# Patient Record
Sex: Female | Born: 1935 | Race: Black or African American | Hispanic: No | Marital: Married | Smoking: Never smoker
Health system: Southern US, Community
[De-identification: ages and names within clinical notes are randomized; demographics above are authoritative.]

## PROBLEM LIST (undated history)

## (undated) DIAGNOSIS — J449 Chronic obstructive pulmonary disease, unspecified: Secondary | ICD-10-CM

## (undated) DIAGNOSIS — I5020 Unspecified systolic (congestive) heart failure: Secondary | ICD-10-CM

## (undated) DIAGNOSIS — M109 Gout, unspecified: Secondary | ICD-10-CM

## (undated) DIAGNOSIS — E079 Disorder of thyroid, unspecified: Secondary | ICD-10-CM

## (undated) DIAGNOSIS — E119 Type 2 diabetes mellitus without complications: Secondary | ICD-10-CM

## (undated) DIAGNOSIS — I10 Essential (primary) hypertension: Secondary | ICD-10-CM

## (undated) DIAGNOSIS — I2699 Other pulmonary embolism without acute cor pulmonale: Secondary | ICD-10-CM

## (undated) DIAGNOSIS — N183 Chronic kidney disease, stage 3 unspecified: Secondary | ICD-10-CM

---

## 1998-12-11 ENCOUNTER — Other Ambulatory Visit: Admission: RE | Admit: 1998-12-11 | Discharge: 1998-12-11 | Payer: Self-pay

## 2000-11-16 ENCOUNTER — Encounter: Payer: Self-pay | Admitting: Emergency Medicine

## 2000-11-16 ENCOUNTER — Emergency Department (HOSPITAL_COMMUNITY): Admission: EM | Admit: 2000-11-16 | Discharge: 2000-11-16 | Payer: Self-pay | Admitting: *Deleted

## 2001-09-05 ENCOUNTER — Emergency Department (HOSPITAL_COMMUNITY): Admission: EM | Admit: 2001-09-05 | Discharge: 2001-09-05 | Payer: Self-pay | Admitting: Emergency Medicine

## 2002-12-25 ENCOUNTER — Emergency Department (HOSPITAL_COMMUNITY): Admission: EM | Admit: 2002-12-25 | Discharge: 2002-12-25 | Payer: Self-pay | Admitting: Emergency Medicine

## 2005-06-12 ENCOUNTER — Emergency Department (HOSPITAL_COMMUNITY): Admission: EM | Admit: 2005-06-12 | Discharge: 2005-06-12 | Payer: Self-pay | Admitting: Emergency Medicine

## 2007-11-21 ENCOUNTER — Emergency Department (HOSPITAL_COMMUNITY): Admission: EM | Admit: 2007-11-21 | Discharge: 2007-11-21 | Payer: Self-pay | Admitting: Emergency Medicine

## 2008-01-18 DIAGNOSIS — M51369 Other intervertebral disc degeneration, lumbar region without mention of lumbar back pain or lower extremity pain: Secondary | ICD-10-CM | POA: Insufficient documentation

## 2008-01-18 DIAGNOSIS — M5136 Other intervertebral disc degeneration, lumbar region: Secondary | ICD-10-CM | POA: Insufficient documentation

## 2008-01-18 DIAGNOSIS — M159 Polyosteoarthritis, unspecified: Secondary | ICD-10-CM | POA: Insufficient documentation

## 2008-02-02 DIAGNOSIS — L659 Nonscarring hair loss, unspecified: Secondary | ICD-10-CM | POA: Insufficient documentation

## 2008-03-22 DIAGNOSIS — D638 Anemia in other chronic diseases classified elsewhere: Secondary | ICD-10-CM | POA: Insufficient documentation

## 2008-04-07 DIAGNOSIS — G4733 Obstructive sleep apnea (adult) (pediatric): Secondary | ICD-10-CM | POA: Insufficient documentation

## 2008-05-30 DIAGNOSIS — M7580 Other shoulder lesions, unspecified shoulder: Secondary | ICD-10-CM

## 2008-05-30 DIAGNOSIS — M25819 Other specified joint disorders, unspecified shoulder: Secondary | ICD-10-CM | POA: Insufficient documentation

## 2008-06-16 DIAGNOSIS — E785 Hyperlipidemia, unspecified: Secondary | ICD-10-CM | POA: Insufficient documentation

## 2009-04-16 DIAGNOSIS — K5900 Constipation, unspecified: Secondary | ICD-10-CM | POA: Insufficient documentation

## 2009-05-02 DIAGNOSIS — D239 Other benign neoplasm of skin, unspecified: Secondary | ICD-10-CM | POA: Insufficient documentation

## 2009-05-02 DIAGNOSIS — E039 Hypothyroidism, unspecified: Secondary | ICD-10-CM | POA: Insufficient documentation

## 2009-06-17 ENCOUNTER — Emergency Department (HOSPITAL_COMMUNITY): Admission: EM | Admit: 2009-06-17 | Discharge: 2009-06-17 | Payer: Self-pay | Admitting: Family Medicine

## 2009-11-29 DIAGNOSIS — I10 Essential (primary) hypertension: Secondary | ICD-10-CM | POA: Insufficient documentation

## 2010-01-06 ENCOUNTER — Emergency Department (HOSPITAL_COMMUNITY): Admission: EM | Admit: 2010-01-06 | Discharge: 2010-01-06 | Payer: Self-pay | Admitting: Emergency Medicine

## 2010-01-06 ENCOUNTER — Ambulatory Visit: Payer: Self-pay | Admitting: Internal Medicine

## 2010-01-06 ENCOUNTER — Inpatient Hospital Stay (HOSPITAL_COMMUNITY): Admission: EM | Admit: 2010-01-06 | Discharge: 2010-01-10 | Payer: Self-pay | Admitting: Emergency Medicine

## 2010-11-12 DIAGNOSIS — E1122 Type 2 diabetes mellitus with diabetic chronic kidney disease: Secondary | ICD-10-CM | POA: Insufficient documentation

## 2010-11-12 DIAGNOSIS — N183 Chronic kidney disease, stage 3 unspecified: Secondary | ICD-10-CM | POA: Insufficient documentation

## 2011-02-23 LAB — DIFFERENTIAL
Basophils Absolute: 0 K/uL (ref 0.0–0.1)
Basophils Relative: 1 % (ref 0–1)
Eosinophils Absolute: 0.2 K/uL (ref 0.0–0.7)
Eosinophils Relative: 4 % (ref 0–5)
Lymphocytes Relative: 41 % (ref 12–46)
Lymphs Abs: 1.9 K/uL (ref 0.7–4.0)
Monocytes Absolute: 0.8 K/uL (ref 0.1–1.0)
Monocytes Relative: 18 % — ABNORMAL HIGH (ref 3–12)
Neutro Abs: 1.7 K/uL (ref 1.7–7.7)
Neutrophils Relative %: 36 % — ABNORMAL LOW (ref 43–77)

## 2011-02-23 LAB — POCT CARDIAC MARKERS
CKMB, poc: 3 ng/mL (ref 1.0–8.0)
Myoglobin, poc: 428 ng/mL (ref 12–200)
Troponin i, poc: <0.05 ng/mL (ref 0.00–0.09)

## 2011-02-23 LAB — CBC
HCT: 31.8 % — ABNORMAL LOW (ref 36.0–46.0)
HCT: 36.7 % (ref 36.0–46.0)
Hemoglobin: 10.3 g/dL — ABNORMAL LOW (ref 12.0–15.0)
Hemoglobin: 12.4 g/dL (ref 12.0–15.0)
MCHC: 32.3 g/dL (ref 30.0–36.0)
MCV: 90.3 fL (ref 78.0–100.0)
MCV: 91.3 fL (ref 78.0–100.0)
RBC: 3.48 MIL/uL — ABNORMAL LOW (ref 3.87–5.11)
RBC: 4.07 MIL/uL (ref 3.87–5.11)
WBC: 4.6 10*3/uL (ref 4.0–10.5)
WBC: 6 10*3/uL (ref 4.0–10.5)

## 2011-02-23 LAB — POCT I-STAT, CHEM 8
Chloride: 107 mEq/L (ref 96–112)
Creatinine, Ser: 2.4 mg/dL — ABNORMAL HIGH (ref 0.4–1.2)
Glucose, Bld: 108 mg/dL — ABNORMAL HIGH (ref 70–99)
HCT: 34 % — ABNORMAL LOW (ref 36.0–46.0)
Potassium: 4.8 mEq/L (ref 3.5–5.1)

## 2011-02-23 LAB — CARDIAC PANEL(CRET KIN+CKTOT+MB+TROPI)
CK, MB: 2.5 ng/mL (ref 0.3–4.0)
CK, MB: 3.1 ng/mL (ref 0.3–4.0)
Relative Index: 1.1 (ref 0.0–2.5)
Relative Index: 1.3 (ref 0.0–2.5)
Total CK: 223 U/L — ABNORMAL HIGH (ref 7–177)
Total CK: 245 U/L — ABNORMAL HIGH (ref 7–177)

## 2011-02-23 LAB — BASIC METABOLIC PANEL WITH GFR
BUN: 33 mg/dL — ABNORMAL HIGH (ref 6–23)
CO2: 27 meq/L (ref 19–32)
Calcium: 8.9 mg/dL (ref 8.4–10.5)
Chloride: 102 meq/L (ref 96–112)
Creatinine, Ser: 1.96 mg/dL — ABNORMAL HIGH (ref 0.4–1.2)
GFR calc non Af Amer: 25 mL/min — ABNORMAL LOW
Glucose, Bld: 86 mg/dL (ref 70–99)
Potassium: 4.7 meq/L (ref 3.5–5.1)
Sodium: 137 meq/L (ref 135–145)

## 2011-02-23 LAB — GLUCOSE, CAPILLARY
Glucose-Capillary: 101 mg/dL — ABNORMAL HIGH (ref 70–99)
Glucose-Capillary: 101 mg/dL — ABNORMAL HIGH (ref 70–99)
Glucose-Capillary: 103 mg/dL — ABNORMAL HIGH (ref 70–99)
Glucose-Capillary: 115 mg/dL — ABNORMAL HIGH (ref 70–99)

## 2011-02-23 LAB — CK TOTAL AND CKMB (NOT AT ARMC)
CK, MB: 4.7 ng/mL — ABNORMAL HIGH (ref 0.3–4.0)
Total CK: 307 U/L — ABNORMAL HIGH (ref 7–177)

## 2011-02-23 LAB — LIPID PANEL
Total CHOL/HDL Ratio: 1.9 RATIO
Triglycerides: 65 mg/dL (ref <150)
VLDL: 13 mg/dL (ref 0–40)

## 2011-02-23 LAB — PROTIME-INR
INR: 1.03 (ref 0.00–1.49)
Prothrombin Time: 13.4 s (ref 11.6–15.2)

## 2011-02-23 LAB — BRAIN NATRIURETIC PEPTIDE: Pro B Natriuretic peptide (BNP): <30 pg/mL (ref 0.0–100.0)

## 2011-02-23 LAB — APTT: aPTT: 23 s — ABNORMAL LOW (ref 24–37)

## 2011-02-23 LAB — TROPONIN I: Troponin I: 0.02 ng/mL (ref 0.00–0.06)

## 2011-02-23 LAB — T4, FREE: Free T4: 1.28 ng/dL (ref 0.80–1.80)

## 2011-02-26 LAB — DIFFERENTIAL
Basophils Absolute: 0 10*3/uL (ref 0.0–0.1)
Eosinophils Absolute: 0.2 10*3/uL (ref 0.0–0.7)
Eosinophils Relative: 4 % (ref 0–5)
Lymphocytes Relative: 54 % — ABNORMAL HIGH (ref 12–46)
Lymphs Abs: 2.8 10*3/uL (ref 0.7–4.0)
Neutrophils Relative %: 22 % — ABNORMAL LOW (ref 43–77)

## 2011-02-26 LAB — BASIC METABOLIC PANEL
BUN: 26 mg/dL — ABNORMAL HIGH (ref 6–23)
BUN: 28 mg/dL — ABNORMAL HIGH (ref 6–23)
Calcium: 9.3 mg/dL (ref 8.4–10.5)
Chloride: 101 mEq/L (ref 96–112)
Creatinine, Ser: 1.28 mg/dL — ABNORMAL HIGH (ref 0.4–1.2)
Creatinine, Ser: 1.4 mg/dL — ABNORMAL HIGH (ref 0.4–1.2)
GFR calc Af Amer: 49 mL/min — ABNORMAL LOW (ref >60)
GFR calc non Af Amer: 36 mL/min — ABNORMAL LOW (ref >60)
Glucose, Bld: 106 mg/dL — ABNORMAL HIGH (ref 70–99)

## 2011-02-26 LAB — CBC
MCHC: 32.6 g/dL (ref 30.0–36.0)
MCV: 91.8 fL (ref 78.0–100.0)
Platelets: 169 10*3/uL (ref 150–400)
Platelets: 185 10*3/uL (ref 150–400)
RDW: 14.8 % (ref 11.5–15.5)
WBC: 5 10*3/uL (ref 4.0–10.5)
WBC: 5.1 10*3/uL (ref 4.0–10.5)

## 2011-02-26 LAB — GLUCOSE, CAPILLARY
Glucose-Capillary: 100 mg/dL — ABNORMAL HIGH (ref 70–99)
Glucose-Capillary: 107 mg/dL — ABNORMAL HIGH (ref 70–99)
Glucose-Capillary: 107 mg/dL — ABNORMAL HIGH (ref 70–99)
Glucose-Capillary: 118 mg/dL — ABNORMAL HIGH (ref 70–99)
Glucose-Capillary: 161 mg/dL — ABNORMAL HIGH (ref 70–99)
Glucose-Capillary: 162 mg/dL — ABNORMAL HIGH (ref 70–99)

## 2011-02-26 LAB — PHOSPHORUS: Phosphorus: 3.7 mg/dL (ref 2.3–4.6)

## 2011-09-15 LAB — I-STAT 8, (EC8 V) (CONVERTED LAB)
Acid-Base Excess: 4 — ABNORMAL HIGH
BUN: 22
Chloride: 102
HCT: 39
Operator id: 235561
Potassium: 4.4

## 2011-09-15 LAB — POCT I-STAT CREATININE: Creatinine, Ser: 1.1

## 2012-07-19 DIAGNOSIS — M545 Low back pain, unspecified: Secondary | ICD-10-CM | POA: Insufficient documentation

## 2012-09-08 DIAGNOSIS — M48062 Spinal stenosis, lumbar region with neurogenic claudication: Secondary | ICD-10-CM | POA: Insufficient documentation

## 2012-11-10 DIAGNOSIS — H401131 Primary open-angle glaucoma, bilateral, mild stage: Secondary | ICD-10-CM | POA: Insufficient documentation

## 2012-11-10 DIAGNOSIS — E119 Type 2 diabetes mellitus without complications: Secondary | ICD-10-CM | POA: Insufficient documentation

## 2012-12-14 DIAGNOSIS — G629 Polyneuropathy, unspecified: Secondary | ICD-10-CM | POA: Insufficient documentation

## 2013-04-27 DIAGNOSIS — Z961 Presence of intraocular lens: Secondary | ICD-10-CM | POA: Insufficient documentation

## 2013-07-20 ENCOUNTER — Encounter (HOSPITAL_COMMUNITY): Payer: Self-pay | Admitting: Emergency Medicine

## 2013-07-20 ENCOUNTER — Emergency Department (HOSPITAL_COMMUNITY)
Admission: EM | Admit: 2013-07-20 | Discharge: 2013-07-20 | Disposition: A | Discharge status: Hospice | Payer: Medicare Other | Source: Home / Self Care | Attending: Emergency Medicine | Admitting: Emergency Medicine

## 2013-07-20 ENCOUNTER — Emergency Department (HOSPITAL_COMMUNITY)
Admission: EM | Admit: 2013-07-20 | Discharge: 2013-07-21 | Disposition: A | Discharge status: Home / Self Care | Payer: Medicare Other | Attending: Emergency Medicine | Admitting: Emergency Medicine

## 2013-07-20 ENCOUNTER — Encounter (HOSPITAL_COMMUNITY): Payer: Self-pay | Admitting: Adult Health

## 2013-07-20 DIAGNOSIS — I10 Essential (primary) hypertension: Secondary | ICD-10-CM | POA: Insufficient documentation

## 2013-07-20 DIAGNOSIS — K5900 Constipation, unspecified: Secondary | ICD-10-CM | POA: Insufficient documentation

## 2013-07-20 DIAGNOSIS — Z79899 Other long term (current) drug therapy: Secondary | ICD-10-CM | POA: Insufficient documentation

## 2013-07-20 DIAGNOSIS — R109 Unspecified abdominal pain: Secondary | ICD-10-CM

## 2013-07-20 DIAGNOSIS — R110 Nausea: Secondary | ICD-10-CM | POA: Insufficient documentation

## 2013-07-20 DIAGNOSIS — IMO0001 Reserved for inherently not codable concepts without codable children: Secondary | ICD-10-CM

## 2013-07-20 DIAGNOSIS — E119 Type 2 diabetes mellitus without complications: Secondary | ICD-10-CM | POA: Insufficient documentation

## 2013-07-20 DIAGNOSIS — R1013 Epigastric pain: Secondary | ICD-10-CM | POA: Insufficient documentation

## 2013-07-20 DIAGNOSIS — R142 Eructation: Secondary | ICD-10-CM | POA: Insufficient documentation

## 2013-07-20 DIAGNOSIS — Z7982 Long term (current) use of aspirin: Secondary | ICD-10-CM | POA: Insufficient documentation

## 2013-07-20 DIAGNOSIS — R141 Gas pain: Secondary | ICD-10-CM | POA: Insufficient documentation

## 2013-07-20 DIAGNOSIS — E079 Disorder of thyroid, unspecified: Secondary | ICD-10-CM | POA: Insufficient documentation

## 2013-07-20 HISTORY — DX: Disorder of thyroid, unspecified: E07.9

## 2013-07-20 HISTORY — DX: Type 2 diabetes mellitus without complications: E11.9

## 2013-07-20 HISTORY — DX: Essential (primary) hypertension: I10

## 2013-07-20 LAB — COMPREHENSIVE METABOLIC PANEL
ALT: 13 U/L (ref 0–35)
Alkaline Phosphatase: 29 U/L — ABNORMAL LOW (ref 39–117)
CO2: 29 mEq/L (ref 19–32)
Calcium: 9.3 mg/dL (ref 8.4–10.5)
Chloride: 100 mEq/L (ref 96–112)
GFR calc Af Amer: 30 mL/min — ABNORMAL LOW (ref >90)
GFR calc non Af Amer: 26 mL/min — ABNORMAL LOW (ref >90)
Glucose, Bld: 72 mg/dL (ref 70–99)
Sodium: 140 mEq/L (ref 135–145)
Total Bilirubin: 0.2 mg/dL — ABNORMAL LOW (ref 0.3–1.2)

## 2013-07-20 LAB — CBC WITH DIFFERENTIAL/PLATELET
Basophils Absolute: 0 10*3/uL (ref 0.0–0.1)
Basophils Relative: 0 % (ref 0–1)
Eosinophils Absolute: 0.2 10*3/uL (ref 0.0–0.7)
MCH: 29.9 pg (ref 26.0–34.0)
MCHC: 32.9 g/dL (ref 30.0–36.0)
Neutro Abs: 1.8 10*3/uL (ref 1.7–7.7)
Neutrophils Relative %: 35 % — ABNORMAL LOW (ref 43–77)
Platelets: 168 10*3/uL (ref 150–400)
RDW: 14 % (ref 11.5–15.5)

## 2013-07-20 LAB — POCT I-STAT TROPONIN I

## 2013-07-20 NOTE — ED Notes (Signed)
NURSE FIRST ROUNDS : NURSE EXPLAINED DELAY , WAIT TIME AND PROCESS , ALERT / RESPIRATIONS UNLABORED , DENIES PAIN AT THIS TIME .

## 2013-07-20 NOTE — ED Provider Notes (Addendum)
CSN: [id number]     Arrival date & time 07/20/13  41 History     First MD Initiated Contact with Patient 07/20/13 1928     Chief Complaint  Patient presents with  . Back Pain  . Abdominal Pain  . Flank Pain   (Consider location/radiation/quality/duration/timing/severity/associated sxs/prior Treatment) HPI Comments: Patient presents urgent care this evening brought in by her daughter and she's been having left-sided abdominal pain that radiates to her flank and her back since Monday. She has been taking some medicines for gases she's been having some stomach cramping and pains. During the week she also experienced some intermittent chest pains that has since resolved. But the pain on her stomach in left side of her abdomen have not improve. She denies any urinary symptoms such as increased, frequency burning or pressure. Denies any vomiting or diarrhea as  Patient is a 79 y.o. female presenting with back pain, abdominal pain, and flank pain. The history is provided by the patient.  Back Pain Location:  Lumbar spine Quality:  Aching Radiates to:  Does not radiate Pain severity:  Moderate Pain is:  Worse during the day Onset quality:  Sudden Timing:  Constant Progression:  Worsening Context: not emotional stress and not jumping from heights   Relieved by:  Nothing Ineffective treatments:  None tried Associated symptoms: abdominal pain   Associated symptoms: no bowel incontinence, no chest pain, no dysuria, no fever, no headaches, no numbness, no paresthesias, no pelvic pain, no perianal numbness, no weakness and no weight loss   Abdominal Pain Associated symptoms include abdominal pain. Pertinent negatives include no chest pain and no headaches.  Flank Pain Associated symptoms include abdominal pain. Pertinent negatives include no chest pain and no headaches.    History reviewed. No pertinent past medical history. No past surgical history on file. No family history on  file. History  Substance Use Topics  . Smoking status: Not on file  . Smokeless tobacco: Not on file  . Alcohol Use: Not on file   OB History   Grav Para Term Preterm Abortions TAB SAB Ect Mult Living                 Review of Systems  Constitutional: Negative for fever, chills, weight loss, activity change and appetite change.  Cardiovascular: Negative for chest pain.  Gastrointestinal: Positive for abdominal pain. Negative for nausea, vomiting, abdominal distention, rectal pain and bowel incontinence.  Genitourinary: Positive for flank pain. Negative for dysuria, frequency and pelvic pain.  Musculoskeletal: Positive for back pain.  Neurological: Negative for weakness, numbness, headaches and paresthesias.    Allergies  Codeine  Home Medications   Current Outpatient Rx  Name  Route  Sig  Dispense  Refill  . acetaminophen (TYLENOL) 325 MG tablet   Oral   Take 650 mg by mouth every 6 (six) hours as needed for pain.         Marland Kitchen albuterol (PROVENTIL HFA;VENTOLIN HFA) 108 (90 BASE) MCG/ACT inhaler   Inhalation   Inhale 2 puffs into the lungs every 6 (six) hours as needed for wheezing.         Marland Kitchen aspirin 81 MG tablet   Oral   Take 81 mg by mouth daily.         Marland Kitchen Brinzolamide-Brimonidine [facility] OP)   Ophthalmic   Apply to eye.         . Cholecalciferol (VITAMIN D-3) 5000 UNITS TABS   Oral   Take by mouth.         Marland Kitchen  clonazePAM (KLONOPIN) 0.5 MG tablet   Oral   Take 0.5 mg by mouth 2 (two) times daily as needed for anxiety.         Marland Kitchen doxazosin (CARDURA) 8 MG tablet   Oral   Take 8 mg by mouth at bedtime.         . DULoxetine (CYMBALTA) 30 MG capsule   Oral   Take 30 mg by mouth daily.         . furosemide (LASIX) 20 MG tablet   Oral   Take 20 mg by mouth.         . gabapentin (NEURONTIN) 100 MG capsule   Oral   Take 100 mg by mouth 3 (three) times daily.         Marland Kitchen glipiZIDE (GLUCOTROL XL) 10 MG 24 hr tablet   Oral   Take 10 mg by  mouth daily.         Marland Kitchen ketorolac (ACULAR) 0.5 % ophthalmic solution      1 drop 4 (four) times daily.         Marland Kitchen levothyroxine (SYNTHROID, LEVOTHROID) 112 MCG tablet   Oral   Take 112 mcg by mouth daily before breakfast.         . multivitamin-iron-minerals-folic acid (CENTRUM) chewable tablet   Oral   Chew 1 tablet by mouth daily.         Marland Kitchen omeprazole (PRILOSEC) 20 MG capsule   Oral   Take 20 mg by mouth daily.         . prednisoLONE acetate (PRED FORTE) 1 % ophthalmic suspension      1 drop 4 (four) times daily.         . simvastatin (ZOCOR) 20 MG tablet   Oral   Take 20 mg by mouth every evening.         . traMADol (ULTRAM) 50 MG tablet   Oral   Take 50 mg by mouth every 6 (six) hours as needed for pain.         . verapamil (VERELAN PM) 360 MG 24 hr capsule   Oral   Take 360 mg by mouth at bedtime.          There were no vitals taken for this visit. Physical Exam  Vitals reviewed. Constitutional: She appears well-nourished.  Cardiovascular: Exam reveals no gallop and no friction rub.   No murmur heard. Pulmonary/Chest: Effort normal.  Abdominal: Bowel sounds are normal. She exhibits distension. She exhibits no mass. There is tenderness. There is no rigidity, no rebound, no guarding, no CVA tenderness and negative Murphy's sign.    Skin: No rash noted. No erythema.    ED Course   Procedures (including critical care time)  Labs Reviewed - No data to display No results found. No diagnosis found.  MDM  Patient presents urgent care complaining of left-sided abdominal pain. Afebrile with a concerning exam was sent to the emergency department for further evaluation with the suspicion of complicated diverticular disease. Do not expect some amount of discomfort on deep palpation from constipation.  Rosana Hoes, MD 07/20/13 1948  Rosana Hoes, MD 07/20/13 2059

## 2013-07-20 NOTE — ED Notes (Signed)
Chart review.

## 2013-07-20 NOTE — ED Notes (Addendum)
Presents with epigastric pain that began Monday radiates in lower abdomen and around left side into left flank and upper back associated with dizziness. Pain is described as sharp and bloated Denies nausea, denies SOB. Reports constipation, last BM today.  Belching and passing gas makes pain better, nothing makes pain worse.

## 2013-07-20 NOTE — ED Notes (Signed)
C/o back pain, abd pain, and flank pain ever since Monday.  For abd pain patient took Nash and gas-x.  Patient states she thinks that she has gas that is related to back, abd, and flank pain.

## 2013-07-21 ENCOUNTER — Emergency Department (HOSPITAL_COMMUNITY): Payer: Medicare Other

## 2013-07-21 ENCOUNTER — Encounter (HOSPITAL_COMMUNITY): Payer: Self-pay | Admitting: Radiology

## 2013-07-21 MED ORDER — MILK AND MOLASSES ENEMA
Freq: Once | RECTAL | Status: AC
  Administered 2013-07-21: 04:00:00 via RECTAL
  Filled 2013-07-21: qty 250

## 2013-07-21 MED ORDER — POLYETHYLENE GLYCOL 3350 17 GM/SCOOP PO POWD: 17.0000 g | Freq: Two times a day (BID) | ORAL | Status: DC

## 2013-07-21 MED ORDER — GI COCKTAIL ~~LOC~~
30.0000 mL | Freq: Once | ORAL | Status: AC
  Administered 2013-07-21: 30 mL via ORAL
  Filled 2013-07-21: qty 30

## 2013-07-21 NOTE — ED Provider Notes (Signed)
CSN: [id number]     Arrival date & time 07/20/13  2003 History     First MD Initiated Contact with Patient 07/20/13 2349     Chief Complaint  Patient presents with  . Abdominal Pain    HPI presents with epigastric pain described as bloating, indigestion, she says she is "full of gas" she finds it difficult to belch but feels better with belching or passing gas. She tried TUMS and Maalox without help. Pain at this time is moderate. She reports constipation she had a small bowel movement today, she's recently been trying prune juice to pass her bowels. Pain is worse in the left lower abdomen radiates to the left flank.     Past Medical History  Diagnosis Date  . Diabetes mellitus without complication   . Hypertension   . Thyroid disease    History reviewed. No pertinent past surgical history. History reviewed. No pertinent family history. History  Substance Use Topics  . Smoking status: Never Smoker   . Smokeless tobacco: Not on file  . Alcohol Use: No   OB History   Grav Para Term Preterm Abortions TAB SAB Ect Mult Living                 Review of Systems At least 10pt or greater review of systems completed and are negative except where specified in the HPI.  Allergies  Codeine  Home Medications   Current Outpatient Rx  Name  Route  Sig  Dispense  Refill  . acetaminophen (TYLENOL) 325 MG tablet   Oral   Take 650 mg by mouth every 6 (six) hours as needed for pain.         Marland Kitchen aspirin 81 MG tablet   Oral   Take 81 mg by mouth daily.         . Brinzolamide-Brimonidine (SIMBRINZA OP)   Both Eyes   Place 1 drop into both eyes 3 (three) times daily.          . Calcium & Magnesium Carbonates (MYLANTA PO)   Oral   Take 30 mL by mouth daily as needed (constipation).         . Cholecalciferol (VITAMIN D-3) 5000 UNITS TABS   Oral   Take 5,000 Units by mouth daily.          . clonazePAM (KLONOPIN) 0.5 MG tablet   Oral   Take 0.5 mg by mouth 2 (two) times  daily as needed for anxiety.         Marland Kitchen doxazosin (CARDURA) 8 MG tablet   Oral   Take 8 mg by mouth daily.          . DULoxetine (CYMBALTA) 30 MG capsule   Oral   Take 30 mg by mouth daily.         . furosemide (LASIX) 20 MG tablet   Oral   Take 20 mg by mouth daily.          Marland Kitchen gabapentin (NEURONTIN) 100 MG capsule   Oral   Take 100 mg by mouth 3 (three) times daily.         Marland Kitchen glipiZIDE (GLUCOTROL XL) 10 MG 24 hr tablet   Oral   Take 10 mg by mouth daily.         Marland Kitchen ketorolac (ACULAR) 0.5 % ophthalmic solution      1 drop 4 (four) times daily.         Marland Kitchen levothyroxine (SYNTHROID, LEVOTHROID) 112 MCG tablet  Oral   Take 112 mcg by mouth daily before breakfast.         . multivitamin-iron-minerals-folic acid (CENTRUM) chewable tablet   Oral   Chew 1 tablet by mouth daily.         Marland Kitchen omeprazole (PRILOSEC) 20 MG capsule   Oral   Take 20 mg by mouth 2 (two) times daily.          . Simethicone (GAS-X PO)   Oral   Take 2 tablets by mouth 2 (two) times daily as needed (gas).         . simvastatin (ZOCOR) 20 MG tablet   Oral   Take 20 mg by mouth every evening.         . traMADol (ULTRAM) 50 MG tablet   Oral   Take 50 mg by mouth every 6 (six) hours as needed for pain.         . verapamil (VERELAN PM) 360 MG 24 hr capsule   Oral   Take 360 mg by mouth at bedtime.         Marland Kitchen albuterol (PROVENTIL HFA;VENTOLIN HFA) 108 (90 BASE) MCG/ACT inhaler   Inhalation   Inhale 2 puffs into the lungs every 6 (six) hours as needed for wheezing.          BP 138/54  Pulse 69  Temp(Src) 97.6 F (36.4 C) (Oral)  Resp 14  Ht 5\' 5"  (1.651 m)  Wt 260 lb (117.935 kg)  BMI 43.27 kg/m2  SpO2 97% Physical Exam  Nursing notes reviewed.  Electronic medical record reviewed. VITAL SIGNS:   Filed Vitals:   07/20/13 2214 07/21/13 0007 07/21/13 0129 07/21/13 0502  BP: 153/57 138/54 166/63 160/98  Pulse: 79 69 71 70  Temp: 97.9 F (36.6 C) 97.6 F (36.4 C)     TempSrc: Oral Oral    Resp: 14  20 16   Height:      Weight:      SpO2: 98% 97% 98% 99%   CONSTITUTIONAL: Awake, oriented, appears non-toxic, hard of hearing HENT: Atraumatic, normocephalic, oral mucosa pink and moist, airway patent. Nares patent without drainage. External ears normal. EYES: Conjunctiva clear, EOMI, PERRLA, left eye exotropia NECK: Trachea midline, non-tender, supple CARDIOVASCULAR: Normal heart rate, Normal rhythm, No murmurs, rubs, gallops PULMONARY/CHEST: Clear to auscultation, no rhonchi, wheezes, or rales. Symmetrical breath sounds. Non-tender. ABDOMINAL: Non-distended, morbidly obese, soft, hypertympanic, non-tender - no rebound or guarding.  BS normal. NEUROLOGIC: Non-focal, moving all four extremities, no gross sensory or motor deficits. EXTREMITIES: No clubbing, cyanosis. 2+ B/L lower extremity edema SKIN: Warm, Dry, No erythema, No rash ED Course   Procedures (including critical care time)  Date: 07/21/2013  Rate: 78  Rhythm: normal sinus rhythm  QRS Axis: Left axis deviation  Intervals: normal  ST/T Wave abnormalities: normal  Conduction Disutrbances: Right bundle branch block  Narrative Interpretation: Right bundle branch block, left axis deviation, otherwise normal sinus rhythm, no evidence of acute or infarction, no EKGs for prior comparison Labs Reviewed  COMPREHENSIVE METABOLIC PANEL - Abnormal; Notable for the following:    BUN 32 (*)    Creatinine, Ser 1.73 (*)    Albumin 3.4 (*)    Alkaline Phosphatase 29 (*)    Total Bilirubin 0.2 (*)    GFR calc non Af Amer 26 (*)    GFR calc Af Amer 30 (*)    All other components within normal limits  CBC WITH DIFFERENTIAL - Abnormal; Notable for the following:  RBC 3.24 (*)    Hemoglobin 9.7 (*)    HCT 29.5 (*)    Neutrophils Relative % 35 (*)    Lymphocytes Relative 53 (*)    All other components within normal limits  LIPASE, BLOOD  POCT I-STAT TROPONIN I   Ct Abdomen Pelvis Wo  Contrast  07/21/2013   *RADIOLOGY REPORT*  Clinical Data: Epigastric pain.  Worsening with dizziness.  CT ABDOMEN AND PELVIS WITHOUT CONTRAST  Technique:  Multidetector CT imaging of the abdomen and pelvis was performed following the standard protocol without intravenous contrast.  Comparison: None.  Findings:  BODY WALL: Unremarkable.  LOWER CHEST:  Mediastinum: Unremarkable.  Lungs/pleura: No consolidation.  ABDOMEN/PELVIS:  Liver: No focal abnormality.  Biliary: No evidence of biliary obstruction or stone.  Pancreas: Unremarkable.  Spleen: Unremarkable.  Adrenals: Unremarkable.  Kidneys and ureters: No hydronephrosis or stone. 1 cm water density lesion in the interpolar left kidney is statistically likely to represent a cyst.  Bladder: Unremarkable.  Bowel: No obstruction. Moderate stool burden.  Normal appendix.  Retroperitoneum: No mass or adenopathy.  Peritoneum: No free fluid or gas.  Reproductive: Unremarkable.  Vascular: Aortic branch vessel atherosclerosis.  OSSEOUS: Osteitis pubis and osteitis ilii condensans. Levoscoliosis with advanced degenerative disc disease in the mid lumbar spine.  IMPRESSION: 1.  No acute intra-abdominal findings. 2.  Moderate stool burden.   Original Report Authenticated By: Jorje Guild   Dg Chest 2 View  07/21/2013   *RADIOLOGY REPORT*  Clinical Data: Epigastric pain.  CHEST - 2 VIEW  Comparison: 01/06/2010.  Findings: Borderline cardiomegaly.  Tortuous and atherosclerotic thoracic aorta. Prominent right suprahilar density, likely normal vascular shadow, especially when compared with prior and contemporaneous CT scanogram.  Interstitial markings are prominent at the bases, but there is no overt edema.  No pleural effusion.  No pneumothorax or focal infiltrate.  Right shoulder arthroplasty.  No acute osseous findings.  No pneumoperitoneum.  IMPRESSION: Mild, likely chronic interstitial changes at the bases.  Negative for pneumonia or overt pulmonary edema.   Original  Report Authenticated By: Jorje Guild   1. Epigastric pain   2. Gas   3. Constipation   4. Nausea     MDM  Debbie Arnold is a 79 y.o. female with extensive medical history presenting with epigastric bloating that she says feels like gas. Obtain EKG and troponin as this patient is an elderly female troponin is negative, EKG is nonischemic, she's been having these symptoms since Tuesday so do not think this is an acute coronary syndrome. She's also not short of breath, I do not suspect a pulmonary embolism. She's not tachycardic, no history of venous thrombolic disease, no recent hemoptysis, immobilization, trauma surgery or cancer treatment.  Patient is having epigastric pain, she does have a history of constipation has a bowel movement in a few days, she's been taking prune juice without relief.    Labs reveal elevated creatinine at 1.73, looking back at the patient's medical history, she does have a history of chronic kidney disease.  No evidence for widened mediastinum, or acute pulmonary disease and chest x-ray. CT of the abdomen and pelvis shows no acute intra-abdominal findings a moderate stool burden.  The patient is likely constipated, gave patient a note in molasses enema with excellent results, patient's pain was resolved she felt much better. No evidence for acute abdominal process. Patient discharged in good condition, she was sent home with MiraLax.  The patient appears reasonably screened and/or stabilized for discharge and I  doubt any other medical condition or other [facility] requiring further screening, evaluation, or treatment in the ED exists or is present at this time prior to discharge.     Rhunette Croft, MD 07/21/13 [phone]

## 2013-07-21 NOTE — ED Notes (Signed)
Pt using restroom. Pt has to have another bowel movement.

## 2013-07-21 NOTE — ED Notes (Signed)
Pt getting dressed with assistance.

## 2013-07-21 NOTE — ED Notes (Signed)
Pt to ED for evaluation of epigastric pain that started on Monday- pt states she feels like she is "full of gas".  Has tried prune juice, Mylanta, and Gas-X at home without relief.  Abdominal tenderness noted upon palpation to left lower quadrant.  States her bowels have been slower to pass, denies any changes in urination.

## 2014-01-04 DIAGNOSIS — F411 Generalized anxiety disorder: Secondary | ICD-10-CM | POA: Insufficient documentation

## 2014-03-14 DIAGNOSIS — H1013 Acute atopic conjunctivitis, bilateral: Secondary | ICD-10-CM | POA: Insufficient documentation

## 2015-05-21 DIAGNOSIS — H5711 Ocular pain, right eye: Secondary | ICD-10-CM | POA: Insufficient documentation

## 2015-06-22 DIAGNOSIS — H209 Unspecified iridocyclitis: Secondary | ICD-10-CM | POA: Insufficient documentation

## 2015-07-03 DIAGNOSIS — H5010 Unspecified exotropia: Secondary | ICD-10-CM | POA: Insufficient documentation

## 2015-07-03 DIAGNOSIS — H1820 Unspecified corneal edema: Secondary | ICD-10-CM | POA: Insufficient documentation

## 2015-08-02 DIAGNOSIS — K219 Gastro-esophageal reflux disease without esophagitis: Secondary | ICD-10-CM | POA: Insufficient documentation

## 2016-08-05 DIAGNOSIS — J449 Chronic obstructive pulmonary disease, unspecified: Secondary | ICD-10-CM | POA: Insufficient documentation

## 2017-01-12 DIAGNOSIS — H40043 Steroid responder, bilateral: Secondary | ICD-10-CM | POA: Insufficient documentation

## 2017-05-28 DIAGNOSIS — M47816 Spondylosis without myelopathy or radiculopathy, lumbar region: Secondary | ICD-10-CM | POA: Insufficient documentation

## 2017-09-20 ENCOUNTER — Emergency Department (HOSPITAL_COMMUNITY): Payer: Medicare Other

## 2017-09-20 ENCOUNTER — Emergency Department (HOSPITAL_COMMUNITY)
Admission: EM | Admit: 2017-09-20 | Discharge: 2017-09-21 | Disposition: A | Discharge status: Home / Self Care | Payer: Medicare Other | Attending: Emergency Medicine | Admitting: Emergency Medicine

## 2017-09-20 ENCOUNTER — Encounter (HOSPITAL_COMMUNITY): Payer: Self-pay | Admitting: Emergency Medicine

## 2017-09-20 DIAGNOSIS — Z79899 Other long term (current) drug therapy: Secondary | ICD-10-CM | POA: Diagnosis not present

## 2017-09-20 DIAGNOSIS — Z7982 Long term (current) use of aspirin: Secondary | ICD-10-CM | POA: Insufficient documentation

## 2017-09-20 DIAGNOSIS — I10 Essential (primary) hypertension: Secondary | ICD-10-CM | POA: Diagnosis not present

## 2017-09-20 DIAGNOSIS — E119 Type 2 diabetes mellitus without complications: Secondary | ICD-10-CM | POA: Insufficient documentation

## 2017-09-20 DIAGNOSIS — R1013 Epigastric pain: Secondary | ICD-10-CM

## 2017-09-20 DIAGNOSIS — E785 Hyperlipidemia, unspecified: Secondary | ICD-10-CM | POA: Insufficient documentation

## 2017-09-20 DIAGNOSIS — K5903 Drug induced constipation: Secondary | ICD-10-CM | POA: Diagnosis not present

## 2017-09-20 DIAGNOSIS — R1030 Lower abdominal pain, unspecified: Secondary | ICD-10-CM | POA: Insufficient documentation

## 2017-09-20 DIAGNOSIS — R198 Other specified symptoms and signs involving the digestive system and abdomen: Secondary | ICD-10-CM | POA: Diagnosis not present

## 2017-09-20 DIAGNOSIS — R1031 Right lower quadrant pain: Secondary | ICD-10-CM

## 2017-09-20 LAB — COMPREHENSIVE METABOLIC PANEL
ALT: 21 U/L (ref 14–54)
ANION GAP: 7 (ref 5–15)
AST: 28 U/L (ref 15–41)
Albumin: 3.3 g/dL — ABNORMAL LOW (ref 3.5–5.0)
Alkaline Phosphatase: 24 U/L — ABNORMAL LOW (ref 38–126)
BUN: 26 mg/dL — ABNORMAL HIGH (ref 6–20)
CO2: 28 mmol/L (ref 22–32)
CREATININE: 1.54 mg/dL — AB (ref 0.44–1.00)
Calcium: 8.9 mg/dL (ref 8.9–10.3)
Chloride: 106 mmol/L (ref 101–111)
GFR, EST AFRICAN AMERICAN: 34 mL/min — AB (ref >60)
GFR, EST NON AFRICAN AMERICAN: 29 mL/min — AB (ref >60)
Glucose, Bld: 187 mg/dL — ABNORMAL HIGH (ref 65–99)
POTASSIUM: 4.7 mmol/L (ref 3.5–5.1)
SODIUM: 141 mmol/L (ref 135–145)
Total Bilirubin: 0.6 mg/dL (ref 0.3–1.2)
Total Protein: 6.2 g/dL — ABNORMAL LOW (ref 6.5–8.1)

## 2017-09-20 LAB — I-STAT TROPONIN, ED: Troponin i, poc: 0.01 ng/mL (ref 0.00–0.08)

## 2017-09-20 LAB — URINALYSIS, ROUTINE W REFLEX MICROSCOPIC
Bilirubin Urine: NEGATIVE
GLUCOSE, UA: NEGATIVE mg/dL
Hgb urine dipstick: NEGATIVE
Ketones, ur: NEGATIVE mg/dL
LEUKOCYTES UA: NEGATIVE
Nitrite: NEGATIVE
PROTEIN: NEGATIVE mg/dL
SPECIFIC GRAVITY, URINE: 1.011 (ref 1.005–1.030)
pH: 5 (ref 5.0–8.0)

## 2017-09-20 LAB — CBC
HCT: 29.4 % — ABNORMAL LOW (ref 36.0–46.0)
HEMOGLOBIN: 9.4 g/dL — AB (ref 12.0–15.0)
MCH: 30 pg (ref 26.0–34.0)
MCHC: 32 g/dL (ref 30.0–36.0)
MCV: 93.9 fL (ref 78.0–100.0)
PLATELETS: 200 10*3/uL (ref 150–400)
RBC: 3.13 MIL/uL — AB (ref 3.87–5.11)
RDW: 16.1 % — ABNORMAL HIGH (ref 11.5–15.5)
WBC: 4.9 10*3/uL (ref 4.0–10.5)

## 2017-09-20 LAB — LIPASE, BLOOD: LIPASE: 25 U/L (ref 11–51)

## 2017-09-20 MED ORDER — IOPAMIDOL (ISOVUE-300) INJECTION 61%
30.0000 mL | Freq: Once | INTRAVENOUS | Status: AC | PRN
  Administered 2017-09-20: 30 mL via ORAL

## 2017-09-20 MED ORDER — MORPHINE SULFATE (PF) 4 MG/ML IV SOLN: 4.0000 mg | Freq: Once | INTRAVENOUS | Status: DC

## 2017-09-20 MED ORDER — IOPAMIDOL (ISOVUE-300) INJECTION 61%
INTRAVENOUS | Status: AC
  Administered 2017-09-20: 30 mL via ORAL
  Filled 2017-09-20: qty 30

## 2017-09-20 MED ORDER — MORPHINE SULFATE (PF) 2 MG/ML IV SOLN
2.0000 mg | Freq: Once | INTRAVENOUS | Status: AC
  Administered 2017-09-20: 2 mg via INTRAVENOUS
  Filled 2017-09-20: qty 1

## 2017-09-20 MED ORDER — SODIUM CHLORIDE 0.9 % IV BOLUS (SEPSIS)
1000.0000 mL | Freq: Once | INTRAVENOUS | Status: AC
  Administered 2017-09-20: 1000 mL via INTRAVENOUS

## 2017-09-20 NOTE — ED Provider Notes (Signed)
[facility] DEPT Provider Note   CSN: [id number] Arrival date & time: 09/20/17  4235     History   Chief Complaint Chief Complaint  Patient presents with  . Abdominal Pain    HPI Debbie Arnold is a 83 y.o. female with h/o obesity, HTN, HLD, DM, CKD, chronic low back pain, constipation presents to ED for evaluation of epigastric "cutting" pain and diffuse lower abdominal pain (R>L). Pain is intermittent, worse with palpation, Ongoing for 1-2 weeks, acutely worsening over the last few days. Also reports changes to BM described as fewer, small, pudding like BMs for the last several weeks. Daughter states pt has had issues with constipation for months, has tried lactulose and magnesium without significant relief.  Last BM today, small, soft, no melena or hematochezia. She is on gabapentin, morphine IR, celexa. Last colonoscopy within 5 years, normal. No previous abdominal surgeries.   No chest pain, SOB, cough, orthopnea, dysuria, hematuria, vaginal bleeding, changes in appetite or PO intake. Chronic LE leg swelling, unchanged today. Chronic LBP, unchanged. Reports falls many months ago. Recent steroid injections not helpful. No recent falls, saddle anesthesia, b/b incontinence or retention, numbness or weakness to extremities.   HPI  Past Medical History:  Diagnosis Date  . Diabetes mellitus without complication ([place])   . Hypertension   . Thyroid disease     There are no active problems to display for this patient.   History reviewed. No pertinent surgical history.  OB History    No data available       Home Medications    Prior to Admission medications   Medication Sig Start Date End Date Taking? Authorizing Provider  acetaminophen (TYLENOL) 325 MG tablet Take 650 mg by mouth every 6 (six) hours as needed for pain.    [provider]  albuterol (PROVENTIL HFA;VENTOLIN HFA) 108 (90 BASE) MCG/ACT inhaler Inhale 2 puffs into the lungs every 6 (six) hours as needed  for wheezing.    [provider]  aspirin 81 MG tablet Take 81 mg by mouth daily.    [provider]  Brinzolamide-Brimonidine [facility] OP) Place 1 drop into both eyes 3 (three) times daily.     [provider]  Calcium & Magnesium Carbonates (MYLANTA PO) Take 30 mL by mouth daily as needed (constipation).    [provider]  Cholecalciferol (VITAMIN D-3) 5000 UNITS TABS Take 5,000 Units by mouth daily.     [provider]  clonazePAM (KLONOPIN) 0.5 MG tablet Take 0.5 mg by mouth 2 (two) times daily as needed for anxiety.    [provider]  doxazosin (CARDURA) 8 MG tablet Take 8 mg by mouth daily.     [provider]  DULoxetine (CYMBALTA) 30 MG capsule Take 30 mg by mouth daily.    [provider]  furosemide (LASIX) 20 MG tablet Take 20 mg by mouth daily.     [provider]  gabapentin (NEURONTIN) 100 MG capsule Take 100 mg by mouth 3 (three) times daily.    [provider]  glipiZIDE (GLUCOTROL XL) 10 MG 24 hr tablet Take 10 mg by mouth daily.    [provider]  ketorolac (ACULAR) 0.5 % ophthalmic solution 1 drop 4 (four) times daily.    [provider]  levothyroxine (SYNTHROID, LEVOTHROID) 112 MCG tablet Take 112 mcg by mouth daily before breakfast.    [provider]  multivitamin-iron-minerals-folic acid (CENTRUM) chewable tablet Chew 1 tablet by mouth daily.  [provider]  omeprazole (PRILOSEC) 20 MG capsule Take 20 mg by mouth 2 (two) times daily.     [provider]  polyethylene glycol powder (GLYCOLAX) powder Take 17 g by mouth 2 (two) times daily. 07/21/13   Arnold, John-Adam, MD  Simethicone (GAS-X PO) Take 2 tablets by mouth 2 (two) times daily as needed (gas).    [provider]  simvastatin (ZOCOR) 20 MG tablet Take 20 mg by mouth every evening.    [provider]  traMADol (ULTRAM) 50 MG tablet Take 50 mg by mouth every 6  (six) hours as needed for pain.    [provider]  verapamil (VERELAN PM) 360 MG 24 hr capsule Take 360 mg by mouth at bedtime.    [provider]    Family History No family history on file.  Social History Social History  Substance Use Topics  . Smoking status: Never Smoker  . Smokeless tobacco: Not on file  . Alcohol use No     Allergies   Codeine   Review of Systems Review of Systems  Constitutional: Negative for appetite change, chills and fever.  Respiratory: Negative for cough and shortness of breath.   Cardiovascular: Positive for leg swelling (chronic, unchanged). Negative for chest pain.  Gastrointestinal: Positive for abdominal pain and constipation. Negative for anal bleeding, blood in stool, diarrhea, nausea, rectal pain and vomiting.  Endocrine: Negative for polydipsia and polyuria.  Genitourinary: Negative for decreased urine volume, difficulty urinating, dysuria, hematuria, vaginal bleeding, vaginal discharge and vaginal pain.  Musculoskeletal: Positive for back pain (low back, chronic) and myalgias (low back).  Skin: Negative for rash.  Neurological: Negative for dizziness and headaches.     Physical Exam Updated Vital Signs BP 100/67 (BP Location: Left Arm)   Pulse (!) 101   Temp 98.4 F (36.9 C) (Oral)   Resp 11   Ht 5\' 5"  (1.651 m)   Wt 122 kg (269 lb)   SpO2 97%   BMI 44.76 kg/m   Physical Exam  Constitutional: She is oriented to person, place, and time. She appears well-developed and well-nourished. No distress.  Obese female. NAD.  HENT:  Head: Normocephalic and atraumatic.  Nose: Nose normal.  Mouth/Throat: No oropharyngeal exudate.  Moist mucous membranes  Eyes: Pupils are equal, round, and reactive to light. Conjunctivae and EOM are normal.  Neck: Normal range of motion. Neck supple.  Cardiovascular: Normal rate, regular rhythm, normal heart sounds and intact distal pulses.   No murmur heard. Pulses:      Radial  pulses are 2+ on the right side, and 2+ on the left side.       Dorsalis pedis pulses are 2+ on the right side, and 2+ on the left side.  No LE edema or calf tenderness  Pulmonary/Chest: Effort normal. No respiratory distress. She has decreased breath sounds in the right lower field and the left lower field. She has no wheezes. She has no rhonchi. She has no rales. She exhibits no tenderness.  Difficult exam given body habitus, decreased lung sounds in lower lobes bilaterally Normal RR and SpO2  Abdominal: Soft. Bowel sounds are normal. She exhibits no distension and no mass. There is tenderness in the right lower quadrant, suprapubic area and left lower quadrant. There is tenderness at McBurney's point.  +RLQ tenderness, +McBurney's No epigastric tenderness currently No G/R/R Decreased bowel sounds  Musculoskeletal: Normal range of motion. She exhibits no deformity.  Pt moving all four extremities without pain  Lymphadenopathy:    She has no cervical adenopathy.  Neurological: She is alert and oriented to person, place, and time. No sensory deficit.  Sensation to light touch intact in hands and feet Strength 5/5 with hand grip and ankle f/e Moving four extremities in coordinated and symmetrical fashion  Skin: Skin is warm and dry. Capillary refill takes less than 2 seconds.  Psychiatric: She has a normal mood and affect. Her behavior is normal. Judgment and thought content normal.  Nursing note and vitals reviewed.    ED Treatments / Results  Labs (all labs ordered are listed, but only abnormal results are displayed) Labs Reviewed  COMPREHENSIVE METABOLIC PANEL - Abnormal; Notable for the following:       Result Value   Glucose, Bld 187 (*)    BUN 26 (*)    Creatinine, Ser 1.54 (*)    Total Protein 6.2 (*)    Albumin 3.3 (*)    Alkaline Phosphatase 24 (*)    GFR calc non Af Amer 29 (*)    GFR calc Af Amer 34 (*)    All other components within normal limits  CBC - Abnormal;  Notable for the following:    RBC 3.13 (*)    Hemoglobin 9.4 (*)    HCT 29.4 (*)    RDW 16.1 (*)    All other components within normal limits  URINALYSIS, ROUTINE W REFLEX MICROSCOPIC - Abnormal; Notable for the following:    Color, Urine STRAW (*)    All other components within normal limits  URINE CULTURE  LIPASE, BLOOD  I-STAT TROPONIN, ED  I-STAT TROPONIN, ED    EKG  EKG Interpretation  Date/Time:  Sunday September 20 2017 18:58:06 EDT Ventricular Rate:  85 PR Interval:    QRS Duration: 170 QT Interval:  445 QTC Calculation: 501 R Axis:   -92 Text Interpretation:  Sinus rhythm Multiple ventricular premature complexes RBBB and LAFB Baseline wander Artifact When compared with ECG of 07/20/2013 Premature ventricular complexes are now Present Confirmed by Francine Graven [phone]) on 09/20/2017 7:17:46 PM       Radiology Dg Chest 2 View  Result Date: 09/20/2017 CLINICAL DATA:  Severe abdominal pain on the right just below the right diaphragm. Nausea and vomiting. Shortness of breath. Hypertension. Previous smoker. EXAM: CHEST  2 VIEW COMPARISON:  07/21/2013 FINDINGS: Cardiac enlargement without vascular congestion. No edema or consolidation. No blunting of costophrenic angles. No pneumothorax. Mediastinal contours appear intact. Calcification of the aorta. Postoperative change in the right shoulder. IMPRESSION: Cardiac enlargement.  No evidence of active pulmonary disease. Electronically Signed   By: Lucienne Capers M.D.   On: 09/20/2017 21:43    Procedures Procedures (including critical care time)  Medications Ordered in ED Medications  sodium chloride 0.9 % bolus 1,000 mL (1,000 mLs Intravenous New Bag/Given 09/20/17 2008)  morphine 2 MG/ML injection 2 mg (2 mg Intravenous Given 09/20/17 2008)  iopamidol (ISOVUE-300) 61 % injection 30 mL (30 mLs Oral Contrast Given 09/20/17 2221)     Initial Impression / Assessment and Plan / ED Course  I have reviewed the triage  vital signs and the nursing notes.  Pertinent labs & imaging results that were available during my care of the patient were reviewed by me and considered in my medical decision making (see chart for details).  Clinical Course as of Sep 20 2300  Nancy Fetter Sep 20, 2017  2111 GFR, Est African American: (!) 34 [CG]  2111 Creatinine: (!) 1.54 [CG]  2111  BUN: (!) 26 [CG]  2111 Hemoglobin: (!) 9.4 [CG]  2111 WBC: 4.9 [CG]    Clinical Course User Index [CG] Kinnie Feil, PA-C   83 year old female with history of HTN, HLD, DM, obesity, CKD, chronic back pain on opioids presents to ED with epigastric and lower abdominal pain (R>L). Recent changes to BMs over the last few weeks, smaller, looser, less frequent BMs. She does have chronic constipation from gabapentin, celexa and opioids that has not improved with recent lactulose and magnesium.   On exam, VS WNL. She has diffuse lower abdominal tenderness mostly at RLQ. No G/R/R. No epigastric tenderness currently. Given epigastric discomfort and multiple risk factors for ACS, will get belly and CP work up.   Final Clinical Impressions(s) / ED Diagnoses   Lab work remarkable for chronic anemia. Elevated creatinine, unchanged from previous. GFR 34. No leukocytosis. Lipase, LFTs, tropx1, u/a unremarkable. Given age, changes in BMs, RLQ pain will get CT scan w/ oral contrast only given GFR. IVF, analgesics started.   Pt will be handed off to oncoming EDP [place] who will f/u on CT A/P and reassess abdomen.  Patient, ED treatment and discharge plan was discussed with supervising physician who is agreeable with plan.   Final diagnoses:  Right lower quadrant abdominal pain  Epigastric abdominal pain  Change in bowel movement    New Prescriptions New Prescriptions   No medications on file     Arlean Hopping 09/20/17 [place], [place], DO 09/20/17 2313

## 2017-09-20 NOTE — ED Triage Notes (Addendum)
Patient BIB son and daughter, c/o upper abdominal pain and constipation x2 days. States last BM two days ago. Patient also c/o right side pain. Reports she got a cortisone shot and the pain has worsened since that time. Family also reports patient had a fall, causing lower back to hurt as well.

## 2017-09-21 LAB — I-STAT TROPONIN, ED: Troponin i, poc: 0.01 ng/mL (ref 0.00–0.08)

## 2017-09-21 NOTE — ED Provider Notes (Signed)
Care assumed from previous provider PA Gibbons. Please see their note for further details to include full history and physical.   Debbie Arnold is a 83 y.o. female with h/o obesity, HTN, HLD, DM, CKD, chronic low back pain, constipation presents to ED for evaluation of epigastric "cutting" pain and diffuse lower abdominal pain (R>L). Pain is intermittent, worse with palpation, Ongoing for 1-2 weeks, acutely worsening over the last few days. Also reports changes to BM described as fewer, small, pudding like BMs for the last several weeks. Daughter states pt has had issues with constipation for months, has tried lactulose and magnesium without significant relief.  Last BM today, small, soft, no melena or hematochezia. She is on gabapentin, morphine IR, celexa. Last colonoscopy within 5 years, normal. No previous abdominal surgeries.   No chest pain, SOB, cough, orthopnea, dysuria, hematuria, vaginal bleeding, changes in appetite or PO intake. Chronic LE leg swelling, unchanged today. Chronic LBP, unchanged. Reports falls many months ago. Recent steroid injections not helpful. No recent falls, saddle anesthesia, b/b incontinence or retention, numbness or weakness to extremities.     Case discussed, plan agreed upon. At time of care handoff awaiting ct scan and trop and then d/c.  IMPRESSION:  1. No CT evidence for acute intra-abdominal or pelvic process.  2. Moderate retained stool within the ascending colon, which may  reflect constipation.  3. Cardiomegaly with trace layering left pleural effusion.  4. Scoliosis with advanced degenerative spondylolysis at T12-L1  through L3-4.       Repeat troponin was negative. Low suspicion for ACS. Labs appear to be at baseline. UA shows no signs of infection.  Abdominal exam reveals no acute findings. Does show moderate retained stool recollecting constipation. Patient is on chronic opioids which is likely causing her symptoms. Patient is requesting  something to help have a bowel movement in the ED. Get did give soapsuds enema and patient had sig bowel movement. She feels much relieved. Repeat abdominal exam is benign. Encouraged continued stool softener an dMiraLAX. Will need close follow-up with PCP or GI doctor.  Pt is hemodynamically stable, in NAD, & able to ambulate in the ED. Evaluation does not show pathology that would require ongoing emergent intervention or inpatient treatment. I explained the diagnosis to the patient. Pain has been managed & has no complaints prior to dc. Pt is comfortable with above plan and is stable for discharge at this time. All questions were answered prior to disposition. Strict return precautions for f/u to the ED were discussed. Encouraged follow up with PCP.          Doristine Devoid, PA-C 09/21/17 [place], [place], DO 09/23/17 1527

## 2017-09-21 NOTE — Discharge Instructions (Signed)
ER workup and imaging has been reassuring. Your symptoms are likely caused by constipation. Her heart shows no signs of heart attack.    One cap of Miralax mixed in a glass of water 1-2 times daily until bowel movements are regular. An over the counter stool softener such as Colace or Senokat S can also be used.  Follow up with your primary physician in regard's to today's visit. Return to ER for vomiting, new or worsening symptoms, any additional concerns.   GETTING TO GOOD BOWEL HEALTH.     The goal: ONE SOFT BOWEL MOVEMENT A DAY!  To have soft, regular bowel movements:  Drink at least 8 tall glasses of water a day.   Take plenty of fiber.  Fiber is the undigested part of plant food that passes into the colon, acting s ?natures broom? to encourage bowel motility and movement.  Fiber can absorb and hold large amounts of water. This results in a larger, bulkier stool, which is soft and easier to pass. Work gradually over several weeks up to 6 servings a day of fiber (25g a day even more if needed) in the form of: Vegetables -- Root (potatoes, carrots, turnips), leafy green (lettuce, salad greens, celery, spinach), or cooked high residue (cabbage, broccoli, etc) Fruit -- Fresh (unpeeled skin & pulp), Dried (prunes, apricots, cherries, etc ),  or stewed ( applesauce)  Whole grain breads, pasta, etc (whole wheat)  Bran cereals  No reading or other relaxing activity while on the toilet. If bowel movements take longer than 5 minutes, you are too constipated

## 2017-09-22 LAB — URINE CULTURE

## 2017-11-23 DIAGNOSIS — E1169 Type 2 diabetes mellitus with other specified complication: Secondary | ICD-10-CM | POA: Insufficient documentation

## 2017-11-23 DIAGNOSIS — E785 Hyperlipidemia, unspecified: Secondary | ICD-10-CM | POA: Insufficient documentation

## 2017-11-23 DIAGNOSIS — I129 Hypertensive chronic kidney disease with stage 1 through stage 4 chronic kidney disease, or unspecified chronic kidney disease: Secondary | ICD-10-CM | POA: Insufficient documentation

## 2017-11-23 DIAGNOSIS — N184 Chronic kidney disease, stage 4 (severe): Secondary | ICD-10-CM | POA: Insufficient documentation

## 2017-11-23 DIAGNOSIS — E669 Obesity, unspecified: Secondary | ICD-10-CM | POA: Insufficient documentation

## 2017-11-24 DIAGNOSIS — E871 Hypo-osmolality and hyponatremia: Secondary | ICD-10-CM | POA: Insufficient documentation

## 2017-11-24 DIAGNOSIS — N179 Acute kidney failure, unspecified: Secondary | ICD-10-CM | POA: Insufficient documentation

## 2018-03-01 ENCOUNTER — Ambulatory Visit: Payer: Medicare Other | Admitting: Podiatry

## 2018-03-01 DIAGNOSIS — M79676 Pain in unspecified toe(s): Secondary | ICD-10-CM | POA: Diagnosis not present

## 2018-03-01 DIAGNOSIS — L84 Corns and callosities: Secondary | ICD-10-CM

## 2018-03-01 DIAGNOSIS — E0843 Diabetes mellitus due to underlying condition with diabetic autonomic (poly)neuropathy: Secondary | ICD-10-CM | POA: Diagnosis not present

## 2018-03-01 DIAGNOSIS — B351 Tinea unguium: Secondary | ICD-10-CM | POA: Diagnosis not present

## 2018-03-03 NOTE — Progress Notes (Signed)
    Subjective: Patient is a 84 y.o. female with PMHx of T2DM presenting to the office today as a new patient with a chief complaint of a painful callus lesion to the left fourth toe that has been present for several months. Applying pressure and walking increase the pain. She has not done anything to treat the symptoms.   Patient also complains of elongated, thickened nails that cause pain while ambulating in shoes. She is unable to trim her own nails. Patient presents today for further treatment and evaluation.  Past Medical History:  Diagnosis Date  . Diabetes mellitus without complication ([place])   . Hypertension   . Thyroid disease     Objective:  Physical Exam General: Alert and oriented x3 in no acute distress  Dermatology: Hyperkeratotic lesion present on the left fourth toe. Pain on palpation with a central nucleated core noted. Skin is warm, dry and supple bilateral lower extremities. Negative for open lesions or macerations. Nails are tender, long, thickened and dystrophic with subungual debris, consistent with onychomycosis, 1-5 bilateral. No signs of infection noted.  Vascular: Palpable pedal pulses bilaterally. No edema or erythema noted. Capillary refill within normal limits.  Neurological: Epicritic and protective threshold grossly intact bilaterally.   Musculoskeletal Exam: Pain on palpation at the keratotic lesion noted. Range of motion within normal limits bilateral. Muscle strength 5/5 in all groups bilateral.  Assessment: 1. Onychodystrophic nails 1-5 bilateral with hyperkeratosis of nails.  2. Onychomycosis of nail due to dermatophyte bilateral 3. Porokeratosis to the left fourth toe   Plan of Care:  #1 Patient evaluated. #2 Excisional debridement of keratoic lesion using a chisel blade was performed without incident.  #3 Dressed with light dressing. #4 Mechanical debridement of nails 1-5 bilaterally performed using a nail nipper. Filed with dremel without  incident.  #5 Patient is to return to the clinic in 3 months.   Edrick Kins, DPM [facility]  Dr. Edrick Kins, [address]                                        [place], [place] [address]                Office [phone]  Fax [phone]

## 2018-05-31 ENCOUNTER — Ambulatory Visit: Payer: Medicare Other | Admitting: Podiatry

## 2018-06-09 ENCOUNTER — Ambulatory Visit: Payer: Medicare Other | Admitting: Podiatry

## 2018-06-09 ENCOUNTER — Encounter: Payer: Self-pay | Admitting: Podiatry

## 2018-06-09 DIAGNOSIS — M79674 Pain in right toe(s): Secondary | ICD-10-CM

## 2018-06-09 DIAGNOSIS — B351 Tinea unguium: Secondary | ICD-10-CM

## 2018-06-09 DIAGNOSIS — L84 Corns and callosities: Secondary | ICD-10-CM | POA: Diagnosis not present

## 2018-06-09 DIAGNOSIS — M79675 Pain in left toe(s): Secondary | ICD-10-CM

## 2018-06-09 DIAGNOSIS — E1142 Type 2 diabetes mellitus with diabetic polyneuropathy: Secondary | ICD-10-CM

## 2018-06-12 NOTE — Progress Notes (Signed)
Subjective: Debbie Arnold is an 84 y.o. y.o. female who presents today for diabetic foot care with  cc of painful, discolored, thick toenails and painful callus/corn which interfere with activities of daily living. Pain is aggravated when wearing enclosed shoe gear. Pain is relieved with periodic professional debridement.  Objective: There were no vitals filed for this visit. Vascular Examination: Capillary refill time <3 seconds x 10 digits Dorsalis pedis and Posterior tibial pulses present b/l No digital hair x 10 digits Skin temperature gradient WNL b/l  Dermatological Examination: Skin with normal turgor, texture and tone b/l Toenails 1-5 b/l discolored, thick, dystrophic with subungual debris and pain with palpation to nailbeds due to thickness of nails. Hyperkeratotic lesion lateral 5th PIPJ right foot, dorsal DIPJ left 4th toe with tenderness to palpation. No edema, no erythema, no flocculence, no drainage nor underlying wound.  Musculoskeletal: Muscle strength 5/5 to all LE muscle groups Adductovarus deformity b/l 5th digit  Neurological: Sensation intact with 10 gram monofilament b/l Vibratory sensation intact b/l  Assessment: 1. Painful onychomycosis toenails 1-5 b/l 2.  Painful preulcerative corn dorsal 4th digit left foot, lateral 5th PIPJ right foot 3. Hammertoe b/l 5th digit 3.  NIDDM with neuropathy  Plan: 1. Continue diabetic foot care principles. Discussed diabetic shoe gear. Pt qualifies due to NIDDM with neuropathy and digital deformities with preucerative corns. Paperwork completed today. 2. Toenails 1-5 b/l were debrided in length and girth without iatrogenic bleeding. 3. Preulcerative lesion pared with sterile chisel blade left 4th toe, right 5th toe 4. Patient to continue soft, supportive shoe gear 5. Patient to report any pedal injuries to medical professional  6. Follow up 3 months.  7. Patient/POA to call should there be a concern in the interim.

## 2018-07-06 ENCOUNTER — Telehealth: Payer: Self-pay | Admitting: Podiatry

## 2018-07-06 NOTE — Telephone Encounter (Signed)
Pt scheduled to come in on 8.5.19 @ 1115

## 2018-07-06 NOTE — Telephone Encounter (Signed)
Returned call from message left by Matthias Hughs and left message for her to call me back to schedule an appt.

## 2018-07-12 ENCOUNTER — Ambulatory Visit: Payer: Medicare Other | Admitting: Orthotics

## 2018-07-12 DIAGNOSIS — B351 Tinea unguium: Secondary | ICD-10-CM

## 2018-07-12 DIAGNOSIS — M79675 Pain in left toe(s): Secondary | ICD-10-CM

## 2018-07-12 DIAGNOSIS — M79674 Pain in right toe(s): Principal | ICD-10-CM

## 2018-07-12 DIAGNOSIS — E1142 Type 2 diabetes mellitus with diabetic polyneuropathy: Secondary | ICD-10-CM

## 2018-07-12 DIAGNOSIS — L84 Corns and callosities: Secondary | ICD-10-CM

## 2018-07-12 NOTE — Progress Notes (Signed)
Patient came in today for fitting and eval for diabetic shoes: Patient' doctor here is [place] y  Patient presents with PN, HT, DM2  Patient was measured with brannock 10W device and cast in foam for custom inserts.  Patient chose [id number]

## 2018-08-03 ENCOUNTER — Ambulatory Visit: Payer: Medicare Other | Admitting: Orthotics

## 2018-08-03 DIAGNOSIS — E1142 Type 2 diabetes mellitus with diabetic polyneuropathy: Secondary | ICD-10-CM

## 2018-08-03 DIAGNOSIS — L84 Corns and callosities: Secondary | ICD-10-CM

## 2018-08-03 NOTE — Progress Notes (Signed)

## 2018-08-16 ENCOUNTER — Ambulatory Visit (INDEPENDENT_AMBULATORY_CARE_PROVIDER_SITE_OTHER): Payer: Medicare Other | Admitting: Orthotics

## 2018-08-16 DIAGNOSIS — L84 Corns and callosities: Secondary | ICD-10-CM

## 2018-08-16 DIAGNOSIS — E0843 Diabetes mellitus due to underlying condition with diabetic autonomic (poly)neuropathy: Secondary | ICD-10-CM

## 2018-08-16 DIAGNOSIS — E1142 Type 2 diabetes mellitus with diabetic polyneuropathy: Secondary | ICD-10-CM | POA: Diagnosis not present

## 2018-08-16 NOTE — Progress Notes (Signed)

## 2018-09-10 ENCOUNTER — Encounter: Payer: Self-pay | Admitting: Podiatry

## 2018-09-10 ENCOUNTER — Ambulatory Visit: Payer: Medicare Other | Admitting: Podiatry

## 2018-09-10 DIAGNOSIS — M79674 Pain in right toe(s): Secondary | ICD-10-CM | POA: Diagnosis not present

## 2018-09-10 DIAGNOSIS — L84 Corns and callosities: Secondary | ICD-10-CM

## 2018-09-10 DIAGNOSIS — B351 Tinea unguium: Secondary | ICD-10-CM

## 2018-09-10 DIAGNOSIS — L89621 Pressure ulcer of left heel, stage 1: Secondary | ICD-10-CM

## 2018-09-10 DIAGNOSIS — E1142 Type 2 diabetes mellitus with diabetic polyneuropathy: Secondary | ICD-10-CM

## 2018-09-10 DIAGNOSIS — M79675 Pain in left toe(s): Secondary | ICD-10-CM | POA: Diagnosis not present

## 2018-09-10 NOTE — Patient Instructions (Signed)
Compression Stockings: ? ?[employer] ?[address] ?[place], [place] [address] ? ?[phone]  ?

## 2018-09-12 NOTE — Progress Notes (Signed)
Subjective: Debbie Arnold is accompanied by her daughter on today.  She presents to clinic for routine foot care follow up. She has diabetic neuropathy, painful mycotic toenails and painful corn left 4th toe. Pain is aggravated when wearing enclosed shoe gear and is relieved with periodic professional debridement.  Today [name] relates painful left heel for the past few weeks. She states she sleeps on her back mostly.   She also says she picked up her diabetic shoes but she feels that they do not fit properly. She has not been wearing them.  She is also requesting a vendor where she can purchase new compression knee highs.  Objective: Vascular Examination: Capillary refill time <3 seconds x 10 digits Dorsalis pedis and Posterior tibial pulses present b/l No digital hair x 10 digits Skin temperature gradient WNL b/l  Dermatological Examination: Skin with normal turgor, texture and tone b/l Toenails 1-5 b/l discolored, thick, dystrophic with subungual debris and pain with palpation to nailbeds due to thickness of nails. Hyperkeratotic lesion dorsal 5th PIPJ right foot, dorsal  DIPJ left 4th digit with tenderness to palpation. No edema, no erythema, no flocculence, no drainage nor underlying wound.  Posterior left heel with hyperkeratosis and hyperpigmentation. There is tenderness to palpation. No flocculence, no erythema, no blistering, no edema.  Musculoskeletal: Muscle strength 5/5 to all LE muscle groups Adductovarus 5th digit b/l  Neurological: Sensation intact with 10 gram monofilament. Vibratory sensation intact  Assessment: 1. Painful onychomycosis toenails 1-5 b/l 2. Corn dorsal 5th PIPJ right foot, dorsal  DIPJ left 4th digit 3. Stage 1 Ulcer posterior left heel 4. NIDDM with Diabetic neuropathy  Plan: 1. Continue diabetic foot care principles.  2. Discussed pressure ulcer and prevention 3. Toenails 1-5 b/l were debrided in length and girth without iatrogenic  bleeding. 4. Hyperkeratotic lesion pared with sterile chisel blade x 2 5. Gently filed posterior right heel with burr. Cleaned with alcohol and applied triple antibiotic ointment. Recommended heel protectors to be worn when in bed. Daughter given information where to purchase them online. She was instructed they would have to be removed for trips to the bathroom during the night. 6. Patient advised to bring diabetic shoes into the office for evaluation. She to continue soft, supportive shoe gear such as Skechers with stretchable uppers in the meantime. 7. Patient given information where to purchase Compression Stockings: Compression Stockings: [employer] [address] [place], [place] [address] [phone] 8. Patient to report any pedal injuries to medical professional  9. Follow up 3 months. Patient/POA to call should there be a concern in the interim.

## 2019-01-13 ENCOUNTER — Ambulatory Visit: Payer: Medicare Other | Admitting: Physical Therapy

## 2019-01-18 ENCOUNTER — Ambulatory Visit: Payer: Medicare Other | Admitting: Physical Therapy

## 2019-01-20 ENCOUNTER — Encounter: Payer: Self-pay | Admitting: Physical Therapy

## 2019-01-20 ENCOUNTER — Other Ambulatory Visit: Payer: Self-pay

## 2019-01-20 ENCOUNTER — Ambulatory Visit: Payer: Medicare Other | Attending: Internal Medicine | Admitting: Physical Therapy

## 2019-01-20 DIAGNOSIS — M545 Low back pain, unspecified: Secondary | ICD-10-CM

## 2019-01-20 DIAGNOSIS — G8929 Other chronic pain: Secondary | ICD-10-CM

## 2019-01-20 DIAGNOSIS — M6281 Muscle weakness (generalized): Secondary | ICD-10-CM | POA: Diagnosis present

## 2019-01-20 DIAGNOSIS — R262 Difficulty in walking, not elsewhere classified: Secondary | ICD-10-CM

## 2019-01-20 NOTE — Therapy (Signed)
[facility] [place], [place], [address] Phone: [phone]   Fax:  [phone]  Physical Therapy Evaluation  Patient Details  Name: Debbie Arnold MRN: [id number] Date of Birth: 28-Oct-1927 Referring Provider (PT): Tempie Hoist, MD   Encounter Date: 01/20/2019  PT End of Session - 01/20/19 2012    Visit Number  1    Number of Visits  12    Date for PT Re-Evaluation  04/20/19    PT Start Time  1440    PT Stop Time  1520    PT Time Calculation (min)  40 min    Activity Tolerance  Patient limited by pain    Behavior During Therapy  [name] for tasks assessed/performed       Past Medical History:  Diagnosis Date  . Diabetes mellitus without complication ([place])   . Hypertension   . Thyroid disease     History reviewed. No pertinent surgical history.  There were no vitals filed for this visit.   Subjective Assessment - 01/20/19 2009    Subjective  Patient arrives to physical therapy with her son with reports of low back pain that began after a fall in November 2019. Patient's son reported she was leaning over a countertop praying when she fell asleep and fell flat on her back. Patient reports there was no fracture to her spine but x-ray showed arthritis. Patient reports she takes 4 tylenols per day to help with the pain and states exercises helps. She states she is independent with ADLs but reports pain with activities. Patient reports pain at worst is 8/10 and pain at best is a 5/10. Patient's goals are to decrease pain, improve strength, improve balance, and improve movement.    Patient is accompained by:  Family member   Son, [name]   Pertinent History  HTN, DM, COPD, chronic kidney disease, bilateral knee replacements, R shoulder replacement    Limitations  Sitting;House hold activities;Walking;Standing    Diagnostic tests  x-ray: arthritis per son report    Patient Stated Goals  improve movement, improve strength, better  balance    Currently in Pain?  Yes    Pain Score  6     Pain Location  Back    Pain Orientation  Left;Lower    Pain Descriptors / Indicators  Sore;Tender    Pain Type  Chronic pain    Pain Onset  More than a month ago    Pain Frequency  Constant    Aggravating Factors   "pushing on the back"    Pain Relieving Factors  "medicine, exercise"         [facility] PT Assessment - 01/20/19 0001      Assessment   Medical Diagnosis  Chronic bilateral low back pain without sciatica    Referring Provider (PT)  Tempie Hoist, MD    Onset Date/Surgical Date  --   November 2019   Prior Therapy  yes      Precautions   Precautions  Fall      Restrictions   Weight Bearing Restrictions  No      Balance Screen   Has the patient fallen in the past 6 months  Yes    How many times?  1    Has the patient had a decrease in activity level because of a fear of falling?   No    Is the patient reluctant to leave their home because of a fear of falling?   No  Home Environment   Living Environment  Private residence    Living Arrangements  Children      Prior Function   Level of Independence  Independent with basic ADLs      ROM / Strength   AROM / PROM / Strength  Strength      Strength   Strength Assessment Site  Hip;Knee    Right/Left Hip  Right;Left    Right Hip Flexion  3/5    Left Hip Flexion  3-/5    Right/Left Knee  Right;Left    Right Knee Flexion  4-/5    Right Knee Extension  4/5    Left Knee Flexion  4-/5    Left Knee Extension  4/5      Palpation   Palpation comment  Tenderness to palpation to lower lumbar spinous processes, left SIJ and upper gluteals and QL      Ambulation/Gait   Assistive device  Straight cane    Gait Pattern  Step-through pattern;Decreased step length - right;Decreased step length - left;Decreased stride length;Antalgic;Wide base of support                Objective measurements completed on examination: See above findings.               PT Education - 01/20/19 2011    Education Details  draw ins, marching, lower trunk rotations    Person(s) Educated  Patient;Child(ren)    Methods  Explanation;Demonstration;Handout;Verbal cues    Comprehension  Verbalized understanding;Returned demonstration          PT Long Term Goals - 01/20/19 2016      PT LONG TERM GOAL #1   Title  Patient will be independent with HEP    Time  6    Period  Weeks    Status  New      PT LONG TERM GOAL #2   Title  Patient will report ability to perform ADLs with low back pain less than 3/10    Time  6    Period  Weeks    Status  New      PT LONG TERM GOAL #3   Title  Patient will demonstrate 4/5 LE strength to improve stability during functional tasks.    Time  6    Period  Weeks    Status  New             Plan - 01/20/19 2014    Clinical Impression Statement  Patient is an 85 year old female who presents to physical therapy with low back pain, decreased bilateral LE strength and decreased balance. Patient's modified 5x sit to stand time of 28 seconds categorizes her as a fall risk with decreased functional LE strength. Patient ambulates with decreased gait speed, wide base of support, and decreased stride length with a straight cane. Patient noted with tenderness to palpation to lower lumbar spinous processes, as well as in left SIJ and upper gluteals. Patient demonstrates a long sit to supine transition and requires min A to transition from supine to sit. Patient would benefit from skilled physical therapy address deficits and address patient's goals.     History and Personal Factors relevant to plan of care:  HTN, DM, COPD    Clinical Presentation  Evolving    Clinical Presentation due to:  not improving    Clinical Decision Making  Moderate    Rehab Potential  Good    PT Frequency  2x / week  PT Duration  4 weeks    PT Treatment/Interventions  ADLs/Self Care Home Management;Electrical  Stimulation;Ultrasound;Cryotherapy;Moist Heat;Iontophoresis 4mg /ml Dexamethasone;Gait training;Stair training;Functional mobility training;Neuromuscular re-education;Manual techniques;Passive range of motion;Balance training;Therapeutic exercise;Therapeutic activities;Patient/family education    PT Next Visit Plan  Assess transitioning in/out of car; Nustep, LE strengthening, core exercises, Modalities PRN for pain relief    PT Home Exercise Plan  see patient education section    Consulted and Agree with Plan of Care  Patient;Family member/caregiver    Family Member Consulted  Son       Patient will benefit from skilled therapeutic intervention in order to improve the following deficits and impairments:  Abnormal gait, Pain, Decreased balance, Difficulty walking, Decreased strength, Decreased range of motion, Decreased endurance, Decreased activity tolerance, Postural dysfunction  Visit Diagnosis: Chronic low back pain, unspecified back pain laterality, unspecified whether sciatica present - Plan: PT plan of care cert/re-cert  Muscle weakness (generalized) - Plan: PT plan of care cert/re-cert  Difficulty in walking, not elsewhere classified - Plan: PT plan of care cert/re-cert     Problem List Patient Active Problem List   Diagnosis Date Noted  . Acute kidney injury ([place]) 11/24/2017  . Hyponatremia 11/24/2017  . Benign hypertension with CKD (chronic kidney disease) stage IV ([place]) 11/23/2017  . CKD (chronic kidney disease) stage 4, GFR 15-29 ml/min (HCC) 11/23/2017  . Diabetes mellitus type 2 in obese ([place]) 11/23/2017  . Hyperlipidemia with target LDL less than 100 11/23/2017  . Lumbar facet arthropathy 05/28/2017  . Steroid responder, bilateral 01/12/2017  . COPD (chronic obstructive pulmonary disease) ([place]) 08/05/2016  . Gastroesophageal reflux disease without esophagitis 08/02/2015  . Laryngopharyngeal reflux 08/02/2015  . Corneal edema 07/03/2015  . XT (exotropia) 07/03/2015  .  Anterior uveitis 06/22/2015  . Pain of right eye 05/21/2015  . Allergic conjunctivitis of both eyes 03/14/2014  . Anxiety, generalized 01/04/2014  . Pseudophakia of both eyes 04/27/2013  . Obesity, Class III, BMI 40-49.9 (morbid obesity) ([place]) 04/13/2013  . Peripheral neuropathy 12/14/2012  . Diabetes ([place]) 11/10/2012  . Primary open angle glaucoma of both eyes, mild stage 11/10/2012  . Spinal stenosis of lumbar region with neurogenic claudication 09/08/2012  . Low back pain 07/19/2012  . Diabetes mellitus with stage 3 chronic kidney disease ([place]) 11/12/2010  . Essential hypertension 11/29/2009  . Benign neoplasm of skin 05/02/2009  . Hypothyroidism 05/02/2009  . Constipation 04/16/2009  . Dyslipidemia 06/16/2008  . Other affections of shoulder region, not elsewhere classified 05/30/2008  . Obstructive sleep apnea syndrome 04/07/2008  . Anemia, chronic disease 03/22/2008  . Alopecia 02/02/2008  . DDD (degenerative disc disease), lumbar 01/18/2008  . Osteoarthritis of multiple joints 01/18/2008    Gabriela Eves, PT, DPT 01/20/2019, 8:35 PM  [facility] [place], [place], [address] Phone: [phone]   Fax:  [phone]  Name: Debbie Arnold MRN: [id number] Date of Birth: 1927-06-02

## 2019-01-27 ENCOUNTER — Encounter: Payer: Self-pay | Admitting: Physical Therapy

## 2019-01-27 ENCOUNTER — Ambulatory Visit: Payer: Medicare Other | Admitting: Physical Therapy

## 2019-01-27 DIAGNOSIS — G8929 Other chronic pain: Secondary | ICD-10-CM

## 2019-01-27 DIAGNOSIS — M6281 Muscle weakness (generalized): Secondary | ICD-10-CM

## 2019-01-27 DIAGNOSIS — M545 Low back pain, unspecified: Secondary | ICD-10-CM

## 2019-01-27 DIAGNOSIS — R262 Difficulty in walking, not elsewhere classified: Secondary | ICD-10-CM

## 2019-01-27 NOTE — Therapy (Signed)
[facility] [place], [place], [address] Phone: [phone]   Fax:  [phone]  Physical Therapy Treatment  Patient Details  Name: Debbie Arnold MRN: [id number] Date of Birth: January 24, 1927 Referring Provider (PT): Tempie Hoist, MD   Encounter Date: 01/27/2019  PT End of Session - 01/27/19 1608    Visit Number  2    Number of Visits  12    Date for PT Re-Evaluation  04/20/19    PT Start Time  1600    PT Stop Time  1659    PT Time Calculation (min)  59 min    Activity Tolerance  Patient tolerated treatment well    Behavior During Therapy  [facility] for tasks assessed/performed       Past Medical History:  Diagnosis Date  . Diabetes mellitus without complication ([place])   . Hypertension   . Thyroid disease     History reviewed. No pertinent surgical history.  There were no vitals filed for this visit.  Subjective Assessment - 01/27/19 1607    Subjective  Patien reports feeling alright today.     Patient is accompained by:  Family member    Pertinent History  HTN, DM, COPD, chronic kidney disease, bilateral knee replacements, R shoulder replacement    Limitations  Sitting;House hold activities;Walking;Standing    Diagnostic tests  x-ray: arthritis per son report    Patient Stated Goals  improve movement, improve strength, better balance    Currently in Pain?  Yes   "not much"   Pain Location  Back    Pain Orientation  Left;Lower    Pain Descriptors / Indicators  Sore;Tender    Pain Type  Chronic pain    Pain Onset  More than a month ago    Pain Frequency  Constant         [facility] PT Assessment - 01/27/19 0001      Assessment   Medical Diagnosis  Chronic bilateral low back pain without sciatica    Referring Provider (PT)  Tempie Hoist, MD    Prior Therapy  yes      Precautions   Precautions  Fall      Restrictions   Weight Bearing Restrictions  No                   [facility] Adult PT  Treatment/Exercise - 01/27/19 0001      Exercises   Exercises  Lumbar      Lumbar Exercises: Aerobic   Nustep  level 3 x10 mins       Lumbar Exercises: Seated   Other Seated Lumbar Exercises  draw in x10 3" hold    Other Seated Lumbar Exercises  marching with draw in x10      Modalities   Modalities  Electrical Stimulation;Moist Heat;Ultrasound      Moist Heat Therapy   Number Minutes Moist Heat  15 Minutes    Moist Heat Location  Lumbar Spine      Electrical Stimulation   Electrical Stimulation Location  bilateral low back    Electrical Stimulation Action  IFC    Electrical Stimulation Parameters  80-150 hz x15 mins    Electrical Stimulation Goals  Pain      Ultrasound   Ultrasound Location  bilateral lumbar paraspinals    Ultrasound Parameters  1.5 w/cm2, 100%  108mhz x12 mins    Ultrasound Goals  Pain  PT Long Term Goals - 01/20/19 2016      PT LONG TERM GOAL #1   Title  Patient will be independent with HEP    Time  6    Period  Weeks    Status  New      PT LONG TERM GOAL #2   Title  Patient will report ability to perform ADLs with low back pain less than 3/10    Time  6    Period  Weeks    Status  New      PT LONG TERM GOAL #3   Title  Patient will demonstrate 4/5 LE strength to improve stability during functional tasks.    Time  6    Period  Weeks    Status  New            Plan - 01/27/19 1649    Clinical Impression Statement  Patient was able to tolerate treatment well. Patient required min assist at left LE to raise leg on/off nustep. Patient denied pain with ultrasound. Patient educated on ultrasound as well as e-stim to decrease pain. Patient reported understanding. Patient and PT reviewed HEP and required verbal and tactile cue proper draw in technique. Normal response to modalities upon removal.     Clinical Presentation  Evolving    Clinical Decision Making  Moderate    Rehab Potential  Good    PT Frequency  2x /  week    PT Duration  4 weeks    PT Treatment/Interventions  ADLs/Self Care Home Management;Electrical Stimulation;Ultrasound;Cryotherapy;Moist Heat;Iontophoresis 4mg /ml Dexamethasone;Gait training;Stair training;Functional mobility training;Neuromuscular re-education;Manual techniques;Passive range of motion;Balance training;Therapeutic exercise;Therapeutic activities;Patient/family education    PT Next Visit Plan  Assess transitioning in/out of car; Nustep, LE strengthening, core exercises, Modalities PRN for pain relief    PT Home Exercise Plan  see patient education section    Consulted and Agree with Plan of Care  Patient;Family member/caregiver    Family Member Consulted  Son       Patient will benefit from skilled therapeutic intervention in order to improve the following deficits and impairments:  Abnormal gait, Pain, Decreased balance, Difficulty walking, Decreased strength, Decreased range of motion, Decreased endurance, Decreased activity tolerance, Postural dysfunction  Visit Diagnosis: Chronic low back pain, unspecified back pain laterality, unspecified whether sciatica present  Muscle weakness (generalized)  Difficulty in walking, not elsewhere classified     Problem List Patient Active Problem List   Diagnosis Date Noted  . Acute kidney injury ([place]) 11/24/2017  . Hyponatremia 11/24/2017  . Benign hypertension with CKD (chronic kidney disease) stage IV ([place]) 11/23/2017  . CKD (chronic kidney disease) stage 4, GFR 15-29 ml/min (HCC) 11/23/2017  . Diabetes mellitus type 2 in obese ([place]) 11/23/2017  . Hyperlipidemia with target LDL less than 100 11/23/2017  . Lumbar facet arthropathy 05/28/2017  . Steroid responder, bilateral 01/12/2017  . COPD (chronic obstructive pulmonary disease) ([place]) 08/05/2016  . Gastroesophageal reflux disease without esophagitis 08/02/2015  . Laryngopharyngeal reflux 08/02/2015  . Corneal edema 07/03/2015  . XT (exotropia) 07/03/2015  .  Anterior uveitis 06/22/2015  . Pain of right eye 05/21/2015  . Allergic conjunctivitis of both eyes 03/14/2014  . Anxiety, generalized 01/04/2014  . Pseudophakia of both eyes 04/27/2013  . Obesity, Class III, BMI 40-49.9 (morbid obesity) ([place]) 04/13/2013  . Peripheral neuropathy 12/14/2012  . Diabetes ([place]) 11/10/2012  . Primary open angle glaucoma of both eyes, mild stage 11/10/2012  . Spinal stenosis of lumbar region with neurogenic  claudication 09/08/2012  . Low back pain 07/19/2012  . Diabetes mellitus with stage 3 chronic kidney disease ([place]) 11/12/2010  . Essential hypertension 11/29/2009  . Benign neoplasm of skin 05/02/2009  . Hypothyroidism 05/02/2009  . Constipation 04/16/2009  . Dyslipidemia 06/16/2008  . Other affections of shoulder region, not elsewhere classified 05/30/2008  . Obstructive sleep apnea syndrome 04/07/2008  . Anemia, chronic disease 03/22/2008  . Alopecia 02/02/2008  . DDD (degenerative disc disease), lumbar 01/18/2008  . Osteoarthritis of multiple joints 01/18/2008   Gabriela Eves, PT, DPT 01/27/2019, 5:18 PM  [facility] [address] [place], [place], [address] Phone: [phone]   Fax:  [phone]  Name: Debbie Arnold MRN: [id number] Date of Birth: 27-Feb-1927

## 2019-02-01 ENCOUNTER — Ambulatory Visit: Payer: Medicare Other | Admitting: Physical Therapy

## 2019-02-03 ENCOUNTER — Encounter: Payer: Self-pay | Admitting: Physical Therapy

## 2019-02-03 ENCOUNTER — Ambulatory Visit: Payer: Medicare Other | Admitting: Physical Therapy

## 2019-02-03 DIAGNOSIS — M545 Low back pain, unspecified: Secondary | ICD-10-CM

## 2019-02-03 DIAGNOSIS — G8929 Other chronic pain: Secondary | ICD-10-CM

## 2019-02-03 DIAGNOSIS — M6281 Muscle weakness (generalized): Secondary | ICD-10-CM

## 2019-02-03 DIAGNOSIS — R262 Difficulty in walking, not elsewhere classified: Secondary | ICD-10-CM

## 2019-02-03 NOTE — Therapy (Signed)
[facility] [place], [place], [address] Phone: [phone]   Fax:  [phone]  Physical Therapy Treatment  Patient Details  Name: Debbie Arnold MRN: [id number] Date of Birth: [birth year] Referring Provider (PT): Tempie Hoist, MD   Encounter Date: 02/03/2019  PT End of Session - 02/03/19 1733    Visit Number  3    Number of Visits  12    Date for PT Re-Evaluation  04/20/19    PT Start Time  9242   late arrival   PT Stop Time  1748    PT Time Calculation (min)  50 min    Activity Tolerance  Patient tolerated treatment well    Behavior During Therapy  [facility] for tasks assessed/performed       Past Medical History:  Diagnosis Date  . Diabetes mellitus without complication ([place])   . Hypertension   . Thyroid disease     History reviewed. No pertinent surgical history.  There were no vitals filed for this visit.  Subjective Assessment - 02/03/19 1732    Subjective  Patient reports ongoing pain but reported having some problems with her bowels that she believes is causing more back pain    Patient is accompained by:  Family member    Pertinent History  HTN, DM, COPD, chronic kidney disease, bilateral knee replacements, R shoulder replacement    Limitations  Sitting;House hold activities;Walking;Standing    Diagnostic tests  x-ray: arthritis per son report    Patient Stated Goals  improve movement, improve strength, better balance    Currently in Pain?  Yes    Pain Score  6     Pain Location  Back    Pain Orientation  Left;Lower    Pain Descriptors / Indicators  Sore;Tender    Pain Type  Chronic pain    Pain Onset  More than a month ago    Pain Frequency  Constant         [facility] PT Assessment - 02/03/19 0001      Assessment   Medical Diagnosis  Chronic bilateral low back pain without sciatica    Referring Provider (PT)  Tempie Hoist, MD    Prior Therapy  yes      Precautions   Precautions  Fall       Restrictions   Weight Bearing Restrictions  No                   [facility] Adult PT Treatment/Exercise - 02/03/19 0001      Transfers   Transfer Cueing  car transfers to safely enter/exit; verbal and tactile cues to sit first then rotate legs in and out. seat reclined to compensate for limited left knee flexion      Exercises   Exercises  Lumbar      Modalities   Modalities  Electrical Stimulation;Moist Heat;Ultrasound      Moist Heat Therapy   Number Minutes Moist Heat  51 Minutes    Moist Heat Location  Lumbar Spine      Electrical Stimulation   Electrical Stimulation Location  bilateral low back    Electrical Stimulation Action  IFC    Electrical Stimulation Parameters  80-150 hz x15 min    Electrical Stimulation Goals  Pain      Ultrasound   Ultrasound Location  bilateral lumbar paraspinals    Ultrasound Parameters  1.5w/cm2, 100%, 58mhz x10 mins    Ultrasound Goals  Pain  Manual Therapy   Manual Therapy  Soft tissue mobilization    Soft tissue mobilization  STW/M to lumbar paraspinals with noted with increased tone in left lumbar paraspinal and QL to decrease pain and tone                  PT Long Term Goals - 01/20/19 2016      PT LONG TERM GOAL #1   Title  Patient will be independent with HEP    Time  6    Period  Weeks    Status  New      PT LONG TERM GOAL #2   Title  Patient will report ability to perform ADLs with low back pain less than 3/10    Time  6    Period  Weeks    Status  New      PT LONG TERM GOAL #3   Title  Patient will demonstrate 4/5 LE strength to improve stability during functional tasks.    Time  6    Period  Weeks    Status  New            Plan - 02/03/19 1733    Clinical Impression Statement  Due to late arrival, shortened treatment was provided. Patient was able to tolerate treatment well. Patient denied pain with [place]. Patient noted with increased muscle tone in L lumbar paraspinals and left QL and only  was able to tolerate light STW/M. Patient instructed to follow up with primary physician regarding bowel problems. Patient reported understanding. Patient assessed with car transitions per son request and patient noted with limited left knee flexion AROM to bend the knee to get in. Patient also instructed sit back into the seat rather than step into the car then sit for safety. Patient's seat was also reclined to allow patient to sit farther back to compensate for limited left knee flexion. Patient and patient's son reported understanding.     Clinical Presentation  Evolving    Clinical Decision Making  Moderate    Rehab Potential  Good    PT Frequency  2x / week    PT Duration  4 weeks    PT Treatment/Interventions  ADLs/Self Care Home Management;Electrical Stimulation;Ultrasound;Cryotherapy;Moist Heat;Iontophoresis 4mg /ml Dexamethasone;Gait training;Stair training;Functional mobility training;Neuromuscular re-education;Manual techniques;Passive range of motion;Balance training;Therapeutic exercise;Therapeutic activities;Patient/family education    PT Next Visit Plan  Nustep, LE strengthening, core exercises, Modalities PRN for pain relief    Consulted and Agree with Plan of Care  Patient    Family Member Consulted  Son       Patient will benefit from skilled therapeutic intervention in order to improve the following deficits and impairments:  Abnormal gait, Pain, Decreased balance, Difficulty walking, Decreased strength, Decreased range of motion, Decreased endurance, Decreased activity tolerance, Postural dysfunction  Visit Diagnosis: Chronic low back pain, unspecified back pain laterality, unspecified whether sciatica present  Muscle weakness (generalized)  Difficulty in walking, not elsewhere classified     Problem List Patient Active Problem List   Diagnosis Date Noted  . Acute kidney injury ([place]) 11/24/2017  . Hyponatremia 11/24/2017  . Benign hypertension with CKD (chronic  kidney disease) stage IV ([place]) 11/23/2017  . CKD (chronic kidney disease) stage 4, GFR 15-29 ml/min (HCC) 11/23/2017  . Diabetes mellitus type 2 in obese ([place]) 11/23/2017  . Hyperlipidemia with target LDL less than 100 11/23/2017  . Lumbar facet arthropathy 05/28/2017  . Steroid responder, bilateral 01/12/2017  . COPD (chronic obstructive pulmonary disease) (  [place]) 08/05/2016  . Gastroesophageal reflux disease without esophagitis 08/02/2015  . Laryngopharyngeal reflux 08/02/2015  . Corneal edema 07/03/2015  . XT (exotropia) 07/03/2015  . Anterior uveitis 06/22/2015  . Pain of right eye 05/21/2015  . Allergic conjunctivitis of both eyes 03/14/2014  . Anxiety, generalized 01/04/2014  . Pseudophakia of both eyes 04/27/2013  . Obesity, Class III, BMI 40-49.9 (morbid obesity) ([place]) 04/13/2013  . Peripheral neuropathy 12/14/2012  . Diabetes ([place]) 11/10/2012  . Primary open angle glaucoma of both eyes, mild stage 11/10/2012  . Spinal stenosis of lumbar region with neurogenic claudication 09/08/2012  . Low back pain 07/19/2012  . Diabetes mellitus with stage 3 chronic kidney disease ([place]) 11/12/2010  . Essential hypertension 11/29/2009  . Benign neoplasm of skin 05/02/2009  . Hypothyroidism 05/02/2009  . Constipation 04/16/2009  . Dyslipidemia 06/16/2008  . Other affections of shoulder region, not elsewhere classified 05/30/2008  . Obstructive sleep apnea syndrome 04/07/2008  . Anemia, chronic disease 03/22/2008  . Alopecia 02/02/2008  . DDD (degenerative disc disease), lumbar 01/18/2008  . Osteoarthritis of multiple joints 01/18/2008    Gabriela Eves, PT, DPT 02/03/2019, 6:06 PM  [facility] [address] [place], [place], [address] Phone: [phone]   Fax:  [phone]  Name: Debbie Arnold MRN: [id number] Date of Birth: 1927/04/02

## 2019-02-10 ENCOUNTER — Encounter: Payer: Self-pay | Admitting: Physical Therapy

## 2019-02-10 ENCOUNTER — Ambulatory Visit: Payer: Medicare Other | Attending: Internal Medicine | Admitting: Physical Therapy

## 2019-02-10 DIAGNOSIS — M6281 Muscle weakness (generalized): Secondary | ICD-10-CM

## 2019-02-10 DIAGNOSIS — G8929 Other chronic pain: Secondary | ICD-10-CM

## 2019-02-10 DIAGNOSIS — M545 Low back pain, unspecified: Secondary | ICD-10-CM

## 2019-02-10 DIAGNOSIS — R262 Difficulty in walking, not elsewhere classified: Secondary | ICD-10-CM | POA: Diagnosis present

## 2019-02-10 NOTE — Therapy (Addendum)
[facility] [place], [place], [address] Phone: [phone]   Fax:  [phone]  Physical Therapy Treatment PHYSICAL THERAPY DISCHARGE SUMMARY  Visits from Start of Care: 4  Current functional level related to goals / functional outcomes: See below   Remaining deficits: See goals   Education / Equipment: HEP Plan: Patient agrees to discharge.  Patient goals were not met. Patient is being discharged due to not returning since the last visit.  ?????  Debbie Arnold, PT, DPT    Patient Details  Name: Debbie Arnold MRN: [id number] Date of Birth: Dec 17, 1926 Referring Provider (PT): Tempie Hoist, MD   Encounter Date: 02/10/2019  PT End of Session - 02/10/19 1652    Visit Number  4    Number of Visits  12    Date for PT Re-Evaluation  04/20/19    PT Start Time  2992    PT Stop Time  1738    PT Time Calculation (min)  48 min    Activity Tolerance  Patient tolerated treatment well    Behavior During Therapy  [facility] for tasks assessed/performed       Past Medical History:  Diagnosis Date  . Diabetes mellitus without complication ([place])   . Hypertension   . Thyroid disease     History reviewed. No pertinent surgical history.  There were no vitals filed for this visit.  Subjective Assessment - 02/10/19 1648    Subjective  Reports pain more with standing and walking.    Patient is accompained by:  Family member   Son   Pertinent History  HTN, DM, COPD, chronic kidney disease, bilateral knee replacements, R shoulder replacement    Limitations  Sitting;House hold activities;Walking;Standing    Diagnostic tests  x-ray: arthritis per son report    Patient Stated Goals  improve movement, improve strength, better balance    Currently in Pain?  Yes    Pain Score  7     Pain Location  Back    Pain Orientation  Lower    Pain Descriptors / Indicators  Discomfort    Pain Type  Chronic pain    Pain Onset  More than a  month ago         [facility] PT Assessment - 02/10/19 0001      Assessment   Medical Diagnosis  Chronic bilateral low back pain without sciatica    Referring Provider (PT)  Tempie Hoist, MD    Prior Therapy  yes      Precautions   Precautions  Fall      Restrictions   Weight Bearing Restrictions  No                   [facility] Adult PT Treatment/Exercise - 02/10/19 0001      Lumbar Exercises: Stretches   Lower Trunk Rotation  5 reps;10 seconds      Lumbar Exercises: Aerobic   Nustep  L3 x15 min      Lumbar Exercises: Supine   Ab Set  10 reps;5 seconds    Bent Knee Raise  10 reps;2 seconds      Modalities   Modalities  Electrical Stimulation;Moist Heat      Moist Heat Therapy   Number Minutes Moist Heat  15 Minutes    Moist Heat Location  Lumbar Spine      Electrical Stimulation   Electrical Stimulation Location  B low back    Electrical Stimulation Action  Pre-Mod  Electrical Stimulation Parameters  80-150 hz x15 min    Electrical Stimulation Goals  Pain                  PT Long Term Goals - 01/20/19 2016      PT LONG TERM GOAL #1   Title  Patient will be independent with HEP    Time  6    Period  Weeks    Status  New      PT LONG TERM GOAL #2   Title  Patient will report ability to perform ADLs with low back pain less than 3/10    Time  6    Period  Weeks    Status  New      PT LONG TERM GOAL #3   Title  Patient will demonstrate 4/5 LE strength to improve stability during functional tasks.    Time  6    Period  Weeks    Status  New            Plan - 02/10/19 1753    Clinical Impression Statement  Patient presented in clinic reporting overall that her back felt better but still rated as 7/10. Patient introduced to more core strengthening exercises without complaint of any increased LBP. Patient and son educated regarding proper technique for LTR as patient's son reported that they were unsure of the technique. Min tactile  cues provided but max VCs provided to ensure proper technique for patient and son. Max VCs also provided for supine core strengthening for technique education as well as to avoid valsalva maneuver. Normal modalities response noted following removal of the modalities.    Rehab Potential  Good    PT Frequency  2x / week    PT Duration  4 weeks    PT Treatment/Interventions  ADLs/Self Care Home Management;Electrical Stimulation;Ultrasound;Cryotherapy;Moist Heat;Iontophoresis '4mg'$ /ml Dexamethasone;Gait training;Stair training;Functional mobility training;Neuromuscular re-education;Manual techniques;Passive range of motion;Balance training;Therapeutic exercise;Therapeutic activities;Patient/family education    PT Next Visit Plan  Nustep, LE strengthening, core exercises, Modalities PRN for pain relief    PT Home Exercise Plan  see patient education section    Consulted and Agree with Plan of Care  Patient    Family Member Consulted  Son       Patient will benefit from skilled therapeutic intervention in order to improve the following deficits and impairments:  Abnormal gait, Pain, Decreased balance, Difficulty walking, Decreased strength, Decreased range of motion, Decreased endurance, Decreased activity tolerance, Postural dysfunction  Visit Diagnosis: Chronic low back pain, unspecified back pain laterality, unspecified whether sciatica present  Muscle weakness (generalized)  Difficulty in walking, not elsewhere classified     Problem List Patient Active Problem List   Diagnosis Date Noted  . Acute kidney injury ([place]) 11/24/2017  . Hyponatremia 11/24/2017  . Benign hypertension with CKD (chronic kidney disease) stage IV ([place]) 11/23/2017  . CKD (chronic kidney disease) stage 4, GFR 15-29 ml/min ([facility]) 11/23/2017  . Diabetes mellitus type 2 in obese ([place]) 11/23/2017  . Hyperlipidemia with target LDL less than 100 11/23/2017  . Lumbar facet arthropathy 05/28/2017  . Steroid responder,  bilateral 01/12/2017  . COPD (chronic obstructive pulmonary disease) ([place]) 08/05/2016  . Gastroesophageal reflux disease without esophagitis 08/02/2015  . Laryngopharyngeal reflux 08/02/2015  . Corneal edema 07/03/2015  . XT (exotropia) 07/03/2015  . Anterior uveitis 06/22/2015  . Pain of right eye 05/21/2015  . Allergic conjunctivitis of both eyes 03/14/2014  . Anxiety, generalized 01/04/2014  . Pseudophakia of both  eyes 04/27/2013  . Obesity, Class III, BMI 40-49.9 (morbid obesity) ([place]) 04/13/2013  . Peripheral neuropathy 12/14/2012  . Diabetes ([place]) 11/10/2012  . Primary open angle glaucoma of both eyes, mild stage 11/10/2012  . Spinal stenosis of lumbar region with neurogenic claudication 09/08/2012  . Low back pain 07/19/2012  . Diabetes mellitus with stage 3 chronic kidney disease ([place]) 11/12/2010  . Essential hypertension 11/29/2009  . Benign neoplasm of skin 05/02/2009  . Hypothyroidism 05/02/2009  . Constipation 04/16/2009  . Dyslipidemia 06/16/2008  . Other affections of shoulder region, not elsewhere classified 05/30/2008  . Obstructive sleep apnea syndrome 04/07/2008  . Anemia, chronic disease 03/22/2008  . Alopecia 02/02/2008  . DDD (degenerative disc disease), lumbar 01/18/2008  . Osteoarthritis of multiple joints 01/18/2008    Standley Brooking 02/10/2019, 6:00 PM  [facility] [place], [place], [address] Phone: [phone]   Fax:  [phone]  Name: Debbie Arnold MRN: [id number] Date of Birth: 1926/12/13

## 2019-02-17 ENCOUNTER — Ambulatory Visit: Payer: Medicare Other | Admitting: Physical Therapy

## 2019-02-24 ENCOUNTER — Ambulatory Visit: Payer: Medicare Other | Admitting: Physical Therapy

## 2020-01-30 ENCOUNTER — Ambulatory Visit: Payer: Medicare Other | Admitting: Podiatry

## 2021-08-01 ENCOUNTER — Emergency Department (HOSPITAL_COMMUNITY): Payer: Medicare Other

## 2021-08-01 ENCOUNTER — Encounter (HOSPITAL_COMMUNITY): Payer: Self-pay

## 2021-08-01 ENCOUNTER — Emergency Department (HOSPITAL_COMMUNITY)
Admission: EM | Admit: 2021-08-01 | Discharge: 2021-08-01 | Disposition: A | Discharge status: Home / Self Care | Payer: Medicare Other | Attending: Emergency Medicine | Admitting: Emergency Medicine

## 2021-08-01 DIAGNOSIS — Z7984 Long term (current) use of oral hypoglycemic drugs: Secondary | ICD-10-CM | POA: Insufficient documentation

## 2021-08-01 DIAGNOSIS — N184 Chronic kidney disease, stage 4 (severe): Secondary | ICD-10-CM | POA: Diagnosis not present

## 2021-08-01 DIAGNOSIS — Z7982 Long term (current) use of aspirin: Secondary | ICD-10-CM | POA: Insufficient documentation

## 2021-08-01 DIAGNOSIS — E1122 Type 2 diabetes mellitus with diabetic chronic kidney disease: Secondary | ICD-10-CM | POA: Diagnosis not present

## 2021-08-01 DIAGNOSIS — I129 Hypertensive chronic kidney disease with stage 1 through stage 4 chronic kidney disease, or unspecified chronic kidney disease: Secondary | ICD-10-CM | POA: Diagnosis not present

## 2021-08-01 DIAGNOSIS — R319 Hematuria, unspecified: Secondary | ICD-10-CM

## 2021-08-01 DIAGNOSIS — E039 Hypothyroidism, unspecified: Secondary | ICD-10-CM | POA: Insufficient documentation

## 2021-08-01 LAB — CBC WITH DIFFERENTIAL/PLATELET
Abs Immature Granulocytes: 0.02 10*3/uL (ref 0.00–0.07)
Basophils Absolute: 0 10*3/uL (ref 0.0–0.1)
Basophils Relative: 0 %
Eosinophils Absolute: 0.1 10*3/uL (ref 0.0–0.5)
Eosinophils Relative: 3 %
HCT: 37 % (ref 36.0–46.0)
Hemoglobin: 11.6 g/dL — ABNORMAL LOW (ref 12.0–15.0)
Immature Granulocytes: 0 %
Lymphocytes Relative: 20 %
Lymphs Abs: 0.9 10*3/uL (ref 0.7–4.0)
MCH: 31.1 pg (ref 26.0–34.0)
MCHC: 31.4 g/dL (ref 30.0–36.0)
MCV: 99.2 fL (ref 80.0–100.0)
Monocytes Absolute: 0.8 10*3/uL (ref 0.1–1.0)
Monocytes Relative: 18 %
Neutro Abs: 2.6 10*3/uL (ref 1.7–7.7)
Neutrophils Relative %: 59 %
Platelets: 199 10*3/uL (ref 150–400)
RBC: 3.73 MIL/uL — ABNORMAL LOW (ref 3.87–5.11)
RDW: 13.7 % (ref 11.5–15.5)
WBC: 4.5 10*3/uL (ref 4.0–10.5)
nRBC: 0 % (ref 0.0–0.2)

## 2021-08-01 LAB — URINALYSIS, ROUTINE W REFLEX MICROSCOPIC
Bilirubin Urine: NEGATIVE
Glucose, UA: 50 mg/dL — AB
Ketones, ur: 5 mg/dL — AB
Nitrite: NEGATIVE
Protein, ur: 100 mg/dL — AB
RBC / HPF: >50 RBC/hpf — ABNORMAL HIGH (ref 0–5)
Specific Gravity, Urine: 1.012 (ref 1.005–1.030)
WBC, UA: >50 WBC/hpf — ABNORMAL HIGH (ref 0–5)
pH: 6 (ref 5.0–8.0)

## 2021-08-01 LAB — BASIC METABOLIC PANEL
Anion gap: 10 (ref 5–15)
BUN: 23 mg/dL (ref 8–23)
CO2: 27 mmol/L (ref 22–32)
Calcium: 9.1 mg/dL (ref 8.9–10.3)
Chloride: 102 mmol/L (ref 98–111)
Creatinine, Ser: 0.83 mg/dL (ref 0.44–1.00)
GFR, Estimated: >60 mL/min (ref >60)
Glucose, Bld: 89 mg/dL (ref 70–99)
Potassium: 4.1 mmol/L (ref 3.5–5.1)
Sodium: 139 mmol/L (ref 135–145)

## 2021-08-01 LAB — CBG MONITORING, ED: Glucose-Capillary: 139 mg/dL — ABNORMAL HIGH (ref 70–99)

## 2021-08-01 LAB — PROTIME-INR
INR: 1 (ref 0.8–1.2)
Prothrombin Time: 12.8 seconds (ref 11.4–15.2)

## 2021-08-01 MED ORDER — AMLODIPINE BESYLATE 5 MG PO TABS
5.0000 mg | ORAL_TABLET | Freq: Once | ORAL | Status: AC
  Administered 2021-08-01: 5 mg via ORAL
  Filled 2021-08-01: qty 1

## 2021-08-01 MED ORDER — ACETAMINOPHEN 325 MG PO TABS
650.0000 mg | ORAL_TABLET | Freq: Once | ORAL | Status: AC
  Administered 2021-08-01: 650 mg via ORAL
  Filled 2021-08-01: qty 2

## 2021-08-01 NOTE — ED Notes (Signed)
Attempted X2 to reach [employer] to give report on pt. Left voicemail with nursing staff with call back number.

## 2021-08-01 NOTE — ED Notes (Signed)
Provider at the bedside.  

## 2021-08-01 NOTE — ED Provider Notes (Signed)
[facility] DEPT Provider Note   CSN: [id number] Arrival date & time: 08/01/21  0800     History Chief Complaint  Patient presents with   Hematuria    Debbie Arnold is a 87 y.o. female.  HPI  87 year old female past medical history of DM, HTN, CKD, osteoarthritis with degenerative disc disease and spinal canal stenosis in the lumbar area presents to the emergency department with concern for hematuria.  Patient is from living facility.  They noticed that her diaper had bloodstained urine in it this morning.  Patient denies any previous history of hematuria or kidney stones. Denies current dysuria or difficulty urinating. Family member at bedside states that she has had UTIs in the past given that she wears a diaper.  Patient endorses lower back and sometimes flank discomfort.  No reported fever.  She denies any nausea/vomiting/diarrhea.  She is not noted to be on any anticoagulation.  Past Medical History:  Diagnosis Date   Diabetes mellitus without complication ([place])    Hypertension    Thyroid disease     Patient Active Problem List   Diagnosis Date Noted   Acute kidney injury ([place]) 11/24/2017   Hyponatremia 11/24/2017   Benign hypertension with CKD (chronic kidney disease) stage IV ([place]) 11/23/2017   CKD (chronic kidney disease) stage 4, GFR 15-29 ml/min ([place]) 11/23/2017   Diabetes mellitus type 2 in obese ([place]) 11/23/2017   Hyperlipidemia with target LDL less than 100 11/23/2017   Lumbar facet arthropathy 05/28/2017   Steroid responder, bilateral 01/12/2017   COPD (chronic obstructive pulmonary disease) ([place]) 08/05/2016   Gastroesophageal reflux disease without esophagitis 08/02/2015   Laryngopharyngeal reflux 08/02/2015   Corneal edema 07/03/2015   XT (exotropia) 07/03/2015   Anterior uveitis 06/22/2015   Pain of right eye 05/21/2015   Allergic conjunctivitis of both eyes 03/14/2014   Anxiety, generalized 01/04/2014   Pseudophakia of both  eyes 04/27/2013   Obesity, Class III, BMI 40-49.9 (morbid obesity) ([place]) 04/13/2013   Peripheral neuropathy 12/14/2012   Diabetes ([place]) 11/10/2012   Primary open angle glaucoma of both eyes, mild stage 11/10/2012   Spinal stenosis of lumbar region with neurogenic claudication 09/08/2012   Low back pain 07/19/2012   Diabetes mellitus with stage 3 chronic kidney disease ([place]) 11/12/2010   Essential hypertension 11/29/2009   Benign neoplasm of skin 05/02/2009   Hypothyroidism 05/02/2009   Constipation 04/16/2009   Dyslipidemia 06/16/2008   Other affections of shoulder region, not elsewhere classified 05/30/2008   Obstructive sleep apnea syndrome 04/07/2008   Anemia, chronic disease 03/22/2008   Alopecia 02/02/2008   DDD (degenerative disc disease), lumbar 01/18/2008   Osteoarthritis of multiple joints 01/18/2008    History reviewed. No pertinent surgical history.   OB History   No obstetric history on file.     History reviewed. No pertinent family history.  Social History   Tobacco Use   Smoking status: Never   Smokeless tobacco: Never  Substance Use Topics   Alcohol use: No   Drug use: No    Home Medications Prior to Admission medications   Medication Sig Start Date End Date Taking? Authorizing Provider  acetaminophen (TYLENOL) 325 MG tablet Take 650 mg by mouth every 6 (six) hours as needed for pain.    [provider]  albuterol (PROVENTIL HFA;VENTOLIN HFA) 108 (90 BASE) MCG/ACT inhaler Inhale 2 puffs into the lungs every 6 (six) hours as needed for wheezing.    [provider]  amoxicillin (AMOXIL) 500 MG  capsule TAKE ONE CAPSULE (500 MG DOSE) BY MOUTH 3 (THREE) TIMES A DAY. 03/24/18   [provider]  aspirin 81 MG tablet Take 81 mg by mouth daily.    [provider]  azelastine (OPTIVAR) 0.05 % ophthalmic solution Apply to eye. 03/16/18   [provider]  Brinzolamide-Brimonidine [facility] OP) Place 1 drop into both eyes 3  (three) times daily.     [provider]  Calcium & Magnesium Carbonates (MYLANTA PO) Take 30 mL by mouth daily as needed (constipation).    [provider]  Cholecalciferol (VITAMIN D-3) 5000 UNITS TABS Take 5,000 Units by mouth daily.     [provider]  clonazePAM (KLONOPIN) 0.5 MG tablet Take 0.5 mg by mouth 2 (two) times daily as needed for anxiety.    [provider]  cyclopentolate (CYCLODRYL,CYCLOGYL) 1 % ophthalmic solution  04/07/17   [provider]  dorzolamide (TRUSOPT) 2 % ophthalmic solution Place 1 drop into both eyes 2 (two) times daily. 11/05/17   [provider]  doxazosin (CARDURA) 8 MG tablet Take 8 mg by mouth daily.     [provider]  doxepin (SINEQUAN) 10 MG capsule Take 20 mg by mouth at bedtime. 05/14/18   [provider]  DULoxetine (CYMBALTA) 30 MG capsule Take 30 mg by mouth daily.    [provider]  ferrous sulfate 325 (65 FE) MG tablet Take 325 mg by mouth daily with breakfast.    [provider]  fexofenadine (ALLEGRA) 60 MG tablet Take by mouth. 03/16/18   [provider]  furosemide (LASIX) 20 MG tablet Take 20 mg by mouth daily.     [provider]  gabapentin (NEURONTIN) 100 MG capsule Take 300 mg by mouth 3 (three) times daily.     [provider]  glipiZIDE (GLUCOTROL XL) 10 MG 24 hr tablet Take 5 mg by mouth daily.     [provider]  ketorolac (ACULAR) 0.5 % ophthalmic solution 1 drop 4 (four) times daily.    [provider]  levothyroxine (SYNTHROID, LEVOTHROID) 112 MCG tablet Take 112 mcg by mouth daily before breakfast.    [provider]  multivitamin-iron-minerals-folic acid (CENTRUM) chewable tablet Chew 1 tablet by mouth daily.    [provider]  omeprazole (PRILOSEC) 20 MG capsule Take 20 mg by mouth 2 (two) times daily.     [provider]  polyethylene glycol powder (GLYCOLAX) powder Take  17 g by mouth 2 (two) times daily. 07/21/13   Bonk, John-Adam, MD  Simethicone (GAS-X PO) Take 2 tablets by mouth 2 (two) times daily as needed (gas).    [provider]  simvastatin (ZOCOR) 20 MG tablet Take 20 mg by mouth every evening.    [provider]  traMADol (ULTRAM) 50 MG tablet Take 50 mg by mouth every 6 (six) hours as needed for pain.    [provider]  verapamil (VERELAN PM) 360 MG 24 hr capsule Take 360 mg by mouth at bedtime.    [provider]    Allergies    Brimonidine, Celecoxib, Red dye, and Codeine  Review of Systems   Review of Systems  Constitutional:  Positive for fatigue. Negative for chills and fever.  HENT:  Negative for congestion.   Eyes:  Negative for visual disturbance.  Respiratory:  Negative for shortness of breath.   Cardiovascular:  Negative for chest pain.  Gastrointestinal:  Negative for abdominal pain, diarrhea, nausea and vomiting.  Genitourinary:  Positive for flank pain, hematuria and vaginal pain. Negative for difficulty urinating, dysuria and pelvic pain.  Musculoskeletal:  Positive for back pain.  Skin:  Negative for rash.  Neurological:  Negative for headaches.   Physical Exam Updated Vital Signs BP (!) 195/73   Pulse 69   Temp 98.5 F (36.9 C) (Oral)   Resp (!) 21   SpO2 97%   Physical Exam Vitals and nursing note reviewed.  Constitutional:      General: She is not in acute distress.    Appearance: Normal appearance.  HENT:     Head: Normocephalic.     Mouth/Throat:     Mouth: Mucous membranes are moist.  Eyes:     Comments: Disconjugate gaze which is baseline  Cardiovascular:     Rate and Rhythm: Normal rate.  Pulmonary:     Effort: Pulmonary effort is normal. No respiratory distress.  Abdominal:     General: There is no distension.     Palpations: Abdomen is soft.     Tenderness: There is no abdominal tenderness.  Genitourinary:    Comments: Bloody urine on straight cath, no noted  bloody stool Skin:    General: Skin is warm.  Neurological:     Mental Status: She is alert and oriented to person, place, and time. Mental status is at baseline.  Psychiatric:        Mood and Affect: Mood normal.    ED Results / Procedures / Treatments   Labs (all labs ordered are listed, but only abnormal results are displayed) Labs Reviewed  CBC WITH DIFFERENTIAL/PLATELET - Abnormal; Notable for the following components:      Result Value   RBC 3.73 (*)    Hemoglobin 11.6 (*)    All other components within normal limits  PROTIME-INR  BASIC METABOLIC PANEL  URINALYSIS, ROUTINE W REFLEX MICROSCOPIC    EKG None  Radiology No results found.  Procedures Procedures   Medications Ordered in ED Medications - No data to display  ED Course  I have reviewed the triage vital signs and the nursing notes.  Pertinent labs & imaging results that were available during my care of the patient were reviewed by me and considered in my medical decision making (see chart for details).    MDM Rules/Calculators/A&P                           87 year old female presents emergency department concern for hematuria.  She is hypertensive on arrival but otherwise stable vitals.  Not complaining of any discomfort.  Patient does wear diaper secondary to mobility issues.  Straight cath shows bloody urine, does not appear to be coming from the rectum.  Blood work shows stable hemoglobin, normal kidney function.  She is not anticoagulated.  CAT scan identifies hyperdensity in the bladder consistent with bleeding versus suspicious for mass.  Discussed with the patient and family at bedside the importance of urology follow-up for cystoscopy.  At this time no signs of active hemorrhage or unstable state warrant emergent urology consult/admission/cystoscopy at this time.  Blood pressure has improved.  Ambulatory urology consult has been placed.  Patient at this time appears safe and stable for discharge and  will be treated as an outpatient.  Discharge plan and strict return to ED precautions discussed, patient verbalizes understanding and agreement.  Final Clinical Impression(s) / ED Diagnoses Final diagnoses:  None    Rx / DC Orders ED Discharge  Orders     None        Lorelle Gibbs, DO 08/01/21 1322

## 2021-08-01 NOTE — ED Notes (Signed)
Pt to imaging at this time.

## 2021-08-01 NOTE — ED Notes (Signed)
Urine sample obtained via straight cath. Dark red blood with clots present. Provider notified and aware. Urine sample sent to lab at this time.

## 2021-08-01 NOTE — ED Triage Notes (Signed)
Pt presents to the ED or hematuria from her NSF at [facility]. Per EMS, staff member was changing the pt's brief and noticed blood "on her diaper." Per EMS, pt has no past hx of hematuria or UTI. Pt has a known health hx of COPD, DM, and HTN. Per EMS, pt has no accompanying sx including abd pain, blood in stool, N/V, fever or chills.

## 2021-08-01 NOTE — ED Notes (Signed)
Pt. Soiled brief with urine.pt. cleaned up with peri-cleanse on washcloth and dried. Pt. New brief applied.

## 2021-08-01 NOTE — Discharge Instructions (Addendum)
You have been seen and discharged from the emergency department.  Your work-up today shows no bladder infection, obstruction, kidney dysfunction or low hemoglobin levels.  CAT scan identifies bleeding within the bladder.  You need to have follow-up with urology for further evaluation, cystoscopy to evaluate the inside of the bladder for bleeding source/abnormalities. This may stop on its own. Stay well hydrated, drink lots of water.  An ambulatory referral to urology has been placed.  I have also attached information for [facility] if needed.  Follow-up with your primary provider for reevaluation and further care. Take home medications as prescribed. If you have any worsening symptoms, difficulty urinating, chest pain/shortness of breath/lightheadedness, painful urination, fevers or further concerns for your health please return to an emergency department for further evaluation.

## 2021-08-01 NOTE — ED Notes (Signed)
PTAR to back to [employer]

## 2021-08-08 ENCOUNTER — Other Ambulatory Visit (HOSPITAL_COMMUNITY): Payer: Self-pay

## 2021-08-08 MED ORDER — NITROFURANTOIN MONOHYD MACRO 100 MG PO CAPS
100.0000 mg | ORAL_CAPSULE | Freq: Two times a day (BID) | ORAL | 0 refills | Status: DC
  Filled 2021-08-08: qty 14, 7d supply, fill #0

## 2021-12-29 ENCOUNTER — Inpatient Hospital Stay (HOSPITAL_COMMUNITY)
Admission: EM | Admit: 2021-12-29 | Discharge: 2022-01-08 | DRG: 291 | Disposition: A | Discharge status: Skilled Nursing Facility | Payer: Medicare Other | Attending: Internal Medicine | Admitting: Internal Medicine

## 2021-12-29 ENCOUNTER — Encounter (HOSPITAL_COMMUNITY): Payer: Self-pay | Admitting: Emergency Medicine

## 2021-12-29 ENCOUNTER — Emergency Department (HOSPITAL_COMMUNITY): Payer: Medicare Other

## 2021-12-29 DIAGNOSIS — I5023 Acute on chronic systolic (congestive) heart failure: Secondary | ICD-10-CM | POA: Diagnosis present

## 2021-12-29 DIAGNOSIS — Z79890 Hormone replacement therapy (postmenopausal): Secondary | ICD-10-CM

## 2021-12-29 DIAGNOSIS — F32A Depression, unspecified: Secondary | ICD-10-CM

## 2021-12-29 DIAGNOSIS — Z888 Allergy status to other drugs, medicaments and biological substances status: Secondary | ICD-10-CM

## 2021-12-29 DIAGNOSIS — G4733 Obstructive sleep apnea (adult) (pediatric): Secondary | ICD-10-CM | POA: Diagnosis present

## 2021-12-29 DIAGNOSIS — E669 Obesity, unspecified: Secondary | ICD-10-CM | POA: Diagnosis present

## 2021-12-29 DIAGNOSIS — Z885 Allergy status to narcotic agent status: Secondary | ICD-10-CM

## 2021-12-29 DIAGNOSIS — N179 Acute kidney failure, unspecified: Secondary | ICD-10-CM | POA: Diagnosis present

## 2021-12-29 DIAGNOSIS — Z7982 Long term (current) use of aspirin: Secondary | ICD-10-CM

## 2021-12-29 DIAGNOSIS — E039 Hypothyroidism, unspecified: Secondary | ICD-10-CM | POA: Diagnosis present

## 2021-12-29 DIAGNOSIS — N390 Urinary tract infection, site not specified: Secondary | ICD-10-CM

## 2021-12-29 DIAGNOSIS — I10 Essential (primary) hypertension: Secondary | ICD-10-CM | POA: Diagnosis present

## 2021-12-29 DIAGNOSIS — E1169 Type 2 diabetes mellitus with other specified complication: Secondary | ICD-10-CM | POA: Diagnosis present

## 2021-12-29 DIAGNOSIS — N183 Chronic kidney disease, stage 3 unspecified: Secondary | ICD-10-CM | POA: Diagnosis present

## 2021-12-29 DIAGNOSIS — K219 Gastro-esophageal reflux disease without esophagitis: Secondary | ICD-10-CM | POA: Diagnosis present

## 2021-12-29 DIAGNOSIS — Z881 Allergy status to other antibiotic agents status: Secondary | ICD-10-CM

## 2021-12-29 DIAGNOSIS — I509 Heart failure, unspecified: Secondary | ICD-10-CM | POA: Diagnosis not present

## 2021-12-29 DIAGNOSIS — I5033 Acute on chronic diastolic (congestive) heart failure: Secondary | ICD-10-CM | POA: Diagnosis present

## 2021-12-29 DIAGNOSIS — E66813 Obesity, class 3: Secondary | ICD-10-CM

## 2021-12-29 DIAGNOSIS — M25512 Pain in left shoulder: Secondary | ICD-10-CM | POA: Diagnosis present

## 2021-12-29 DIAGNOSIS — N189 Chronic kidney disease, unspecified: Secondary | ICD-10-CM | POA: Diagnosis present

## 2021-12-29 DIAGNOSIS — Z79891 Long term (current) use of opiate analgesic: Secondary | ICD-10-CM

## 2021-12-29 DIAGNOSIS — N1831 Chronic kidney disease, stage 3a: Secondary | ICD-10-CM | POA: Diagnosis present

## 2021-12-29 DIAGNOSIS — J449 Chronic obstructive pulmonary disease, unspecified: Secondary | ICD-10-CM | POA: Diagnosis present

## 2021-12-29 DIAGNOSIS — Z20822 Contact with and (suspected) exposure to covid-19: Secondary | ICD-10-CM | POA: Diagnosis present

## 2021-12-29 DIAGNOSIS — I130 Hypertensive heart and chronic kidney disease with heart failure and stage 1 through stage 4 chronic kidney disease, or unspecified chronic kidney disease: Secondary | ICD-10-CM | POA: Diagnosis not present

## 2021-12-29 DIAGNOSIS — J810 Acute pulmonary edema: Secondary | ICD-10-CM

## 2021-12-29 DIAGNOSIS — M159 Polyosteoarthritis, unspecified: Secondary | ICD-10-CM | POA: Diagnosis present

## 2021-12-29 DIAGNOSIS — Z9102 Food additives allergy status: Secondary | ICD-10-CM

## 2021-12-29 DIAGNOSIS — E1122 Type 2 diabetes mellitus with diabetic chronic kidney disease: Secondary | ICD-10-CM | POA: Diagnosis present

## 2021-12-29 DIAGNOSIS — Z79899 Other long term (current) drug therapy: Secondary | ICD-10-CM

## 2021-12-29 DIAGNOSIS — Z6841 Body Mass Index (BMI) 40.0 and over, adult: Secondary | ICD-10-CM

## 2021-12-29 DIAGNOSIS — D631 Anemia in chronic kidney disease: Secondary | ICD-10-CM | POA: Diagnosis present

## 2021-12-29 DIAGNOSIS — E785 Hyperlipidemia, unspecified: Secondary | ICD-10-CM | POA: Diagnosis present

## 2021-12-29 DIAGNOSIS — Z7984 Long term (current) use of oral hypoglycemic drugs: Secondary | ICD-10-CM

## 2021-12-29 HISTORY — DX: Unspecified systolic (congestive) heart failure: I50.20

## 2021-12-29 LAB — CBC WITH DIFFERENTIAL/PLATELET
Abs Immature Granulocytes: 0.01 10*3/uL (ref 0.00–0.07)
Basophils Absolute: 0 10*3/uL (ref 0.0–0.1)
Basophils Relative: 0 %
Eosinophils Absolute: 0.3 10*3/uL (ref 0.0–0.5)
Eosinophils Relative: 5 %
HCT: 36.2 % (ref 36.0–46.0)
Hemoglobin: 11.8 g/dL — ABNORMAL LOW (ref 12.0–15.0)
Immature Granulocytes: 0 %
Lymphocytes Relative: 35 %
Lymphs Abs: 1.8 10*3/uL (ref 0.7–4.0)
MCH: 32.4 pg (ref 26.0–34.0)
MCHC: 32.6 g/dL (ref 30.0–36.0)
MCV: 99.5 fL (ref 80.0–100.0)
Monocytes Absolute: 0.5 10*3/uL (ref 0.1–1.0)
Monocytes Relative: 10 %
Neutro Abs: 2.5 10*3/uL (ref 1.7–7.7)
Neutrophils Relative %: 50 %
Platelets: 201 10*3/uL (ref 150–400)
RBC: 3.64 MIL/uL — ABNORMAL LOW (ref 3.87–5.11)
RDW: 13.2 % (ref 11.5–15.5)
WBC: 5.1 10*3/uL (ref 4.0–10.5)
nRBC: 0 % (ref 0.0–0.2)

## 2021-12-29 LAB — COMPREHENSIVE METABOLIC PANEL
ALT: 12 U/L (ref 0–44)
AST: 13 U/L — ABNORMAL LOW (ref 15–41)
Albumin: 3.7 g/dL (ref 3.5–5.0)
Alkaline Phosphatase: 43 U/L (ref 38–126)
Anion gap: 10 (ref 5–15)
BUN: 34 mg/dL — ABNORMAL HIGH (ref 8–23)
CO2: 27 mmol/L (ref 22–32)
Calcium: 9.3 mg/dL (ref 8.9–10.3)
Chloride: 101 mmol/L (ref 98–111)
Creatinine, Ser: 1.09 mg/dL — ABNORMAL HIGH (ref 0.44–1.00)
GFR, Estimated: 48 mL/min — ABNORMAL LOW (ref >60)
Glucose, Bld: 235 mg/dL — ABNORMAL HIGH (ref 70–99)
Potassium: 4.7 mmol/L (ref 3.5–5.1)
Sodium: 138 mmol/L (ref 135–145)
Total Bilirubin: 0.3 mg/dL (ref 0.3–1.2)
Total Protein: 7.1 g/dL (ref 6.5–8.1)

## 2021-12-29 LAB — TROPONIN I (HIGH SENSITIVITY)
Troponin I (High Sensitivity): 8 ng/L (ref <18)
Troponin I (High Sensitivity): 7 ng/L (ref <18)

## 2021-12-29 LAB — BRAIN NATRIURETIC PEPTIDE: B Natriuretic Peptide: 50.5 pg/mL (ref 0.0–100.0)

## 2021-12-29 MED ORDER — FUROSEMIDE 10 MG/ML IJ SOLN
40.0000 mg | Freq: Once | INTRAMUSCULAR | Status: AC
  Administered 2021-12-29: 40 mg via INTRAVENOUS
  Filled 2021-12-29: qty 4

## 2021-12-29 NOTE — ED Provider Notes (Signed)
[facility] EMERGENCY DEPARTMENT Provider Note  CSN: [id number] Arrival date & time: 12/29/21 2029  Chief Complaint(s) No chief complaint on file.  HPI Debbie Arnold is a 89 y.o. female with PMH T2DM, HTN, CHF, COPD who presents the emergency department for evaluation of swelling.  Patient arrives from a facility who apparently does not take daily weights on the patient they are unsure of patient's overall weight gain.  Patient is endorsing worsening lower extremity and upper extremity pain due to worsening swelling.  Patient endorsing mild intermittent shortness of breath and cough.  Denies chest pain, abdominal pain, nausea, vomiting or other systemic symptoms.  EMS concern for UTI due to foul smell upon picking the patient up from her facility.  HPI  Past Medical History Past Medical History:  Diagnosis Date   Diabetes mellitus without complication ([place])    Hypertension    Thyroid disease    Patient Active Problem List   Diagnosis Date Noted   Acute kidney injury ([place]) 11/24/2017   Hyponatremia 11/24/2017   Benign hypertension with CKD (chronic kidney disease) stage IV ([place]) 11/23/2017   CKD (chronic kidney disease) stage 4, GFR 15-29 ml/min ([place]) 11/23/2017   Diabetes mellitus type 2 in obese ([place]) 11/23/2017   Hyperlipidemia with target LDL less than 100 11/23/2017   Lumbar facet arthropathy 05/28/2017   Steroid responder, bilateral 01/12/2017   COPD (chronic obstructive pulmonary disease) ([place]) 08/05/2016   Gastroesophageal reflux disease without esophagitis 08/02/2015   Laryngopharyngeal reflux 08/02/2015   Corneal edema 07/03/2015   XT (exotropia) 07/03/2015   Anterior uveitis 06/22/2015   Pain of right eye 05/21/2015   Allergic conjunctivitis of both eyes 03/14/2014   Anxiety, generalized 01/04/2014   Pseudophakia of both eyes 04/27/2013   Obesity, Class III, BMI 40-49.9 (morbid obesity) ([place]) 04/13/2013   Peripheral neuropathy 12/14/2012    Diabetes ([place]) 11/10/2012   Primary open angle glaucoma of both eyes, mild stage 11/10/2012   Spinal stenosis of lumbar region with neurogenic claudication 09/08/2012   Low back pain 07/19/2012   Diabetes mellitus with stage 3 chronic kidney disease ([place]) 11/12/2010   Essential hypertension 11/29/2009   Benign neoplasm of skin 05/02/2009   Hypothyroidism 05/02/2009   Constipation 04/16/2009   Dyslipidemia 06/16/2008   Other affections of shoulder region, not elsewhere classified 05/30/2008   Obstructive sleep apnea syndrome 04/07/2008   Anemia, chronic disease 03/22/2008   Alopecia 02/02/2008   DDD (degenerative disc disease), lumbar 01/18/2008   Osteoarthritis of multiple joints 01/18/2008   Home Medication(s) Prior to Admission medications   Medication Sig Start Date End Date Taking? Authorizing Provider  acetaminophen (TYLENOL) 325 MG tablet Take 650 mg by mouth every 6 (six) hours as needed for pain.    [provider]  albuterol (PROVENTIL HFA;VENTOLIN HFA) 108 (90 BASE) MCG/ACT inhaler Inhale 2 puffs into the lungs every 6 (six) hours as needed for wheezing.    [provider]  amoxicillin (AMOXIL) 500 MG capsule TAKE ONE CAPSULE (500 MG DOSE) BY MOUTH 3 (THREE) TIMES A DAY. 03/24/18   [provider]  aspirin 81 MG tablet Take 81 mg by mouth daily.    [provider]  azelastine (OPTIVAR) 0.05 % ophthalmic solution Apply to eye. 03/16/18   [provider]  Brinzolamide-Brimonidine [facility] OP) Place 1 drop into both eyes 3 (three) times daily.     [provider]  Calcium & Magnesium Carbonates (MYLANTA PO) Take 30 mL by mouth daily as needed (constipation).  [provider]  Cholecalciferol (VITAMIN D-3) 5000 UNITS TABS Take 5,000 Units by mouth daily.     [provider]  clonazePAM (KLONOPIN) 0.5 MG tablet Take 0.5 mg by mouth 2 (two) times daily as needed for anxiety.    [provider]   cyclopentolate (CYCLODRYL,CYCLOGYL) 1 % ophthalmic solution  04/07/17   [provider]  dorzolamide (TRUSOPT) 2 % ophthalmic solution Place 1 drop into both eyes 2 (two) times daily. 11/05/17   [provider]  doxazosin (CARDURA) 8 MG tablet Take 8 mg by mouth daily.     [provider]  doxepin (SINEQUAN) 10 MG capsule Take 20 mg by mouth at bedtime. 05/14/18   [provider]  DULoxetine (CYMBALTA) 30 MG capsule Take 30 mg by mouth daily.    [provider]  ferrous sulfate 325 (65 FE) MG tablet Take 325 mg by mouth daily with breakfast.    [provider]  fexofenadine (ALLEGRA) 60 MG tablet Take by mouth. 03/16/18   [provider]  furosemide (LASIX) 20 MG tablet Take 20 mg by mouth daily.     [provider]  gabapentin (NEURONTIN) 100 MG capsule Take 300 mg by mouth 3 (three) times daily.     [provider]  glipiZIDE (GLUCOTROL XL) 10 MG 24 hr tablet Take 5 mg by mouth daily.     [provider]  ketorolac (ACULAR) 0.5 % ophthalmic solution 1 drop 4 (four) times daily.    [provider]  levothyroxine (SYNTHROID, LEVOTHROID) 112 MCG tablet Take 112 mcg by mouth daily before breakfast.    [provider]  multivitamin-iron-minerals-folic acid (CENTRUM) chewable tablet Chew 1 tablet by mouth daily.    [provider]  nitrofurantoin, macrocrystal-monohydrate, (MACROBID) 100 MG capsule Take 1 capsule (100 mg total) by mouth 2 (two) times daily. 08/08/21     omeprazole (PRILOSEC) 20 MG capsule Take 20 mg by mouth 2 (two) times daily.     [provider]  polyethylene glycol powder (GLYCOLAX) powder Take 17 g by mouth 2 (two) times daily. 07/21/13   Bonk, John-Adam, MD  Simethicone (GAS-X PO) Take 2 tablets by mouth 2 (two) times daily as needed (gas).    [provider]  simvastatin (ZOCOR) 20 MG tablet Take 20 mg by mouth every evening.    [provider]   traMADol (ULTRAM) 50 MG tablet Take 50 mg by mouth every 6 (six) hours as needed for pain.    [provider]  verapamil (VERELAN PM) 360 MG 24 hr capsule Take 360 mg by mouth at bedtime.    [provider]                                                                                                                                    Past Surgical History History reviewed. No pertinent surgical history. Family History No family history  on file.  Social History Social History   Tobacco Use   Smoking status: Never   Smokeless tobacco: Never  Substance Use Topics   Alcohol use: No   Drug use: No   Allergies Brimonidine, Celecoxib, Red dye, and Codeine  Review of Systems Review of Systems  Respiratory:  Positive for cough and shortness of breath.   Cardiovascular:  Positive for leg swelling.   Physical Exam Vital Signs  I have reviewed the triage vital signs BP (!) 164/69    Pulse 74    Temp 98 F (36.7 C) (Oral)    Resp 17    SpO2 97%   Physical Exam Vitals and nursing note reviewed.  Constitutional:      General: She is not in acute distress.    Appearance: She is well-developed.  HENT:     Head: Normocephalic and atraumatic.  Eyes:     Conjunctiva/sclera: Conjunctivae normal.  Cardiovascular:     Rate and Rhythm: Normal rate and regular rhythm.     Heart sounds: No murmur heard. Pulmonary:     Effort: Pulmonary effort is normal. No respiratory distress.     Breath sounds: Normal breath sounds.  Abdominal:     Palpations: Abdomen is soft.     Tenderness: There is no abdominal tenderness.  Musculoskeletal:        General: No swelling.     Cervical back: Neck supple.     Right lower leg: Edema present.     Left lower leg: Edema present.  Skin:    General: Skin is warm and dry.     Capillary Refill: Capillary refill takes less than 2 seconds.  Neurological:     Mental Status: She is alert.  Psychiatric:        Mood and Affect: Mood normal.     ED Results and Treatments Labs (all labs ordered are listed, but only abnormal results are displayed) Labs Reviewed  COMPREHENSIVE METABOLIC PANEL - Abnormal; Notable for the following components:      Result Value   Glucose, Bld 235 (*)    BUN 34 (*)    Creatinine, Ser 1.09 (*)    AST 13 (*)    GFR, Estimated 48 (*)    All other components within normal limits  CBC WITH DIFFERENTIAL/PLATELET - Abnormal; Notable for the following components:   RBC 3.64 (*)    Hemoglobin 11.8 (*)    All other components within normal limits  BRAIN NATRIURETIC PEPTIDE  URINALYSIS, ROUTINE W REFLEX MICROSCOPIC  TROPONIN I (HIGH SENSITIVITY)  TROPONIN I (HIGH SENSITIVITY)                                                                                                                          Radiology DG Chest Portable 1 View  Result Date: 12/29/2021 CLINICAL DATA:  Dyspnea EXAM: PORTABLE CHEST 1 VIEW COMPARISON:  09/20/2017 FINDINGS: Cardiomegaly with central congestion. Linear scarring or atelectasis in the right mid lung.  Aortic atherosclerosis. No pleural effusion or pneumothorax IMPRESSION: Cardiomegaly with mild central congestion Electronically Signed   By: Donavan Foil M.D.   On: 12/29/2021 21:13    Pertinent labs & imaging results that were available during my care of the patient were reviewed by me and considered in my medical decision making (see MDM for details).  Medications Ordered in ED Medications - No data to display                                                                                                                                   Procedures Procedures  (including critical care time)  Medical Decision Making / ED Course   This patient presents to the ED for concern of fluid retention, this involves an extensive number of treatment options, and is a complaint that carries with it a high risk of complications and morbidity.  The differential diagnosis includes  CHF exacerbation, liver failure, renal failure  MDM: Patient seen emergency department for evaluation of fluid overload.  Physical exam reveals bilateral lower extremity pitting edema but is otherwise unremarkable.  Laboratory evaluation unremarkable including a normal BNP but this can be falsely decreased in the setting of obesity which this patient suffers from.  High-sensitivity troponin unremarkable.  Chest x-ray with pulmonary edema.  Lasix given.  Patient will require admission for diuresis.  Patient then admitted.   Additional history obtained: -Additional history obtained from son -External records from outside source obtained and reviewed including: Chart review including previous notes, labs, imaging, consultation notes   Lab Tests: -I ordered, reviewed, and interpreted labs.   The pertinent results include:   Labs Reviewed  COMPREHENSIVE METABOLIC PANEL - Abnormal; Notable for the following components:      Result Value   Glucose, Bld 235 (*)    BUN 34 (*)    Creatinine, Ser 1.09 (*)    AST 13 (*)    GFR, Estimated 48 (*)    All other components within normal limits  CBC WITH DIFFERENTIAL/PLATELET - Abnormal; Notable for the following components:   RBC 3.64 (*)    Hemoglobin 11.8 (*)    All other components within normal limits  BRAIN NATRIURETIC PEPTIDE  URINALYSIS, ROUTINE W REFLEX MICROSCOPIC  TROPONIN I (HIGH SENSITIVITY)  TROPONIN I (HIGH SENSITIVITY)      EKG   EKG Interpretation  Date/Time:  Sunday December 29 2021 20:43:44 EST Ventricular Rate:  82 PR Interval:  172 QRS Duration: 159 QT Interval:  419 QTC Calculation: 490 R Axis:   -79 Text Interpretation: Sinus rhythm RBBB and LAFB ST elevation in aVR seen in previous ECGs Confirmed by Vernon ([id number]) on 12/30/2021 12:29:14 AM         Imaging Studies ordered: I ordered imaging studies including CXR I independently visualized and interpreted imaging. I agree with the radiologist  interpretation   Medicines ordered and prescription drug management: No orders of the  defined types were placed in this encounter.   -I have reviewed the patients home medicines and have made adjustments as needed  Critical interventions none     Cardiac Monitoring: The patient was maintained on a cardiac monitor.  I personally viewed and interpreted the cardiac monitored which showed an underlying rhythm of: NSR  Social Determinants of Health:  Factors impacting patients care include: none   Reevaluation: After the interventions noted above, I reevaluated the patient and found that they have :improved  Co morbidities that complicate the patient evaluation  Past Medical History:  Diagnosis Date   Diabetes mellitus without complication ([place])    Hypertension    Thyroid disease       Dispostion: I considered admission for this patient, and the patient will be admitted for diuresis     Final Clinical Impression(s) / ED Diagnoses Final diagnoses:  None     @PCDICTATION @    Demica Zook, Debe Coder, MD 12/30/21 [id number]

## 2021-12-29 NOTE — ED Triage Notes (Signed)
Pt bib [facility] from [facility] for R arm pain and swelling. No dx of CHF on facility paperwork at bedside. Hx HTN, takes lasix for this. Possible UTI.   EMS vitals: 148/80 HR 88 98% room air RR 18 CBG 279

## 2021-12-30 ENCOUNTER — Inpatient Hospital Stay (HOSPITAL_COMMUNITY): Payer: Medicare Other

## 2021-12-30 ENCOUNTER — Other Ambulatory Visit: Payer: Self-pay

## 2021-12-30 ENCOUNTER — Encounter (HOSPITAL_COMMUNITY): Payer: Self-pay | Admitting: Internal Medicine

## 2021-12-30 DIAGNOSIS — Z7984 Long term (current) use of oral hypoglycemic drugs: Secondary | ICD-10-CM | POA: Diagnosis not present

## 2021-12-30 DIAGNOSIS — J449 Chronic obstructive pulmonary disease, unspecified: Secondary | ICD-10-CM | POA: Diagnosis present

## 2021-12-30 DIAGNOSIS — N1831 Chronic kidney disease, stage 3a: Secondary | ICD-10-CM

## 2021-12-30 DIAGNOSIS — N390 Urinary tract infection, site not specified: Secondary | ICD-10-CM | POA: Diagnosis present

## 2021-12-30 DIAGNOSIS — Z885 Allergy status to narcotic agent status: Secondary | ICD-10-CM | POA: Diagnosis not present

## 2021-12-30 DIAGNOSIS — I5023 Acute on chronic systolic (congestive) heart failure: Secondary | ICD-10-CM

## 2021-12-30 DIAGNOSIS — E1169 Type 2 diabetes mellitus with other specified complication: Secondary | ICD-10-CM | POA: Diagnosis not present

## 2021-12-30 DIAGNOSIS — G4733 Obstructive sleep apnea (adult) (pediatric): Secondary | ICD-10-CM | POA: Diagnosis present

## 2021-12-30 DIAGNOSIS — M25512 Pain in left shoulder: Secondary | ICD-10-CM | POA: Diagnosis present

## 2021-12-30 DIAGNOSIS — E669 Obesity, unspecified: Secondary | ICD-10-CM

## 2021-12-30 DIAGNOSIS — N179 Acute kidney failure, unspecified: Secondary | ICD-10-CM | POA: Diagnosis present

## 2021-12-30 DIAGNOSIS — Z6841 Body Mass Index (BMI) 40.0 and over, adult: Secondary | ICD-10-CM | POA: Diagnosis not present

## 2021-12-30 DIAGNOSIS — E039 Hypothyroidism, unspecified: Secondary | ICD-10-CM | POA: Diagnosis present

## 2021-12-30 DIAGNOSIS — N183 Chronic kidney disease, stage 3 unspecified: Secondary | ICD-10-CM

## 2021-12-30 DIAGNOSIS — J810 Acute pulmonary edema: Secondary | ICD-10-CM | POA: Diagnosis not present

## 2021-12-30 DIAGNOSIS — I509 Heart failure, unspecified: Secondary | ICD-10-CM | POA: Diagnosis present

## 2021-12-30 DIAGNOSIS — I5033 Acute on chronic diastolic (congestive) heart failure: Secondary | ICD-10-CM | POA: Diagnosis present

## 2021-12-30 DIAGNOSIS — M159 Polyosteoarthritis, unspecified: Secondary | ICD-10-CM | POA: Diagnosis present

## 2021-12-30 DIAGNOSIS — F32A Depression, unspecified: Secondary | ICD-10-CM | POA: Diagnosis present

## 2021-12-30 DIAGNOSIS — E785 Hyperlipidemia, unspecified: Secondary | ICD-10-CM | POA: Diagnosis present

## 2021-12-30 DIAGNOSIS — I5021 Acute systolic (congestive) heart failure: Secondary | ICD-10-CM | POA: Diagnosis not present

## 2021-12-30 DIAGNOSIS — Z79891 Long term (current) use of opiate analgesic: Secondary | ICD-10-CM | POA: Diagnosis not present

## 2021-12-30 DIAGNOSIS — I10 Essential (primary) hypertension: Secondary | ICD-10-CM | POA: Diagnosis not present

## 2021-12-30 DIAGNOSIS — D631 Anemia in chronic kidney disease: Secondary | ICD-10-CM | POA: Diagnosis present

## 2021-12-30 DIAGNOSIS — K219 Gastro-esophageal reflux disease without esophagitis: Secondary | ICD-10-CM | POA: Diagnosis present

## 2021-12-30 DIAGNOSIS — Z79890 Hormone replacement therapy (postmenopausal): Secondary | ICD-10-CM | POA: Diagnosis not present

## 2021-12-30 DIAGNOSIS — Z79899 Other long term (current) drug therapy: Secondary | ICD-10-CM | POA: Diagnosis not present

## 2021-12-30 DIAGNOSIS — Z7982 Long term (current) use of aspirin: Secondary | ICD-10-CM | POA: Diagnosis not present

## 2021-12-30 DIAGNOSIS — Z20822 Contact with and (suspected) exposure to covid-19: Secondary | ICD-10-CM | POA: Diagnosis present

## 2021-12-30 DIAGNOSIS — I130 Hypertensive heart and chronic kidney disease with heart failure and stage 1 through stage 4 chronic kidney disease, or unspecified chronic kidney disease: Secondary | ICD-10-CM | POA: Diagnosis present

## 2021-12-30 DIAGNOSIS — E1122 Type 2 diabetes mellitus with diabetic chronic kidney disease: Secondary | ICD-10-CM | POA: Diagnosis present

## 2021-12-30 LAB — CBG MONITORING, ED
Glucose-Capillary: 183 mg/dL — ABNORMAL HIGH (ref 70–99)
Glucose-Capillary: 146 mg/dL — ABNORMAL HIGH (ref 70–99)
Glucose-Capillary: 140 mg/dL — ABNORMAL HIGH (ref 70–99)
Glucose-Capillary: 114 mg/dL — ABNORMAL HIGH (ref 70–99)
Glucose-Capillary: 147 mg/dL — ABNORMAL HIGH (ref 70–99)

## 2021-12-30 LAB — ECHOCARDIOGRAM COMPLETE
AR max vel: 1.28 cm2
AV Area VTI: 1.39 cm2
AV Area mean vel: 1.21 cm2
AV Mean grad: 4 mmHg
AV Peak grad: 8.1 mmHg
Ao pk vel: 1.42 m/s
Area-P 1/2: 2.5 cm2
Calc EF: 51 %
Height: 65 in
S' Lateral: 1.6 cm
Single Plane A2C EF: 56 %
Single Plane A4C EF: 46.5 %
Weight: 3920 oz

## 2021-12-30 LAB — URINALYSIS, ROUTINE W REFLEX MICROSCOPIC
Bilirubin Urine: NEGATIVE
Glucose, UA: NEGATIVE mg/dL
Ketones, ur: NEGATIVE mg/dL
Nitrite: POSITIVE — AB
Protein, ur: NEGATIVE mg/dL
Specific Gravity, Urine: 1.01 (ref 1.005–1.030)
pH: 7 (ref 5.0–8.0)

## 2021-12-30 LAB — RESP PANEL BY RT-PCR (FLU A&B, COVID) ARPGX2
Influenza A by PCR: NEGATIVE
Influenza B by PCR: NEGATIVE
SARS Coronavirus 2 by RT PCR: NEGATIVE

## 2021-12-30 LAB — TSH: TSH: 11.935 u[IU]/mL — ABNORMAL HIGH (ref 0.350–4.500)

## 2021-12-30 MED ORDER — DOXAZOSIN MESYLATE 1 MG PO TABS
1.0000 mg | ORAL_TABLET | Freq: Every day | ORAL | Status: DC
  Administered 2021-12-30 – 2022-01-08 (×10): 1 mg via ORAL
  Filled 2021-12-30 (×10): qty 1

## 2021-12-30 MED ORDER — FUROSEMIDE 10 MG/ML IJ SOLN
40.0000 mg | Freq: Every day | INTRAMUSCULAR | Status: DC
  Administered 2021-12-30 – 2022-01-01 (×3): 40 mg via INTRAVENOUS
  Filled 2021-12-30 (×3): qty 4

## 2021-12-30 MED ORDER — SENNA 8.6 MG PO TABS
1.0000 | ORAL_TABLET | Freq: Every day | ORAL | Status: DC
  Administered 2021-12-30 – 2022-01-07 (×9): 8.6 mg via ORAL
  Filled 2021-12-30 (×9): qty 1

## 2021-12-30 MED ORDER — DICLOFENAC SODIUM 1 % EX GEL
2.0000 g | Freq: Two times a day (BID) | CUTANEOUS | Status: DC
  Administered 2021-12-30 – 2021-12-31 (×3): 2 g via TOPICAL
  Administered 2022-01-01 (×2): 4 g via TOPICAL
  Administered 2022-01-02: 2 g via TOPICAL
  Administered 2022-01-02 – 2022-01-03 (×2): 4 g via TOPICAL
  Administered 2022-01-04: 2 g via TOPICAL
  Administered 2022-01-04: 4 g via TOPICAL
  Administered 2022-01-05: 2 g via TOPICAL
  Administered 2022-01-05: 4 g via TOPICAL
  Administered 2022-01-06: 2 g via TOPICAL
  Administered 2022-01-06: 4 g via TOPICAL
  Administered 2022-01-07 – 2022-01-08 (×3): 2 g via TOPICAL
  Filled 2021-12-30 (×4): qty 100

## 2021-12-30 MED ORDER — UMECLIDINIUM-VILANTEROL 62.5-25 MCG/ACT IN AEPB
1.0000 | INHALATION_SPRAY | Freq: Every day | RESPIRATORY_TRACT | Status: DC
  Administered 2021-12-30 – 2022-01-08 (×9): 1 via RESPIRATORY_TRACT
  Filled 2021-12-30: qty 14

## 2021-12-30 MED ORDER — GABAPENTIN 300 MG PO CAPS
300.0000 mg | ORAL_CAPSULE | Freq: Two times a day (BID) | ORAL | Status: DC
  Administered 2021-12-30 – 2022-01-08 (×19): 300 mg via ORAL
  Filled 2021-12-30 (×19): qty 1

## 2021-12-30 MED ORDER — INSULIN ASPART 100 UNIT/ML IJ SOLN
0.0000 [IU] | Freq: Every day | INTRAMUSCULAR | Status: DC
  Administered 2022-01-07: 2 [IU] via SUBCUTANEOUS

## 2021-12-30 MED ORDER — DOCUSATE SODIUM 100 MG PO CAPS
100.0000 mg | ORAL_CAPSULE | Freq: Two times a day (BID) | ORAL | Status: DC
  Administered 2021-12-30 – 2022-01-08 (×19): 100 mg via ORAL
  Filled 2021-12-30 (×19): qty 1

## 2021-12-30 MED ORDER — BUDESONIDE 0.5 MG/2ML IN SUSP
0.5000 mg | Freq: Two times a day (BID) | RESPIRATORY_TRACT | Status: DC
  Administered 2021-12-30 – 2022-01-08 (×19): 0.5 mg via RESPIRATORY_TRACT
  Filled 2021-12-30 (×19): qty 2

## 2021-12-30 MED ORDER — PANTOPRAZOLE SODIUM 40 MG PO TBEC
40.0000 mg | DELAYED_RELEASE_TABLET | Freq: Every day | ORAL | Status: DC
  Administered 2021-12-30 – 2022-01-08 (×10): 40 mg via ORAL
  Filled 2021-12-30 (×10): qty 1

## 2021-12-30 MED ORDER — LOSARTAN POTASSIUM 50 MG PO TABS
100.0000 mg | ORAL_TABLET | Freq: Every day | ORAL | Status: DC
  Administered 2021-12-30 – 2022-01-08 (×10): 100 mg via ORAL
  Filled 2021-12-30 (×10): qty 2

## 2021-12-30 MED ORDER — POLYETHYLENE GLYCOL 3350 17 GM/SCOOP PO POWD: 17.0000 g | Freq: Two times a day (BID) | ORAL | Status: DC

## 2021-12-30 MED ORDER — ENOXAPARIN SODIUM 40 MG/0.4ML IJ SOSY
40.0000 mg | PREFILLED_SYRINGE | INTRAMUSCULAR | Status: DC
  Administered 2021-12-30 – 2022-01-08 (×10): 40 mg via SUBCUTANEOUS
  Filled 2021-12-30 (×10): qty 0.4

## 2021-12-30 MED ORDER — SODIUM CHLORIDE 0.9 % IV SOLN
1.0000 g | INTRAVENOUS | Status: DC
  Administered 2021-12-30 – 2022-01-01 (×3): 1 g via INTRAVENOUS
  Filled 2021-12-30 (×3): qty 10

## 2021-12-30 MED ORDER — SODIUM CHLORIDE 0.9% FLUSH: 3.0000 mL | INTRAVENOUS | Status: DC | PRN

## 2021-12-30 MED ORDER — LEVOTHYROXINE SODIUM 25 MCG PO TABS
125.0000 ug | ORAL_TABLET | Freq: Every day | ORAL | Status: DC
  Administered 2021-12-30 – 2022-01-08 (×10): 125 ug via ORAL
  Filled 2021-12-30 (×10): qty 1

## 2021-12-30 MED ORDER — SODIUM CHLORIDE 0.9 % IV SOLN: 250.0000 mL | INTRAVENOUS | Status: DC | PRN

## 2021-12-30 MED ORDER — ALBUTEROL SULFATE (2.5 MG/3ML) 0.083% IN NEBU: 2.5000 mg | INHALATION_SOLUTION | RESPIRATORY_TRACT | Status: DC | PRN

## 2021-12-30 MED ORDER — FOLIC ACID 1 MG PO TABS
5.0000 mg | ORAL_TABLET | Freq: Every day | ORAL | Status: DC
  Administered 2021-12-30 – 2022-01-08 (×10): 5 mg via ORAL
  Filled 2021-12-30 (×10): qty 5

## 2021-12-30 MED ORDER — FLUTICASONE PROPIONATE 50 MCG/ACT NA SUSP
2.0000 | Freq: Two times a day (BID) | NASAL | Status: DC
  Administered 2021-12-30 – 2022-01-08 (×19): 2 via NASAL
  Filled 2021-12-30 (×3): qty 16

## 2021-12-30 MED ORDER — DORZOLAMIDE HCL 2 % OP SOLN
1.0000 [drp] | Freq: Two times a day (BID) | OPHTHALMIC | Status: DC
  Administered 2021-12-30 – 2022-01-08 (×19): 1 [drp] via OPHTHALMIC
  Filled 2021-12-30 (×3): qty 10

## 2021-12-30 MED ORDER — ASPIRIN 81 MG PO CHEW
81.0000 mg | CHEWABLE_TABLET | Freq: Every day | ORAL | Status: DC
  Administered 2021-12-30 – 2022-01-08 (×10): 81 mg via ORAL
  Filled 2021-12-30 (×10): qty 1

## 2021-12-30 MED ORDER — SODIUM CHLORIDE 0.9% FLUSH
3.0000 mL | Freq: Two times a day (BID) | INTRAVENOUS | Status: DC
  Administered 2021-12-30 – 2022-01-08 (×16): 3 mL via INTRAVENOUS

## 2021-12-30 MED ORDER — ONDANSETRON HCL 4 MG/2ML IJ SOLN: 4.0000 mg | Freq: Four times a day (QID) | INTRAMUSCULAR | Status: DC | PRN

## 2021-12-30 MED ORDER — ACETAMINOPHEN 325 MG PO TABS
650.0000 mg | ORAL_TABLET | ORAL | Status: DC | PRN
  Administered 2021-12-30 – 2022-01-08 (×8): 650 mg via ORAL
  Filled 2021-12-30 (×8): qty 2

## 2021-12-30 MED ORDER — INSULIN ASPART 100 UNIT/ML IJ SOLN
0.0000 [IU] | Freq: Three times a day (TID) | INTRAMUSCULAR | Status: DC
  Administered 2021-12-30 – 2021-12-31 (×4): 1 [IU] via SUBCUTANEOUS
  Administered 2022-01-01: 12:00:00 2 [IU] via SUBCUTANEOUS
  Administered 2022-01-01: 16:00:00 1 [IU] via SUBCUTANEOUS
  Administered 2022-01-01 – 2022-01-03 (×5): 2 [IU] via SUBCUTANEOUS
  Administered 2022-01-03: 1 [IU] via SUBCUTANEOUS
  Administered 2022-01-04 (×3): 2 [IU] via SUBCUTANEOUS
  Administered 2022-01-05: 1 [IU] via SUBCUTANEOUS
  Administered 2022-01-05: 2 [IU] via SUBCUTANEOUS
  Administered 2022-01-05 – 2022-01-06 (×2): 1 [IU] via SUBCUTANEOUS
  Administered 2022-01-06 (×2): 2 [IU] via SUBCUTANEOUS
  Administered 2022-01-07: 1 [IU] via SUBCUTANEOUS
  Administered 2022-01-07: 2 [IU] via SUBCUTANEOUS
  Administered 2022-01-07: 3 [IU] via SUBCUTANEOUS
  Administered 2022-01-08: 1 [IU] via SUBCUTANEOUS

## 2021-12-30 MED ORDER — CYCLOBENZAPRINE HCL 5 MG PO TABS
5.0000 mg | ORAL_TABLET | Freq: Three times a day (TID) | ORAL | Status: DC
  Administered 2021-12-30 – 2022-01-02 (×11): 5 mg via ORAL
  Filled 2021-12-30 (×11): qty 1

## 2021-12-30 MED ORDER — ESCITALOPRAM OXALATE 10 MG PO TABS
10.0000 mg | ORAL_TABLET | Freq: Every day | ORAL | Status: DC
  Administered 2021-12-30 – 2022-01-08 (×10): 10 mg via ORAL
  Filled 2021-12-30 (×10): qty 1

## 2021-12-30 NOTE — Assessment & Plan Note (Addendum)
CKD stage 3a.  Renal function with serum cr at 1,46, elevated compared to yesterday, her urine output is 1.450 ml K is 4,1 and bicarbonate is 27.  Plan to hold on furosemide today and follow up renal function in am.

## 2021-12-30 NOTE — Progress Notes (Addendum)
Same day note  Patient seen and examined at bedside.  Patient was admitted to the hospital for lower extremity edema and weight gain.  With mild shortness of breath.  At the time of my evaluation, patient complains of cold and chilly.  Patient's son at bedside.  Having gross diuresis.  Physical examination reveals female with gross peripheral edema.  Diminished breath sounds bilaterally.  Covered with multiple blankets.   Laboratory data and imaging was reviewed  Assessment and Plan.  Acute on chronic heart failure with reduced ejection fraction.    Continue strict intake and output,  daily weights.  Check 2D echocardiogram.  Continue Lasix 40 mg IV daily.  Patient takes Lasix 40 PO daily at home.  TSH elevated at 11.9.  We will get free T3,T4.  COVID and influenza was negative.  BNP at 50.  No leukocytosis.  Resume home medication including losartan.  CKD stage IIIa.  Continue to monitor closely.  Continue to monitor on diuretics.    Diabetes mellitus type II.  Continue sliding scale insulin, Accu-Cheks diabetic diet.  Hold glipizide.  Continue to monitor blood glucose levels.  COPD.  Continue bronchodilators including budesonide nebulizer.  Continue to monitor.  Appears to be compensated at this time.  Hypothyroidism.  Resume Synthroid.  Essential hypertension.  Will hold Cardizem from home.  Continue losartan.  Abnormal urinalysis with positive nitrates leukocytes WBC.  Possible UTI.  Patient's son states that she gets confused when she has UTI.  We will start the patient on Rocephin IV.  Patient's son states that patient is on antibiotic prophylaxis for UTI at the skilled nursing facility.  Disposition.  Patient is from skilled nursing facility.  Likely disposition back to skilled nursing facility in 2 to 3 days.  Spoke with the patient's son at bedside.  No Charge  Signed,  Delila Pereyra, MD [facility]

## 2021-12-30 NOTE — ED Notes (Signed)
Breakfast order placed ?

## 2021-12-30 NOTE — Assessment & Plan Note (Addendum)
Fasting glucose this am is 174,  On insulin sliding scale for glucose cover and monitoring.

## 2021-12-30 NOTE — Assessment & Plan Note (Addendum)
No signs of acute exacerbation Continue with budesonide and as needed albuterol.

## 2021-12-30 NOTE — Progress Notes (Signed)
° °  Echocardiogram 2D Echocardiogram has been performed.  Debbie Arnold 12/30/2021, 11:54 AM

## 2021-12-30 NOTE — H&P (Addendum)
History and Physical    Patient: Debbie Arnold:[id number] DOB: 1927/02/11 DOA: 12/29/2021 DOS: the patient was seen and examined on 12/30/2021 PCP: Tempie Hoist, MD  Patient coming from: SNF  Chief Complaint: Edema  HPI: Debbie Arnold is a 88 y.o. female with medical history significant of HFrEF 40-45%, DM2, HTN.  Pt presents to ED due to swelling and edema.  Unsure of overall wt gain from baseline.  Mild SOB and cough.  No CP, abd pain, N/V.  Does take PO lasix at baseline.     Review of Systems: As mentioned in the history of present illness. All other systems reviewed and are negative. Past Medical History:  Diagnosis Date   Diabetes mellitus without complication (HCC)    HFrEF (heart failure with reduced ejection fraction) (HCC)    40-45%, mild MR, mod TR   Hypertension    Thyroid disease    History reviewed. No pertinent surgical history. Social History:  reports that she has never smoked. She has never used smokeless tobacco. She reports that she does not drink alcohol and does not use drugs.  Allergies  Allergen Reactions   Brimonidine Itching   Celecoxib Other (See Comments)   Red Dye Other (See Comments)   Codeine Anxiety    No family history on file.  Prior to Admission medications   Medication Sig Start Date End Date Taking? Authorizing Provider  acetaminophen (TYLENOL) 325 MG tablet Take 650 mg by mouth every 6 (six) hours as needed for pain.    [provider]  albuterol (PROVENTIL HFA;VENTOLIN HFA) 108 (90 BASE) MCG/ACT inhaler Inhale 2 puffs into the lungs every 6 (six) hours as needed for wheezing.    [provider]  amoxicillin (AMOXIL) 500 MG capsule TAKE ONE CAPSULE (500 MG DOSE) BY MOUTH 3 (THREE) TIMES A DAY. 03/24/18   [provider]  aspirin 81 MG tablet Take 81 mg by mouth daily.    [provider]  azelastine (OPTIVAR) 0.05 % ophthalmic solution Apply to eye. 03/16/18   [provider]   Brinzolamide-Brimonidine [facility] OP) Place 1 drop into both eyes 3 (three) times daily.     [provider]  Calcium & Magnesium Carbonates (MYLANTA PO) Take 30 mL by mouth daily as needed (constipation).    [provider]  Cholecalciferol (VITAMIN D-3) 5000 UNITS TABS Take 5,000 Units by mouth daily.     [provider]  clonazePAM (KLONOPIN) 0.5 MG tablet Take 0.5 mg by mouth 2 (two) times daily as needed for anxiety.    [provider]  cyclopentolate (CYCLODRYL,CYCLOGYL) 1 % ophthalmic solution  04/07/17   [provider]  dorzolamide (TRUSOPT) 2 % ophthalmic solution Place 1 drop into both eyes 2 (two) times daily. 11/05/17   [provider]  doxazosin (CARDURA) 8 MG tablet Take 8 mg by mouth daily.     [provider]  doxepin (SINEQUAN) 10 MG capsule Take 20 mg by mouth at bedtime. 05/14/18   [provider]  DULoxetine (CYMBALTA) 30 MG capsule Take 30 mg by mouth daily.    [provider]  ferrous sulfate 325 (65 FE) MG tablet Take 325 mg by mouth daily with breakfast.    [provider]  fexofenadine (ALLEGRA) 60 MG tablet Take by mouth. 03/16/18   [provider]  furosemide (LASIX) 20 MG tablet Take 20 mg by mouth daily.     [provider]  gabapentin (NEURONTIN) 100 MG capsule Take 300  mg by mouth 3 (three) times daily.     [provider]  glipiZIDE (GLUCOTROL XL) 10 MG 24 hr tablet Take 5 mg by mouth daily.     [provider]  ketorolac (ACULAR) 0.5 % ophthalmic solution 1 drop 4 (four) times daily.    [provider]  levothyroxine (SYNTHROID, LEVOTHROID) 112 MCG tablet Take 112 mcg by mouth daily before breakfast.    [provider]  multivitamin-iron-minerals-folic acid (CENTRUM) chewable tablet Chew 1 tablet by mouth daily.    [provider]  nitrofurantoin, macrocrystal-monohydrate, (MACROBID) 100 MG capsule Take 1 capsule (100  mg total) by mouth 2 (two) times daily. 08/08/21     omeprazole (PRILOSEC) 20 MG capsule Take 20 mg by mouth 2 (two) times daily.     [provider]  polyethylene glycol powder (GLYCOLAX) powder Take 17 g by mouth 2 (two) times daily. 07/21/13   Bonk, John-Adam, MD  Simethicone (GAS-X PO) Take 2 tablets by mouth 2 (two) times daily as needed (gas).    [provider]  simvastatin (ZOCOR) 20 MG tablet Take 20 mg by mouth every evening.    [provider]  traMADol (ULTRAM) 50 MG tablet Take 50 mg by mouth every 6 (six) hours as needed for pain.    [provider]  verapamil (VERELAN PM) 360 MG 24 hr capsule Take 360 mg by mouth at bedtime.    [provider]    Physical Exam: Vitals:   12/29/21 2145 12/29/21 2230 12/29/21 2330 12/30/21 0000  BP: (!) 164/69 (!) 166/64 (!) 155/70   Pulse: 74 77 77   Resp: 17 (!) 23 13   Temp:      TempSrc:      SpO2: 97% 96% 94%   Weight:    111.1 kg  Height:    5\' 5"  (1.651 m)     Constitutional: NAD, calm, comfortable Eyes: PERRL, lids and conjunctivae normal ENMT: Mucous membranes are moist. Posterior pharynx clear of any exudate or lesions.Normal dentition.  Neck: normal, supple, no masses, no thyromegaly Respiratory: clear to auscultation bilaterally, no wheezing, no crackles. Normal respiratory effort. No accessory muscle use.  Cardiovascular: Regular rate and rhythm, no murmurs / rubs / gallops. 4+ BLE extremity edema. 2+ pedal pulses. No carotid bruits.  Abdomen: no tenderness, no masses palpated. No hepatosplenomegaly. Bowel sounds positive.  Musculoskeletal: no clubbing / cyanosis. No joint deformity upper and lower extremities. Good ROM, no contractures. Normal muscle tone.  Skin: no rashes, lesions, ulcers. No induration Neurologic: CN 2-12 grossly intact. Sensation intact, DTR normal. Strength 5/5 in all 4.  Psychiatric: Normal judgment and insight. Alert and oriented x 3. Normal mood.     Data  Reviewed:  Pulm vasc congestion on CXR Creat 1.09, BUN 34  UA pending.  Assessment/Plan * Acute on chronic HFrEF (heart failure with reduced ejection fraction) ([place])- (present on admission) EF 40-45% on prior echos CHF pathway Strict intake and output Daily BMP with diuresis Tele monitor 2d echo Lasix 40mg  IV now and 40mg  IV daily  CKD (chronic kidney disease) stage 3, GFR 30-59 ml/min (H[facility])- (present on admission) Appears stable for the moment, monitor daily BMP and UOP with diuresis.  Hypothyroidism- (present on admission) Cont synthroid when med rec completed Check TSH  Diabetes mellitus type 2 in obese [facility]- (present on admission) Hold glipizide Sensitive SSI AC/HS  COPD (chronic obstructive pulmonary disease) ([place])- (present on admission) Continue home nebs.  HTN (hypertension)- (present on admission)  Cont BP meds when med rec completed.    Advance Care Planning:   Code Status: Full Code   Consults: None  Family Communication: No family in room  Severity of Illness: The appropriate patient status for this patient is OBSERVATION. Observation status is judged to be reasonable and necessary in order to provide the required intensity of service to ensure the patient's safety. The patient's presenting symptoms, physical exam findings, and initial radiographic and laboratory data in the context of their medical condition is felt to place them at decreased risk for further clinical deterioration. Furthermore, it is anticipated that the patient will be medically stable for discharge from the hospital within 2 midnights of admission.   Author: Etta Quill., DO 12/30/2021 12:26 AM  For on call review [email or web address].

## 2021-12-30 NOTE — Assessment & Plan Note (Addendum)
Continue with levothyroxine  

## 2021-12-30 NOTE — Assessment & Plan Note (Addendum)
On losartan and metoprolol. Blood pressure has been more stable.

## 2021-12-30 NOTE — Assessment & Plan Note (Addendum)
Echocardiogram with preserved LV systolic function with EF 55 to 60%, RV preserved systolic function, no significant valvular disease is evident.   Continue with empagliflozin, losartan and metoprolol.  Hold oral furosemide today due to elevated cr   Patient very weak and deconditioned.

## 2021-12-31 LAB — GLUCOSE, CAPILLARY
Glucose-Capillary: 143 mg/dL — ABNORMAL HIGH (ref 70–99)
Glucose-Capillary: 144 mg/dL — ABNORMAL HIGH (ref 70–99)
Glucose-Capillary: 197 mg/dL — ABNORMAL HIGH (ref 70–99)

## 2021-12-31 LAB — CBC
HCT: 36 % (ref 36.0–46.0)
Hemoglobin: 11 g/dL — ABNORMAL LOW (ref 12.0–15.0)
MCH: 31.1 pg (ref 26.0–34.0)
MCHC: 30.6 g/dL (ref 30.0–36.0)
MCV: 101.7 fL — ABNORMAL HIGH (ref 80.0–100.0)
Platelets: 195 10*3/uL (ref 150–400)
RBC: 3.54 MIL/uL — ABNORMAL LOW (ref 3.87–5.11)
RDW: 13.3 % (ref 11.5–15.5)
WBC: 5 10*3/uL (ref 4.0–10.5)
nRBC: 0 % (ref 0.0–0.2)

## 2021-12-31 LAB — MAGNESIUM: Magnesium: 1.8 mg/dL (ref 1.7–2.4)

## 2021-12-31 LAB — BASIC METABOLIC PANEL
Anion gap: 9 (ref 5–15)
BUN: 35 mg/dL — ABNORMAL HIGH (ref 8–23)
CO2: 27 mmol/L (ref 22–32)
Calcium: 8.9 mg/dL (ref 8.9–10.3)
Chloride: 100 mmol/L (ref 98–111)
Creatinine, Ser: 1.09 mg/dL — ABNORMAL HIGH (ref 0.44–1.00)
GFR, Estimated: 48 mL/min — ABNORMAL LOW (ref >60)
Glucose, Bld: 130 mg/dL — ABNORMAL HIGH (ref 70–99)
Potassium: 4.3 mmol/L (ref 3.5–5.1)
Sodium: 136 mmol/L (ref 135–145)

## 2021-12-31 LAB — HEMOGLOBIN A1C
Hgb A1c MFr Bld: 7.5 % — ABNORMAL HIGH (ref 4.8–5.6)
Mean Plasma Glucose: 169 mg/dL

## 2021-12-31 LAB — CBG MONITORING, ED: Glucose-Capillary: 119 mg/dL — ABNORMAL HIGH (ref 70–99)

## 2021-12-31 LAB — PHOSPHORUS: Phosphorus: 3.9 mg/dL (ref 2.5–4.6)

## 2021-12-31 MED ORDER — DILTIAZEM HCL ER COATED BEADS 180 MG PO CP24
180.0000 mg | ORAL_CAPSULE | Freq: Every day | ORAL | Status: DC
  Administered 2021-12-31 – 2022-01-02 (×3): 180 mg via ORAL
  Filled 2021-12-31 (×4): qty 1

## 2021-12-31 MED ORDER — DILTIAZEM HCL ER BEADS 180 MG PO CP24: 180.0000 mg | ORAL_CAPSULE | Freq: Every day | ORAL | Status: DC

## 2021-12-31 NOTE — ED Notes (Signed)
Breakfast orders placed 

## 2021-12-31 NOTE — TOC Progression Note (Addendum)
Transition of Care [facility]) - Progression Note    Patient Details  Name: TERISHA LOSASSO MRN: [id number] Date of Birth: 12/23/1926  Transition of Care [facility]) CM/SW Contact  Zenon Mayo, RN Phone Number: 12/31/2021, 4:15 PM  Clinical Narrative:     Transition of Care [facility]) Screening Note   Patient Details  Name: KHYLAH KENDRA Date of Birth: 25-Sep-1927   Transition of Care [facility]) CM/SW Contact:    Zenon Mayo, RN Phone Number: 12/31/2021, 4:16 PM    Transition of Care Department [facility]) has reviewed patient and no TOC needs have been identified at this time. We will continue to monitor patient advancement through interdisciplinary progression rounds. If new patient transition needs arise, please place a TOC consult.    1/24- per son, [name] , patient is from [place] on [address], he states she really needs SNF level.  NCM informed him will have CSW to speak with him tomorrow regarding this.       Expected Discharge Plan and Services                                                 Social Determinants of Health (SDOH) Interventions    Readmission Risk Interventions No flowsheet data found.

## 2021-12-31 NOTE — Plan of Care (Signed)
°  Problem: Education: °Goal: Ability to demonstrate management of disease process will improve °Outcome: Progressing °  °Problem: Education: °Goal: Ability to verbalize understanding of medication therapies will improve °Outcome: Progressing °  °Problem: Activity: °Goal: Capacity to carry out activities will improve °Outcome: Progressing °  °

## 2021-12-31 NOTE — Progress Notes (Addendum)
PROGRESS NOTE  Debbie Arnold BHA:[id number] DOB: 1927-06-26 DOA: 12/29/2021 PCP: Tempie Hoist, MD   LOS: 1 day   Brief narrative: Debbie Arnold is a 88 y.o. female with past medical history of heart failure , diabetes mellitus type 2, hypertension presented hospital with bilateral lower extremity swelling and overall weight gain with mild shortness of breath and cough.  Patient denies any chest pain, abdominal pain nausea vomiting and has been taking p.o. Lasix at baseline but despite that has been having increasing leg swelling.  Patient was then admitted hospital for acute CHF exacerbation.  Assessment/Plan:  Principal Problem:   Acute on chronic HFrEF (heart failure with reduced ejection fraction) (HCC) Active Problems:   HTN (hypertension)   COPD (chronic obstructive pulmonary disease) (HCC)   Diabetes mellitus type 2 in obese (HCC)   Hypothyroidism   CKD (chronic kidney disease) stage 3, GFR 30-59 ml/min (HCC)   CHF (congestive heart failure) ([place])  Acute on chronic heart failure with reduced ejection fraction.    Continue strict intake and output,  daily weights.  2D echocardiogram this admission shows LV ejection fraction of 55 to 60% with grade 1 diastolic dysfunction but history of reduced EF at 40 to 45% in the past . Continue Lasix 40 mg IV daily.  Patient takes Lasix 40 PO daily at home.  TSH elevated at 11.9.  Free T3-T4 pending.  COVID and influenza was negative.  BNP at 50.  No leukocytosis.  Continue losartan.  Continue diuretics for now.  Has been having good diuresis and feels better.   CKD stage IIIa.  Continue to monitor closely.  Continue to monitor creatinine on diuretics.     Diabetes mellitus type II.  Continue sliding scale insulin, Accu-Cheks diabetic diet.  Hold glipizide.  Continue to monitor blood glucose levels.  Latest POC glucose of 119   COPD.  Continue bronchodilators including budesonide nebulizer.  Continue to monitor.  Appears to be compensated at  this time.   Hypothyroidism.  Continue Synthroid.   Essential hypertension.  Will restart Cardizem from home.  Continue losartan.   Possible UTI On IV Rocephin.  We will continue 3-day course.  On nitrofurantoin for suppressive therapy.  Right toe pain.   Had surgery on the neck in the past.  Distal capillary refill noted.  We will keep an eye on it.  Left shoulder pain.  Has a TENS unit.  Supposed to have shoulder surgery in the future  Disposition.  Patient is from assisted living facility.  Likely disposition  to assisted living/skilled nursing facility in 2 to 3 days. Will get PT, OT evaluation.   DVT prophylaxis: enoxaparin (LOVENOX) injection 40 mg Start: 12/30/21 1000  Code Status: Full code  Family Communication: Spoke with the patient's son and updated him about the clinical condition of the patient.  Status is: Inpatient  Remains inpatient appropriate because: IV diuresis, fluid overload,  Consultants: None  Procedures: None  Anti-infectives:  Rocephin IV  Anti-infectives (From admission, onward)    Start     Dose/Rate Route Frequency Ordered Stop   12/30/21 0930  cefTRIAXone (ROCEPHIN) 1 g in sodium chloride 0.9 % 100 mL IVPB        1 g 200 mL/hr over 30 Minutes Intravenous Every 24 hours 12/30/21 0916        Subjective: Today, patient was seen and examined at bedside.  Patient complains of a headache and some shoulder pain today. Complains of right toe pain.   States  that her breathing is better than yesterday and has been urinating well.  Objective: Vitals:   12/31/21 0200 12/31/21 0702  BP: (!) 123/55 (!) 147/61  Pulse: 79 82  Resp: 14 20  Temp:    SpO2: 94% 93%    Intake/Output Summary (Last 24 hours) at 12/31/2021 1043 Last data filed at 12/31/2021 0000 Gross per 24 hour  Intake 100 ml  Output 600 ml  Net -500 ml   Filed Weights   12/30/21 0000  Weight: 111.1 kg   Body mass index is 40.77 kg/m.   Physical Exam:  GENERAL: Patient  is alert awake and oriented. Not in obvious distress.  Obese HENT: No scleral pallor or icterus. Pupils equally reactive to light. Oral mucosa is moist. NECK: is supple, no gross swelling noted. CHEST: Diminished breath sounds bilaterally. CVS: S1 and S2 heard, no murmur. Regular rate and rhythm.  ABDOMEN: Soft, non-tender, bowel sounds are present. EXTREMITIES: Bilateral lower extremity pitting edema noted.  Improved from yesterday.  Mild discoloration of the right great toe but distal capillary refill present.  Warm extremities. CNS: Cranial nerves are intact. No focal motor deficits. SKIN: warm and dry without rashes.  Data Review: I have personally reviewed the following laboratory data and studies,  CBC: Recent Labs  Lab 12/29/21 2134 12/31/21 0340  WBC 5.1 5.0  NEUTROABS 2.5  --   HGB 11.8* 11.0*  HCT 36.2 36.0  MCV 99.5 101.7*  PLT 201 811   Basic Metabolic Panel: Recent Labs  Lab 12/29/21 2134 12/31/21 0340  NA 138 136  K 4.7 4.3  CL 101 100  CO2 27 27  GLUCOSE 235* 130*  BUN 34* 35*  CREATININE 1.09* 1.09*  CALCIUM 9.3 8.9  MG  --  1.8  PHOS  --  3.9   Liver Function Tests: Recent Labs  Lab 12/29/21 2134  AST 13*  ALT 12  ALKPHOS 43  BILITOT 0.3  PROT 7.1  ALBUMIN 3.7   No results for input(s): LIPASE, AMYLASE in the last 168 hours. No results for input(s): AMMONIA in the last 168 hours. Cardiac Enzymes: No results for input(s): CKTOTAL, CKMB, CKMBINDEX, TROPONINI in the last 168 hours. BNP (last 3 results) Recent Labs    12/29/21 2134  BNP 50.5    ProBNP (last 3 results) No results for input(s): PROBNP in the last 8760 hours.  CBG: Recent Labs  Lab 12/30/21 0725 12/30/21 1203 12/30/21 1758 12/30/21 2117 12/31/21 0740  GLUCAP 146* 140* 114* 147* 119*   Recent Results (from the past 240 hour(s))  Resp Panel by RT-PCR (Flu A&B, Covid) Nasopharyngeal Swab     Status: None   Collection Time: 12/30/21 12:15 AM   Specimen:  Nasopharyngeal Swab; Nasopharyngeal(NP) swabs in vial transport medium  Result Value Ref Range Status   SARS Coronavirus 2 by RT PCR NEGATIVE NEGATIVE Final    Comment: (NOTE) SARS-CoV-2 target nucleic acids are NOT DETECTED.  The SARS-CoV-2 RNA is generally detectable in upper respiratory specimens during the acute phase of infection. The lowest concentration of SARS-CoV-2 viral copies this assay can detect is 138 copies/mL. A negative result does not preclude SARS-Cov-2 infection and should not be used as the sole basis for treatment or other patient management decisions. A negative result may occur with  improper specimen collection/handling, submission of specimen other than nasopharyngeal swab, presence of viral mutation(s) within the areas targeted by this assay, and inadequate number of viral copies(<138 copies/mL). A negative result must be combined with  clinical observations, patient history, and epidemiological information. The expected result is Negative.  Fact Sheet for Patients:  EntrepreneurPulse.com.au  Fact Sheet for Healthcare Providers:  IncredibleEmployment.be  This test is no t yet approved or cleared by the [place] FDA and  has been authorized for detection and/or diagnosis of SARS-CoV-2 by FDA under an Emergency Use Authorization (EUA). This EUA will remain  in effect (meaning this test can be used) for the duration of the COVID-19 declaration under Section 564(b)(1) of the Act, 21 U.S.C.section 360bbb-3(b)(1), unless the authorization is terminated  or revoked sooner.       Influenza A by PCR NEGATIVE NEGATIVE Final   Influenza B by PCR NEGATIVE NEGATIVE Final    Comment: (NOTE) The Xpert Xpress SARS-CoV-2/FLU/RSV plus assay is intended as an aid in the diagnosis of influenza from Nasopharyngeal swab specimens and should not be used as a sole basis for treatment. Nasal washings and aspirates are unacceptable for  Xpert Xpress SARS-CoV-2/FLU/RSV testing.  Fact Sheet for Patients: EntrepreneurPulse.com.au  Fact Sheet for Healthcare Providers: IncredibleEmployment.be  This test is not yet approved or cleared by the [place] FDA and has been authorized for detection and/or diagnosis of SARS-CoV-2 by FDA under an Emergency Use Authorization (EUA). This EUA will remain in effect (meaning this test can be used) for the duration of the COVID-19 declaration under Section 564(b)(1) of the Act, 21 U.S.C. section 360bbb-3(b)(1), unless the authorization is terminated or revoked.  Performed at [facility], [place] [address] [place],  [address]      Studies: DG Chest Portable 1 View  Result Date: 12/29/2021 CLINICAL DATA:  Dyspnea EXAM: PORTABLE CHEST 1 VIEW COMPARISON:  09/20/2017 FINDINGS: Cardiomegaly with central congestion. Linear scarring or atelectasis in the right mid lung. Aortic atherosclerosis. No pleural effusion or pneumothorax IMPRESSION: Cardiomegaly with mild central congestion Electronically Signed   By: Donavan Foil M.D.   On: 12/29/2021 21:13   ECHOCARDIOGRAM COMPLETE  Result Date: 12/30/2021    ECHOCARDIOGRAM REPORT   Patient Name:   AZRAEL HUSS Date of Exam: 12/30/2021 Medical Rec #:  [id number]     Height:       65.0 in Accession #:    [id number]    Weight:       245.0 lb Date of Birth:  May 27, 1927     BSA:          2.156 m Patient Age:    78 years      BP:           147/60 mmHg Patient Gender: F             HR:           83 bpm. Exam Location:  Inpatient Procedure: 2D Echo, Cardiac Doppler and Color Doppler Indications:    ACUTE CHF SYSTOLIC  History:        Patient has no prior history of Echocardiogram examinations.                 Risk Factors:Diabetes and Hypertension. HF r EF.  Sonographer:    Beryle Beams Referring Phys: [phone] JARED M GARDNER  Sonographer Comments: Suboptimal parasternal window, suboptimal apical window, no  subcostal window and patient is morbidly obese. Image acquisition challenging due to respiratory motion and Image acquisition challenging due to patient body habitus. IMPRESSIONS  1. Left ventricular ejection fraction, by estimation, is 55 to 60%. The left ventricle has normal function. The left ventricle has no regional wall  motion abnormalities. There is mild left ventricular hypertrophy. Left ventricular diastolic parameters are consistent with Grade I diastolic dysfunction (impaired relaxation).  2. Right ventricular systolic function is normal. The right ventricular size is normal.  3. The mitral valve is normal in structure. No evidence of mitral valve regurgitation. No evidence of mitral stenosis.  4. Valve not well seen Mild gradients and DVI 0.49 but valve area calculates low Doubt significant AS . The aortic valve was not well visualized. There is mild calcification of the aortic valve. There is mild thickening of the aortic valve. Aortic valve  regurgitation is not visualized. Aortic valve sclerosis is present, with no evidence of aortic valve stenosis.  5. The inferior vena cava is normal in size with greater than 50% respiratory variability, suggesting right atrial pressure of 3 mmHg. FINDINGS  Left Ventricle: Left ventricular ejection fraction, by estimation, is 55 to 60%. The left ventricle has normal function. The left ventricle has no regional wall motion abnormalities. The left ventricular internal cavity size was normal in size. There is  mild left ventricular hypertrophy. Left ventricular diastolic parameters are consistent with Grade I diastolic dysfunction (impaired relaxation). Right Ventricle: The right ventricular size is normal. No increase in right ventricular wall thickness. Right ventricular systolic function is normal. Left Atrium: Left atrial size was normal in size. Right Atrium: Right atrial size was normal in size. Pericardium: There is no evidence of pericardial effusion. Mitral  Valve: The mitral valve is normal in structure. No evidence of mitral valve regurgitation. No evidence of mitral valve stenosis. Tricuspid Valve: The tricuspid valve is normal in structure. Tricuspid valve regurgitation is not demonstrated. No evidence of tricuspid stenosis. Aortic Valve: Valve not well seen Mild gradients and DVI 0.49 but valve area calculates low Doubt significant AS. The aortic valve was not well visualized. There is mild calcification of the aortic valve. There is mild thickening of the aortic valve. Aortic valve regurgitation is not visualized. Aortic valve sclerosis is present, with no evidence of aortic valve stenosis. Aortic valve mean gradient measures 4.0 mmHg. Aortic valve peak gradient measures 8.1 mmHg. Aortic valve area, by VTI measures 1.39 cm. Pulmonic Valve: The pulmonic valve was normal in structure. Pulmonic valve regurgitation is not visualized. No evidence of pulmonic stenosis. Aorta: The aortic root is normal in size and structure. Venous: The inferior vena cava is normal in size with greater than 50% respiratory variability, suggesting right atrial pressure of 3 mmHg. IAS/Shunts: No atrial level shunt detected by color flow Doppler.  LEFT VENTRICLE PLAX 2D LVIDd:         4.40 cm LVIDs:         1.60 cm LV PW:         1.70 cm LV IVS:        1.30 cm LVOT diam:     1.90 cm LV SV:         38 LV SV Index:   18 LVOT Area:     2.84 cm  LV Volumes (MOD) LV vol d, MOD A2C: 26.6 ml LV vol d, MOD A4C: 48.4 ml LV vol s, MOD A2C: 11.7 ml LV vol s, MOD A4C: 25.9 ml LV SV MOD A2C:     14.9 ml LV SV MOD A4C:     48.4 ml LV SV MOD BP:      18.2 ml RIGHT VENTRICLE RV S prime:     12.50 cm/s TAPSE (M-mode): 2.3 cm LEFT ATRIUM  Index LA diam:        3.00 cm 1.39 cm/m LA Vol (A2C):   52.7 ml 24.44 ml/m LA Vol (A4C):   32.7 ml 15.16 ml/m LA Biplane Vol: 45.1 ml 20.92 ml/m  AORTIC VALVE AV Area (Vmax):    1.28 cm AV Area (Vmean):   1.21 cm AV Area (VTI):     1.39 cm AV Vmax:            142.00 cm/s AV Vmean:          97.200 cm/s AV VTI:            0.276 m AV Peak Grad:      8.1 mmHg AV Mean Grad:      4.0 mmHg LVOT Vmax:         63.90 cm/s LVOT Vmean:        41.600 cm/s LVOT VTI:          0.135 m LVOT/AV VTI ratio: 0.49  AORTA Ao Root diam: 4.10 cm MITRAL VALVE MV Area (PHT): 2.50 cm     SHUNTS MV Decel Time: 303 msec     Systemic VTI:  0.14 m MV E velocity: 85.20 cm/s   Systemic Diam: 1.90 cm MV A velocity: 153.00 cm/s MV E/A ratio:  0.56 Jenkins Rouge MD Electronically signed by Jenkins Rouge MD Signature Date/Time: 12/30/2021/12:04:59 PM    Final       Flora Lipps, MD  [facility] 12/31/2021  If 7PM-7AM, please contact night-coverage

## 2021-12-31 NOTE — Plan of Care (Signed)
?  Problem: Education: ?Goal: Ability to demonstrate management of disease process will improve ?Outcome: Progressing ?  ?Problem: Education: ?Goal: Ability to verbalize understanding of medication therapies will improve ?Outcome: Progressing ?  ?Problem: Cardiac: ?Goal: Ability to achieve and maintain adequate cardiopulmonary perfusion will improve ?Outcome: Progressing ?  ?

## 2022-01-01 DIAGNOSIS — E785 Hyperlipidemia, unspecified: Secondary | ICD-10-CM

## 2022-01-01 DIAGNOSIS — N390 Urinary tract infection, site not specified: Secondary | ICD-10-CM

## 2022-01-01 DIAGNOSIS — F32A Depression, unspecified: Secondary | ICD-10-CM

## 2022-01-01 LAB — BASIC METABOLIC PANEL
Anion gap: 9 (ref 5–15)
BUN: 42 mg/dL — ABNORMAL HIGH (ref 8–23)
CO2: 29 mmol/L (ref 22–32)
Calcium: 9.2 mg/dL (ref 8.9–10.3)
Chloride: 99 mmol/L (ref 98–111)
Creatinine, Ser: 1.13 mg/dL — ABNORMAL HIGH (ref 0.44–1.00)
GFR, Estimated: 46 mL/min — ABNORMAL LOW (ref >60)
Glucose, Bld: 153 mg/dL — ABNORMAL HIGH (ref 70–99)
Potassium: 4.2 mmol/L (ref 3.5–5.1)
Sodium: 137 mmol/L (ref 135–145)

## 2022-01-01 LAB — GLUCOSE, CAPILLARY
Glucose-Capillary: 170 mg/dL — ABNORMAL HIGH (ref 70–99)
Glucose-Capillary: 157 mg/dL — ABNORMAL HIGH (ref 70–99)
Glucose-Capillary: 133 mg/dL — ABNORMAL HIGH (ref 70–99)
Glucose-Capillary: 164 mg/dL — ABNORMAL HIGH (ref 70–99)

## 2022-01-01 LAB — T4, FREE: Free T4: 1.01 ng/dL (ref 0.61–1.12)

## 2022-01-01 LAB — CBC
HCT: 34.8 % — ABNORMAL LOW (ref 36.0–46.0)
Hemoglobin: 11.2 g/dL — ABNORMAL LOW (ref 12.0–15.0)
MCH: 31.6 pg (ref 26.0–34.0)
MCHC: 32.2 g/dL (ref 30.0–36.0)
MCV: 98.3 fL (ref 80.0–100.0)
Platelets: 203 10*3/uL (ref 150–400)
RBC: 3.54 MIL/uL — ABNORMAL LOW (ref 3.87–5.11)
RDW: 13.2 % (ref 11.5–15.5)
WBC: 5.1 10*3/uL (ref 4.0–10.5)
nRBC: 0 % (ref 0.0–0.2)

## 2022-01-01 LAB — MRSA NEXT GEN BY PCR, NASAL: MRSA by PCR Next Gen: NOT DETECTED

## 2022-01-01 LAB — PHOSPHORUS: Phosphorus: 4.5 mg/dL (ref 2.5–4.6)

## 2022-01-01 LAB — MAGNESIUM: Magnesium: 2 mg/dL (ref 1.7–2.4)

## 2022-01-01 MED ORDER — FUROSEMIDE 10 MG/ML IJ SOLN
40.0000 mg | Freq: Two times a day (BID) | INTRAMUSCULAR | Status: DC
  Administered 2022-01-01 – 2022-01-02 (×2): 40 mg via INTRAVENOUS
  Filled 2022-01-01 (×2): qty 4

## 2022-01-01 NOTE — Evaluation (Signed)
Physical Therapy Evaluation Patient Details Name: Debbie Arnold MRN: [id number] DOB: 06-Jul-1927 Today's Date: 01/01/2022  History of Present Illness  Pt is a 88 y.o. female who presented 12/29/21 with edema and SOB. Pt admitted with CHF exacerbation. PMH: DM2, HTN, CHF, COPD, thyroid disease   Clinical Impression  Pt presents with condition above and deficits mentioned below, see PT Problem List. PTA, she was residing at an ALF with +2 assistance from caregivers paid for by the family. She reports she has been able to stand with +2 assistance in the parallel bars but has not taken steps in ~1 year, otherwise she uses a hoyer lift to transfer between surfaces. Currently, pt displays deficits in cognition, balance, activity tolerance, and upper and lower extremity ROM and strength. She required maxA - TA for bed mobility. She was able to initiate the main muscle groups to begin to lift her buttocks from elevated EOB with maxA, but unable to clear it today. Focused remainder of session on addressing her limitations in trunk and upper and lower extremity ROM and strength while sitting EOB. Recommending pt receive therapy services at SNF ( if ALF is able to manage at current care level then could return). Will continue to follow acutely.     Recommendations for follow up therapy are one component of a multi-disciplinary discharge planning process, led by the attending physician.  Recommendations may be updated based on patient status, additional functional criteria and insurance authorization.  Follow Up Recommendations Skilled nursing-short term rehab (<3 hours/day)    Assistance Recommended at Discharge Frequent or constant Supervision/Assistance  Patient can return home with the following  Two people to help with walking and/or transfers;A lot of help with walking and/or transfers;A lot of help with bathing/dressing/bathroom;Two people to help with bathing/dressing/bathroom;Assistance with  cooking/housework;Direct supervision/assist for medications management;Direct supervision/assist for financial management;Assist for transportation;Help with stairs or ramp for entrance    Equipment Recommendations Wheelchair (measurements PT);Wheelchair cushion (measurements PT);Hospital bed;Other (comment);BSC/3in1 (hoyer lift)  Recommendations for Other Services       Functional Status Assessment Patient has had a recent decline in their functional status and demonstrates the ability to make significant improvements in function in a reasonable and predictable amount of time.     Precautions / Restrictions Precautions Precautions: Fall Precaution Comments: uses hoyer lift baseline Restrictions Weight Bearing Restrictions: No      Mobility  Bed Mobility Overal bed mobility: Needs Assistance Bed Mobility: Rolling, Sidelying to Sit, Sit to Supine Rolling: Max assist Sidelying to sit: Max assist, HOB elevated   Sit to supine: Total assist   General bed mobility comments: Pt requiring extra time and assistance to flex knee for foot placement on bed, cuing pt to initiate hip rotation through tactile cues at ASIS and to reach to contralateral rail, maxA to roll either direction. MaxA to bring legs into flexion off EOB and assist trunk up to sit, cuing pt to push up on R elbow with pt initiating trunk movement. TA to control trunk and legs back to supine and midline.    Transfers Overall transfer level: Needs assistance Equipment used: 1 person hand held assist Transfers: Sit to/from Stand Sit to Stand: Max assist, From elevated surface           General transfer comment: Facilitated quads activation through mini-sit to stand attempts at elevated EOB with PT anterior to pt providing bil knee block. Pt initiating quads activation and beginning to elevate buttocks off bed but never fully clearing it,  x3 reps.    Ambulation/Gait               General Gait Details:  Deferred  Stairs            Wheelchair Mobility    Modified Rankin (Stroke Patients Only)       Balance Overall balance assessment: Needs assistance Sitting-balance support: Single extremity supported, Bilateral upper extremity supported, Feet supported Sitting balance-Leahy Scale: Poor Sitting balance - Comments: Reliant on UE support and min guard-minA to sit EOB, cuing pt to rotate trunk and reach off BOS to facilitate anterior pelvic tilt, trunk extension and rotation, and scapular retraction and adduction. Postural control: Posterior lean (mild)     Standing balance comment: Began to place some weight through legs at elevated EOB to facilitate quads and glut activation, never fully clearing buttocks.                             Pertinent Vitals/Pain Pain Assessment Pain Assessment: Faces Faces Pain Scale: Hurts even more Pain Location: shoulders bil and legs Pain Descriptors / Indicators: Constant, Grimacing, Guarding, Discomfort Pain Intervention(s): Monitored during session, Limited activity within patient's tolerance, Repositioned, Other (comment) (TENS unit in place on L shoulder)    Home Living Family/patient expects to be discharged to:: Skilled nursing facility                        Prior Function Prior Level of Function : Needs assist             Mobility Comments: Reports she was standing in parallel bars with +2 (A) but has not taken steps for ~1 year. ADLs Comments: hoyer lift to bath chair friday and mondays at facility. family paying for two caregivers as pt does not like to be moved with 1 person (A) due to pain.     Hand Dominance   Dominant Hand: Right    Extremity/Trunk Assessment   Upper Extremity Assessment Upper Extremity Assessment: Defer to OT evaluation (decreased bil scapular upward rotation ROM and thus shoulder flexion ROM)    Lower Extremity Assessment Lower Extremity Assessment: RLE deficits/detail;LLE  deficits/detail RLE Deficits / Details: Gross functional weakness on R with difficulty flexing knee supine in bed, even with assist; MMT of 4 with knee extension; denies numbness/tingling bil LLE Deficits / Details: Improved ease with flexing knee in bed with AAROM compared to R, so increased gross functional strength on L; MMT of 4 with knee extension; denies numbness/tingling bil    Cervical / Trunk Assessment Cervical / Trunk Assessment: Kyphotic  Communication   Communication: HOH  Cognition Arousal/Alertness: Awake/alert Behavior During Therapy: WFL for tasks assessed/performed Overall Cognitive Status: Impaired/Different from baseline Area of Impairment: Awareness, Problem solving, Memory                     Memory: Decreased short-term memory     Awareness: Emergent Problem Solving: Slow processing, Difficulty sequencing, Requires verbal cues, Requires tactile cues General Comments: Pt unaware of bowel movement. Pt repeating comments at times or tangentially speaking about surgeries discussed earlier. Requires extra time and multi-modal cues to sequence tasks.        General Comments General comments (skin integrity, edema, etc.): Daughter present during session; encouraged pt to get up with nursing using hoyer lift    Exercises Other Exercises Other Exercises: AAROM R scaular upward rotation with shoulder flexion to 90',  10x Other Exercises: Trunk rotation with pt holding PT's arms while sitting EOB to improve trunk ROM Other Exercises: Anterior pelvic tilts with scapular restraction and adduction AROM sitting EOB Other Exercises: modified sit-ups at EOB, 5x Other Exercises: Mini- sit to stand attempts to activate quads and gluts at elevated EOB, 3x   Assessment/Plan    PT Assessment Patient needs continued PT services  PT Problem List Decreased strength;Decreased range of motion;Decreased activity tolerance;Decreased balance;Decreased mobility;Decreased  coordination;Decreased cognition;Cardiopulmonary status limiting activity;Obesity;Pain       PT Treatment Interventions DME instruction;Functional mobility training;Therapeutic activities;Therapeutic exercise;Balance training;Neuromuscular re-education;Cognitive remediation;Patient/family education;Wheelchair mobility training    PT Goals (Current goals can be found in the Care Plan section)  Acute Rehab PT Goals Patient Stated Goal: to sit up and improve PT Goal Formulation: With patient/family Time For Goal Achievement: 01/15/22 Potential to Achieve Goals: Fair    Frequency Min 2X/week     Co-evaluation               AM-PAC PT "6 Clicks" Mobility  Outcome Measure Help needed turning from your back to your side while in a flat bed without using bedrails?: A Lot Help needed moving from lying on your back to sitting on the side of a flat bed without using bedrails?: A Lot Help needed moving to and from a bed to a chair (including a wheelchair)?: Total Help needed standing up from a chair using your arms (e.g., wheelchair or bedside chair)?: Total Help needed to walk in hospital room?: Total Help needed climbing 3-5 steps with a railing? : Total 6 Click Score: 8    End of Session   Activity Tolerance: Patient tolerated treatment well Patient left: in bed;with call bell/phone within reach;with bed alarm set;with family/visitor present Nurse Communication: Mobility status;Need for lift equipment PT Visit Diagnosis: Muscle weakness (generalized) (M62.81);Difficulty in walking, not elsewhere classified (R26.2);Pain;Unsteadiness on feet (R26.81) Pain - Right/Left:  (bil) Pain - part of body: Shoulder;Leg    Time: 5102-5852 PT Time Calculation (min) (ACUTE ONLY): 48 min   Charges:   PT Evaluation $PT Eval Moderate Complexity: 1 Mod PT Treatments $Therapeutic Exercise: 8-22 mins $Therapeutic Activity: 8-22 mins        Moishe Spice, PT, DPT Acute Rehabilitation  Services  Pager: [phone] Office: [phone]   Orvan Falconer 01/01/2022, 5:03 PM

## 2022-01-01 NOTE — Progress Notes (Addendum)
Progress Note   Patient: Debbie Arnold NWG:[id number] DOB: 01-30-1927 DOA: 12/29/2021     2 DOS: the patient was seen and examined on 01/01/2022   Brief hospital course: Debbie Arnold was admitted to the hospital with the working diagnosis of acute heart failure decompensation.   88 yo female with the past medical history of heart failure, T2DM, and hypertension who presented with lower extremity edema. Her symptoms were associated with dyspnea, prompting her to come to the hospital. On her initial physical examination her blood pressure was 164/69, HR 74, RR 23 and 02 saturation 93%, her lungs had no wheezing or rhonchi, heart with S1 and S2 present and rhythmic, abdomen soft and positive lower extremity edema ++++.   Na 138, K 4,7, cl 101, bicarbonate 27, glucose 235, bun 34 and cr 1,09 Bnp 50 Wbc 5,1, hgb 11,8, hct 36,2 and plt 201 SARS COVID 19 negative  Urine analysis with SG 1,010 with 21-50 wbc and 21 to 50 rbc.   Chest radiograph with cardiomegaly with right upper lobe atelectasis.   EKG with 82 bpm, left axis, right bundle branch block, sinus rhythm, with no significant ST segment or T wave changes.   Assessment and Plan * Acute on chronic diastolic CHF (congestive heart failure) ([place])- (present on admission) Clinically edema and hypervolemia is improving but not back to baseline.  Urine output is 750 ml over last 24 hrs.  Warm lower extremities and blood pressure systolic 578 to 469 mmHg.   Plan to continue diuresis with furosemide to target further negative fluid balance, increase the dose to  40 mg bid.   Continue blood pressure control with losartan.  Echocardiogram with preserved LV systolic function with EF 55 to 60%, RV preserved systolic function, no significant valvular disease is evident.    HTN (hypertension)- (present on admission) Continue losartan for blood pressure control, continue diuresis with furosemide.  Patient has been on diltazem 180 mg daily.   CKD  (chronic kidney disease) stage 3, GFR 30-59 ml/min (HCC)- (present on admission) Patient clinically continue to have hypervolemia, her renal function today has a serum cr at 1,13 with K at 4,2 and serum bicarbonate at 29. Continue diuresis with furosemide, increase dose to 40 mg bid.   COPD (chronic obstructive pulmonary disease) ([place])- (present on admission) No clinical signs of exacerbation Continue with budesonide and as needed albuterol.   Diabetes mellitus type 2 in obese [facility])- (present on admission) Patient with fasting glucose 153 today, she is tolerating po well, plan to continue glucose cover and monitoring with insulin sliding scale.   Hypothyroidism- (present on admission) Continue with levothyroxine.   Acute lower UTI Continue antibiotic therapy with ceftriaxone for a complete regimen of 3 days.  Patient with no leukocytosis or fever., today with no dysuria or abdominal pain.      Subjective: Patient is feeling better but continue to be very weak and deconditioned, no nausea or vomiting, no dysuria.,   Objective BP (!) 121/43 (BP Location: Left Wrist)    Pulse 82    Temp 98.3 F (36.8 C) (Oral)    Resp (!) 24    Ht 5' 5.5" (1.664 m)    Wt 120.9 kg    SpO2 92%    BMI 43.68 kg/m   Neurology patient is awake and alert ENT mild pallor but no icterus Pulmonary with no wheezing or rhonchi Heart with S1 and S2 present with no gallops or  murmurs Positive lower extremity edema, pitting + bilaterally.  Abdomen is protuberant and non tender Skin with no rashes Musculoskeletal with hypertrophic knees.    Data Reviewed:  As above  Family Communication: I spoke with patient's daughter  at the bedside, we talked in detail about patient's condition, plan of care and prognosis and all questions were addressed.   Disposition: Status is: Inpatient  Remains inpatient appropriate because: IV diuresis          Author: Tawni Millers, MD 01/01/2022 1:39  PM  For on call review [email or web address].

## 2022-01-01 NOTE — TOC Initial Note (Signed)
Transition of Care [facility]) - Initial/Assessment Note    Patient Details  Name: Debbie Arnold MRN: [id number] Date of Birth: 11/22/1927  [facility]) CM/SW Contact:    Tresa Endo Phone Number: 01/01/2022, 2:39 PM  Clinical Narrative:                 CSW received SNF consult. CSW attempted to contact pt son, no answer. CSW will follow up with pt son about SNF options once he calls back. Pt is not oriented x4 and is from [facility]. Pt has used a  hospital wheelchair and is total assist.  CSW reviewed PT/OT recommendations for SNF. Pt has no preference of facility at this time.   CSW will continue to follow.    Expected Discharge Plan: Skilled Nursing Facility Barriers to Discharge: Continued Medical Work up   Patient Goals and CMS Choice Patient states their goals for this hospitalization and ongoing recovery are:: Rehab CMS Medicare.gov Compare Post Acute Care list provided to:: Patient Choice offered to / list presented to : Patient, Adult Children  Expected Discharge Plan and Services Expected Discharge Plan: [place] In-house Referral: Clinical Social Work   Post Acute Care Choice: [place] Living arrangements for the past 2 months: [place]                                      Prior Living Arrangements/Services Living arrangements for the past 2 months: [place] Lives with:: Facility Resident Patient language and need for interpreter reviewed:: Yes Do you feel safe going back to the place where you live?: Yes      Need for Family Participation in Patient Care: Yes (Comment) Care giver support system in place?: Yes (comment)   Criminal Activity/Legal Involvement Pertinent to Current Situation/Hospitalization: No - Comment as needed  Activities of Daily Living Home Assistive Devices/Equipment: CBG Meter, Eyeglasses, Dentures (specify type), Hoyer Lift ADL Screening  (condition at time of admission) Patient's cognitive ability adequate to safely complete daily activities?: No Is the patient deaf or have difficulty hearing?: Yes Does the patient have difficulty seeing, even when wearing glasses/contacts?: Yes Does the patient have difficulty concentrating, remembering, or making decisions?: No Patient able to express need for assistance with ADLs?: Yes Does the patient have difficulty dressing or bathing?: Yes Independently performs ADLs?: No Communication: Independent Dressing (OT): Dependent Is this a change from baseline?: Pre-admission baseline Grooming: Dependent Is this a change from baseline?: Pre-admission baseline Feeding: Needs assistance Is this a change from baseline?: Pre-admission baseline Bathing: Dependent Is this a change from baseline?: Pre-admission baseline Toileting: Dependent Is this a change from baseline?: Pre-admission baseline In/Out Bed: Dependent Is this a change from baseline?: Pre-admission baseline Walks in Home:  (patient does not walk) Does the patient have difficulty walking or climbing stairs?: Yes Weakness of Legs: Both Weakness of Arms/Hands: Both  Permission Sought/Granted Permission sought to share information with : Family Supports, Chartered certified accountant granted to share information with : Yes, Verbal Permission Granted  Share Information with NAME: JANETE, QUILLING (Son)   [phone]  Permission granted to share info w AGENCY: SNF  Permission granted to share info w Relationship: LILLYANNA, GLANDON (Son)   [phone]  Permission granted to share info w Contact Information: ANDRYA, ROPPOLO (Son)   [phone]  Emotional Assessment Appearance:: Appears stated age Attitude/Demeanor/Rapport: Unable to Assess Affect (typically observed):  Unable to Assess Orientation: : Oriented to Self, Oriented to Place, Oriented to  Time Alcohol / Substance Use: Not Applicable Psych Involvement: No  (comment)  Admission diagnosis:  CHF (congestive heart failure) (HCC) [I50.9] Acute pulmonary edema (HCC) [J81.0] Acute on chronic HFrEF (heart failure with reduced ejection fraction) ([place]) [I50.23] Patient Active Problem List   Diagnosis Date Noted   Acute lower UTI 01/01/2022   CKD (chronic kidney disease) stage 3, GFR 30-59 ml/min ([place]) 12/30/2021   Acute on chronic HFrEF (heart failure with reduced ejection fraction) ([place]) 12/30/2021   CHF (congestive heart failure) ([place]) 12/30/2021   Acute kidney injury ([place]) 11/24/2017   Hyponatremia 11/24/2017   Diabetes mellitus type 2 in obese ([place]) 11/23/2017   Hyperlipidemia with target LDL less than 100 11/23/2017   Lumbar facet arthropathy 05/28/2017   Steroid responder, bilateral 01/12/2017   COPD (chronic obstructive pulmonary disease) ([place]) 08/05/2016   Gastroesophageal reflux disease without esophagitis 08/02/2015   Laryngopharyngeal reflux 08/02/2015   Corneal edema 07/03/2015   XT (exotropia) 07/03/2015   Anterior uveitis 06/22/2015   Pain of right eye 05/21/2015   Allergic conjunctivitis of both eyes 03/14/2014   Anxiety, generalized 01/04/2014   Pseudophakia of both eyes 04/27/2013   Obesity, Class III, BMI 40-49.9 (morbid obesity) ([place]) 04/13/2013   Peripheral neuropathy 12/14/2012   Diabetes ([place]) 11/10/2012   Primary open angle glaucoma of both eyes, mild stage 11/10/2012   Spinal stenosis of lumbar region with neurogenic claudication 09/08/2012   Low back pain 07/19/2012   Diabetes mellitus with stage 3 chronic kidney disease ([place]) 11/12/2010   HTN (hypertension) 11/29/2009   Benign neoplasm of skin 05/02/2009   Hypothyroidism 05/02/2009   Constipation 04/16/2009   Dyslipidemia 06/16/2008   Other affections of shoulder region, not elsewhere classified 05/30/2008   Obstructive sleep apnea syndrome 04/07/2008   Anemia, chronic disease 03/22/2008   Alopecia 02/02/2008   DDD (degenerative disc disease), lumbar  01/18/2008   Osteoarthritis of multiple joints 01/18/2008   PCP:  Tempie Hoist, MD Pharmacy:   [id number] [place], [place] [address] [place] [address] Phone: [phone] Fax: [phone]  [id number] [place], [place] - [address] [place] [address] Phone: [phone] Fax: [phone]  [id number] [place], [place] [facility] [place] [address] Phone: [phone] Fax: [phone]     Social Determinants of Health (SDOH) Interventions    Readmission Risk Interventions No flowsheet data found.

## 2022-01-01 NOTE — Evaluation (Signed)
Occupational Therapy Evaluation Patient Details Name: Debbie Arnold MRN: [id number] DOB: 1927-05-09 Today's Date: 01/01/2022   History of Present Illness Pt is a 88 y.o. female who presented 12/29/21 with edema and SOB. Pt admitted with CHF exacerbation. PMH: DM2, HTN, CHF, COPD, thyroid disease   Clinical Impression   PT admitted with CHF exacerbation. Pt currently with functional limitiations due to the deficits listed below (see OT problem list). Pt requires change of linen x2 during session due to incontinence. Pt reports pain in L shoulder that was addressed by RN. The Ten unit battery monitor is no longer attached to patient but the treatment pads are correctly placed. Rn notified of this concern.  Pt will benefit from skilled OT to increase their independence and safety with adls and balance to allow discharge SNF ( if ALF is able to manage at total care level then could return. Family at bedside reports paying for two person (A) extra fees).       Recommendations for follow up therapy are one component of a multi-disciplinary discharge planning process, led by the attending physician.  Recommendations may be updated based on patient status, additional functional criteria and insurance authorization.   Follow Up Recommendations  Skilled nursing-short term rehab (<3 hours/day)    Assistance Recommended at Discharge Frequent or constant Supervision/Assistance  Patient can return home with the following A lot of help with walking and/or transfers;A lot of help with bathing/dressing/bathroom    Functional Status Assessment  Patient has had a recent decline in their functional status and demonstrates the ability to make significant improvements in function in a reasonable and predictable amount of time.  Equipment Recommendations  Other (comment);Hospital bed;Wheelchair cushion (measurements OT);Wheelchair (measurements OT) Product manager)    Recommendations for [employer] Other (comment)  (palliative team)     Precautions / Restrictions Precautions Precautions: Fall Precaution Comments: requires lift for all transfers      Mobility Bed Mobility Overal bed mobility: Needs Assistance Bed Mobility: Rolling Rolling: Total assist, +2 for physical assistance, +2 for safety/equipment         General bed mobility comments: pt requires total (A) for all aspects. pt reports "i can't" when asked to reach with BIL UE. pt tolerates minimal knee flexion of LE to (A) with rolling positioning. pt does not tolerate side lying well and asking to be return to supine    Transfers                   General transfer comment: will require hoyer lift as appropriate      Balance                                           ADL either performed or assessed with clinical judgement   ADL Overall ADL's : Needs assistance/impaired Eating/Feeding: Moderate assistance;Bed level Eating/Feeding Details (indicate cue type and reason): pt reports that prior to admission she fed herself then when asked further gave more of a fatigue answer of "they help me if i need it" pt does not clearly explain how often they help and how much Grooming: Moderate assistance;Bed level   Upper Body Bathing: Maximal assistance   Lower Body Bathing: Total assistance   Upper Body Dressing : Maximal assistance   Lower Body Dressing: Total assistance       Toileting- Clothing Manipulation and Hygiene: Total assistance;Bed level;+2 for  safety/equipment;+2 for physical assistance (+2 assistance for all) Toileting - Clothing Manipulation Details (indicate cue type and reason): pt requires use of pad       General ADL Comments: pt reports baseline being hoyer lift to wheelchair. pt unable to explain toileting routine. pt statse "i tell them" pt reports being in chair majority of the day. pt with skin intact no areas of concern at this time     Vision Baseline Vision/History: 3  Glaucoma Additional Comments: noted to have strabimus     Perception     Praxis      Pertinent Vitals/Pain Pain Assessment Pain Assessment: Faces Faces Pain Scale: Hurts even more Pain Location: shoulders bil Pain Descriptors / Indicators: Constant Pain Intervention(s): Monitored during session, Premedicated before session, Repositioned, RN gave pain meds during session     Hand Dominance Right   Extremity/Trunk Assessment Upper Extremity Assessment Upper Extremity Assessment: RUE deficits/detail;LUE deficits/detail RUE Deficits / Details: AROM 40 degrees with flexed elbow positioning and requires cues to attempt elbow extension without full extension RUE Coordination: decreased fine motor;decreased gross motor LUE Deficits / Details: AROM 30 degrees very guarded of L Ue. pt has ten unit on this side and topical cream for pain management. pt tolerates AAROM 40 degrees and able to complete full elbow flexion /extension. pt reports no pain at elbow but then with any attempts to get her to move it reports pain in L UE. pt very guarded with all movement of extremity or positioning LUE Coordination: decreased fine motor;decreased gross motor   Lower Extremity Assessment Lower Extremity Assessment: RLE deficits/detail;LLE deficits/detail;Defer to PT evaluation RLE Deficits / Details: edema present , skin intact no pressure noted LLE Deficits / Details: pt reports wound on big toe but none noted. pt does have scab wound on 2nd toe and when informed this pt states "no its the big one" OT pointing to the undressed toe and pt continues to deny that this is the toe with injury   Cervical / Trunk Assessment Cervical / Trunk Assessment: Kyphotic   Communication Communication Communication: HOH   Cognition Arousal/Alertness: Awake/alert Behavior During Therapy: Anxious Overall Cognitive Status: Impaired/Different from baseline Area of Impairment: Awareness, Problem solving                            Awareness: Emergent Problem Solving: Slow processing General Comments: pt making request prior to first request being complete. pt reports that she tells facility about incontinence and today was unaware of stool. pt then reports "it just happens i can't control it" Pt requesting to remain supine in a particular position without awareness to skin needs.     General Comments  high risk for skin break down due to incontinence and inability to reposition.    Exercises Exercises: Other exercises Other Exercises Other Exercises: attempts for AAROM bil UE shoulder flexoin, elbow flexion/ extension and digits flexion/ extension   Shoulder Instructions      Home Living Family/patient expects to be discharged to:: Skilled nursing facility                                        Prior Functioning/Environment Prior Level of Function : Needs assist               ADLs Comments: hoyer lift to bath chair friday and mondays at facility. family  paying for two caregivers as pt does not like to be moved with 1 person (A) due to pain.        OT Problem List: Decreased strength;Decreased range of motion;Decreased activity tolerance;Impaired balance (sitting and/or standing);Decreased safety awareness;Decreased knowledge of use of DME or AE;Decreased knowledge of precautions;Cardiopulmonary status limiting activity;Obesity;Impaired UE functional use;Pain      OT Treatment/Interventions: Self-care/ADL training;Therapeutic exercise;Energy conservation;DME and/or AE instruction;Manual therapy;Modalities;Therapeutic activities;Cognitive remediation/compensation;Patient/family education;Balance training    OT Goals(Current goals can be found in the care plan section) Acute Rehab OT Goals Patient Stated Goal: to get warmer. provided a heated blanket by CNA at end of session OT Goal Formulation: With patient Time For Goal Achievement: 01/15/22 Potential to Achieve  Goals: Good  OT Frequency: Min 1X/week    Co-evaluation              AM-PAC OT "6 Clicks" Daily Activity     Outcome Measure Help from another person eating meals?: A Lot Help from another person taking care of personal grooming?: A Lot Help from another person toileting, which includes using toliet, bedpan, or urinal?: A Lot Help from another person bathing (including washing, rinsing, drying)?: A Lot Help from another person to put on and taking off regular upper body clothing?: A Lot Help from another person to put on and taking off regular lower body clothing?: Total 6 Click Score: 11   End of Session Nurse Communication: Mobility status;Precautions;Need for lift equipment  Activity Tolerance: Patient tolerated treatment well Patient left: in bed;with call bell/phone within reach;with bed alarm set;with family/visitor present  OT Visit Diagnosis: Unsteadiness on feet (R26.81);Muscle weakness (generalized) (M62.81);Pain Pain - Right/Left: Left Pain - part of body: Shoulder                Time: 2637-8588 OT Time Calculation (min): 45 min Charges:  OT General Charges $OT Visit: 1 Visit OT Evaluation $OT Eval Moderate Complexity: 1 Mod OT Treatments $Self Care/Home Management : 23-37 mins   Brynn, OTR/L  Acute Rehabilitation Services Pager: [phone] Office: [phone] .   Jeri Modena 01/01/2022, 11:07 AM

## 2022-01-01 NOTE — Hospital Course (Addendum)
Debbie Arnold was admitted to the hospital with the working diagnosis of acute heart failure decompensation.   88 yo female with the past medical history of heart failure, T2DM, and hypertension who presented with lower extremity edema. Her symptoms were associated with dyspnea, prompting her to come to the hospital. On her initial physical examination her blood pressure was 164/69, HR 74, RR 23 and 02 saturation 93%, her lungs had no wheezing or rhonchi, heart with S1 and S2 present and rhythmic, abdomen soft and positive lower extremity edema ++++.   Na 138, K 4,7, cl 101, bicarbonate 27, glucose 235, bun 34 and cr 1,09 Bnp 50 Wbc 5,1, hgb 11,8, hct 36,2 and plt 201 SARS COVID 19 negative  Urine analysis with SG 1,010 with 21-50 wbc and 21 to 50 rbc.   Chest radiograph with cardiomegaly with right upper lobe atelectasis.   EKG with 82 bpm, left axis, right bundle branch block, sinus rhythm, with no significant ST segment or T wave changes.   Patient was placed on furosemide with good toleration and improvement in volume status.  Continue to be very weak and deconditioned, PT and OT have recommended SNF but patient would like to go to CIR or home with home health.  Improved volume status but developed AKI that clinically has improved. Heart failure now compensated, but patient very weak and deconditioned. Her family would like patient to go to CIR but insurance has denied.  Family is planning to appeal.

## 2022-01-01 NOTE — Progress Notes (Signed)
Heart Failure Navigator Progress Note  Assessed for Heart & Vascular TOC clinic readiness. Pt has significant incontinence and prefers not to add any "extra appointment visits". Understandable. Encouraged follow up with PCP. Pt declined HV TOC clinic appt upon DC from hospitalization. Pt educated on benefits of HV TOC. Pt education complete regarding HF patient booklet.     Navigator available for educational resources.  Pricilla Holm, MSN, RN Heart Failure Nurse Navigator [phone]

## 2022-01-01 NOTE — Assessment & Plan Note (Addendum)
Resolved, completed antibiotic therapy

## 2022-01-01 NOTE — NC FL2 (Signed)
[place] LEVEL OF CARE SCREENING TOOL     IDENTIFICATION  Patient Name: Debbie Arnold Birthdate: October 27, 1927 Sex: female Admission Date (Current Location): 12/29/2021  [facility] and [place] Number:  Herbalist and Address:  [facility] [place], [place] [address], [place], [place] [address]      Provider Number: [id number]  Attending Physician Name and Address:  Tawni Millers,*  Relative Name and Phone Number:  RAYVN, RICKERSON)   [phone]    Current Level of Care: Hospital Recommended Level of Care: [place] Prior Approval Number:    Date Approved/Denied:   PASRR Number: [id number]  Discharge Plan: SNF    Current Diagnoses: Patient Active Problem List   Diagnosis Date Noted   CKD (chronic kidney disease) stage 3, GFR 30-59 ml/min ([place]) 12/30/2021   Acute on chronic HFrEF (heart failure with reduced ejection fraction) ([place]) 12/30/2021   CHF (congestive heart failure) ([place]) 12/30/2021   Acute kidney injury ([place]) 11/24/2017   Hyponatremia 11/24/2017   Diabetes mellitus type 2 in obese ([place]) 11/23/2017   Hyperlipidemia with target LDL less than 100 11/23/2017   Lumbar facet arthropathy 05/28/2017   Steroid responder, bilateral 01/12/2017   COPD (chronic obstructive pulmonary disease) ([place]) 08/05/2016   Gastroesophageal reflux disease without esophagitis 08/02/2015   Laryngopharyngeal reflux 08/02/2015   Corneal edema 07/03/2015   XT (exotropia) 07/03/2015   Anterior uveitis 06/22/2015   Pain of right eye 05/21/2015   Allergic conjunctivitis of both eyes 03/14/2014   Anxiety, generalized 01/04/2014   Pseudophakia of both eyes 04/27/2013   Obesity, Class III, BMI 40-49.9 (morbid obesity) ([place]) 04/13/2013   Peripheral neuropathy 12/14/2012   Diabetes ([place]) 11/10/2012   Primary open angle glaucoma of both eyes, mild stage 11/10/2012   Spinal stenosis of lumbar region with neurogenic claudication 09/08/2012    Low back pain 07/19/2012   Diabetes mellitus with stage 3 chronic kidney disease ([place]) 11/12/2010   HTN (hypertension) 11/29/2009   Benign neoplasm of skin 05/02/2009   Hypothyroidism 05/02/2009   Constipation 04/16/2009   Dyslipidemia 06/16/2008   Other affections of shoulder region, not elsewhere classified 05/30/2008   Obstructive sleep apnea syndrome 04/07/2008   Anemia, chronic disease 03/22/2008   Alopecia 02/02/2008   DDD (degenerative disc disease), lumbar 01/18/2008   Osteoarthritis of multiple joints 01/18/2008    Orientation RESPIRATION BLADDER Height & Weight     Self, Time, Place  Normal Incontinent, External catheter Weight: 266 lb 8.6 oz (120.9 kg) Height:  5' 5.5" (166.4 cm)  BEHAVIORAL SYMPTOMS/MOOD NEUROLOGICAL BOWEL NUTRITION STATUS      Incontinent Diet (See DC Summary)  AMBULATORY STATUS COMMUNICATION OF NEEDS Skin   Extensive Assist Verbally Normal                       Personal Care Assistance Level of Assistance  Bathing, Feeding, Dressing Bathing Assistance: Maximum assistance Feeding assistance: Limited assistance Dressing Assistance: Maximum assistance     Functional Limitations Info  Sight, Hearing, Speech Sight Info: Impaired Hearing Info: Impaired Speech Info: Adequate    SPECIAL CARE FACTORS FREQUENCY  PT (By licensed PT), OT (By licensed OT)     PT Frequency: 5x a week OT Frequency: 5x a week            Contractures Contractures Info: Not present    Additional Factors Info  Code Status, Allergies Code Status Info: Full Allergies Info: Brimonidine   Celecoxib   Red  Dye   Codeine           Current Medications (01/01/2022):  This is the current hospital active medication list Current Facility-Administered Medications  Medication Dose Route Frequency Provider Last Rate Last Admin   0.9 %  sodium chloride infusion  250 mL Intravenous PRN Etta Quill, DO       acetaminophen (TYLENOL) tablet 650 mg  650 mg Oral Q4H  PRN Etta Quill, DO   650 mg at 12/31/21 2249   albuterol (PROVENTIL) (2.5 MG/3ML) 0.083% nebulizer solution 2.5 mg  2.5 mg Nebulization Q4H PRN Pokhrel, Laxman, MD       aspirin chewable tablet 81 mg  81 mg Oral Daily Pokhrel, Laxman, MD   81 mg at 01/01/22 0846   budesonide (PULMICORT) nebulizer solution 0.5 mg  0.5 mg Nebulization BID Pokhrel, Laxman, MD   0.5 mg at 01/01/22 4098   cefTRIAXone (ROCEPHIN) 1 g in sodium chloride 0.9 % 100 mL IVPB  1 g Intravenous Q24H Pokhrel, Laxman, MD 200 mL/hr at 01/01/22 0901 1 g at 01/01/22 0901   cyclobenzaprine (FLEXERIL) tablet 5 mg  5 mg Oral TID Pokhrel, Laxman, MD   5 mg at 01/01/22 0846   diclofenac Sodium (VOLTAREN) 1 % topical gel 2-4 g  2-4 g Topical BID Pokhrel, Laxman, MD   4 g at 01/01/22 0902   diltiazem (CARDIZEM CD) 24 hr capsule 180 mg  180 mg Oral Daily Pokhrel, Laxman, MD   180 mg at 01/01/22 0846   docusate sodium (COLACE) capsule 100 mg  100 mg Oral BID Pokhrel, Laxman, MD   100 mg at 01/01/22 0846   dorzolamide (TRUSOPT) 2 % ophthalmic solution 1 drop  1 drop Both Eyes BID Pokhrel, Laxman, MD   1 drop at 01/01/22 0852   doxazosin (CARDURA) tablet 1 mg  1 mg Oral Daily Pokhrel, Laxman, MD   1 mg at 01/01/22 1031   enoxaparin (LOVENOX) injection 40 mg  40 mg Subcutaneous Q24H Alcario Drought, Jared M, DO   40 mg at 01/01/22 0853   escitalopram (LEXAPRO) tablet 10 mg  10 mg Oral Daily Pokhrel, Laxman, MD   10 mg at 01/01/22 0846   fluticasone (FLONASE) 50 MCG/ACT nasal spray 2 spray  2 spray Each Nare BID Pokhrel, Laxman, MD   2 spray at 11/91/47 8295   folic acid (FOLVITE) tablet 5 mg  5 mg Oral Daily Pokhrel, Laxman, MD   5 mg at 01/01/22 0846   furosemide (LASIX) injection 40 mg  40 mg Intravenous Daily Jennette Kettle M, DO   40 mg at 01/01/22 0854   gabapentin (NEURONTIN) capsule 300 mg  300 mg Oral BID Pokhrel, Laxman, MD   300 mg at 01/01/22 0845   insulin aspart (novoLOG) injection 0-5 Units  0-5 Units Subcutaneous QHS Jennette Kettle M,  DO       insulin aspart (novoLOG) injection 0-9 Units  0-9 Units Subcutaneous TID WC Etta Quill, DO   2 Units at 01/01/22 1208   levothyroxine (SYNTHROID) tablet 125 mcg  125 mcg Oral QAC breakfast Pokhrel, Laxman, MD   125 mcg at 01/01/22 6213   losartan (COZAAR) tablet 100 mg  100 mg Oral Daily Pokhrel, Laxman, MD   100 mg at 01/01/22 0846   ondansetron (ZOFRAN) injection 4 mg  4 mg Intravenous Q6H PRN Etta Quill, DO       pantoprazole (PROTONIX) EC tablet 40 mg  40 mg Oral Daily Pokhrel, Laxman, MD  40 mg at 01/01/22 0846   senna (SENOKOT) tablet 8.6 mg  1 tablet Oral QHS Pokhrel, Laxman, MD   8.6 mg at 12/31/21 2142   sodium chloride flush (NS) 0.9 % injection 3 mL  3 mL Intravenous Q12H Jennette Kettle M, DO   3 mL at 01/01/22 1019   sodium chloride flush (NS) 0.9 % injection 3 mL  3 mL Intravenous PRN Etta Quill, DO       umeclidinium-vilanterol (ANORO ELLIPTA) 62.5-25 MCG/ACT 1 puff  1 puff Inhalation Daily Pokhrel, Laxman, MD   1 puff at 12/31/21 2518     Discharge Medications: Please see discharge summary for a list of discharge medications.  Relevant Imaging Results:  Relevant Lab Results:   Additional Information SSN: [id number]; [place] COVID-19 Vaccine 09/06/2020 , 01/06/2020 , 12/16/2019  Reece Agar, LCSWA

## 2022-01-02 DIAGNOSIS — I5033 Acute on chronic diastolic (congestive) heart failure: Secondary | ICD-10-CM

## 2022-01-02 LAB — BASIC METABOLIC PANEL
Anion gap: 10 (ref 5–15)
BUN: 52 mg/dL — ABNORMAL HIGH (ref 8–23)
CO2: 30 mmol/L (ref 22–32)
Calcium: 9.2 mg/dL (ref 8.9–10.3)
Chloride: 99 mmol/L (ref 98–111)
Creatinine, Ser: 1.26 mg/dL — ABNORMAL HIGH (ref 0.44–1.00)
GFR, Estimated: 40 mL/min — ABNORMAL LOW (ref >60)
Glucose, Bld: 153 mg/dL — ABNORMAL HIGH (ref 70–99)
Potassium: 4.1 mmol/L (ref 3.5–5.1)
Sodium: 139 mmol/L (ref 135–145)

## 2022-01-02 LAB — GLUCOSE, CAPILLARY
Glucose-Capillary: 161 mg/dL — ABNORMAL HIGH (ref 70–99)
Glucose-Capillary: 198 mg/dL — ABNORMAL HIGH (ref 70–99)
Glucose-Capillary: 115 mg/dL — ABNORMAL HIGH (ref 70–99)

## 2022-01-02 LAB — T3, FREE: T3, Free: 1.7 pg/mL — ABNORMAL LOW (ref 2.0–4.4)

## 2022-01-02 MED ORDER — EMPAGLIFLOZIN 10 MG PO TABS
10.0000 mg | ORAL_TABLET | Freq: Every day | ORAL | Status: DC
  Administered 2022-01-03 – 2022-01-08 (×6): 10 mg via ORAL
  Filled 2022-01-02 (×6): qty 1

## 2022-01-02 MED ORDER — CYCLOBENZAPRINE HCL 5 MG PO TABS
5.0000 mg | ORAL_TABLET | Freq: Three times a day (TID) | ORAL | Status: DC | PRN
  Administered 2022-01-03 (×2): 5 mg via ORAL
  Filled 2022-01-02 (×5): qty 1

## 2022-01-02 MED ORDER — METOPROLOL SUCCINATE ER 50 MG PO TB24
50.0000 mg | ORAL_TABLET | Freq: Every day | ORAL | Status: DC
  Administered 2022-01-03 – 2022-01-08 (×6): 50 mg via ORAL
  Filled 2022-01-02 (×6): qty 1

## 2022-01-02 NOTE — Progress Notes (Signed)
Occupational Therapy Treatment Patient Details Name: Debbie Arnold MRN: [id number] DOB: [birth year] Today's Date: 01/02/2022   History of present illness Pt is a 88 y.o. female who presented 12/29/21 with edema and SOB. Pt admitted with CHF exacerbation. PMH: DM2, HTN, CHF, COPD, thyroid disease   OT comments  Patient with incremental gains toward patient focused goals.  Bulk of the session surrounded facilitating hip rotation supine prior to rolling, rolling side to side with max A and attempted use of SR's, and supine to sit.  Patient needing max A for all bed mobility, and assist to maintain upright sitting.  Patient with difficulty reaching outside of her base of support, and needing cues to lean forward.  AIR placed a note that patient was not accepted, but OT/PT had been recommending SNF due to extensive assist.  OT can follow in the acute setting at 1x/wk as set on eval.     Recommendations for follow up therapy are one component of a multi-disciplinary discharge planning process, led by the attending physician.  Recommendations may be updated based on patient status, additional functional criteria and insurance authorization.    Follow Up Recommendations  Skilled nursing-short term rehab (<3 hours/day)    Assistance Recommended at Discharge Frequent or constant Supervision/Assistance  Patient can return home with the following  A lot of help with walking and/or transfers;A lot of help with bathing/dressing/bathroom   Equipment Recommendations  Other (comment);Hospital bed;Wheelchair cushion (measurements OT);Wheelchair (measurements OT)    Recommendations for Other Services      Precautions / Restrictions Precautions Precautions: Fall Restrictions Weight Bearing Restrictions: No       Mobility Bed Mobility Overal bed mobility: Needs Assistance Bed Mobility: Sidelying to Sit   Sidelying to sit: Max assist, HOB elevated   Sit to supine: Total assist        Transfers                          Balance   Sitting-balance support: Bilateral upper extremity supported, Feet supported Sitting balance-Leahy Scale: Poor   Postural control: Posterior lean                                 ADL either performed or assessed with clinical judgement   ADL       Grooming: Moderate assistance;Bed level               Lower Body Dressing: Total assistance;Bed level                        Cognition Arousal/Alertness: Awake/alert Behavior During Therapy: WFL for tasks assessed/performed                         Memory: Decreased short-term memory       Problem Solving: Slow processing, Difficulty sequencing, Requires verbal cues, Requires tactile cues                             Pertinent Vitals/ Pain       Pain Assessment Pain Assessment: Faces Faces Pain Scale: Hurts little more Pain Location: shoulders and lower legs Pain Descriptors / Indicators: Grimacing, Guarding, Discomfort Pain Intervention(s): Monitored during session  Frequency  Min 1X/week        Progress Toward Goals  OT Goals(current goals can now be found in the care plan section)     Acute Rehab OT Goals OT Goal Formulation: With patient Time For Goal Achievement: 01/15/22 Potential to Achieve Goals: Good  Plan Discharge plan remains appropriate    Co-evaluation                 AM-PAC OT "6 Clicks" Daily Activity     Outcome Measure   Help from another person eating meals?: A Lot Help from another person taking care of personal grooming?: A Lot Help from another person toileting, which includes using toliet, bedpan, or urinal?: A Lot Help from another person bathing (including washing, rinsing, drying)?: A Lot Help from another person to put on and taking off regular upper body clothing?: A Lot Help from another person to  put on and taking off regular lower body clothing?: Total 6 Click Score: 11    End of Session    OT Visit Diagnosis: Unsteadiness on feet (R26.81);Muscle weakness (generalized) (M62.81);Pain Pain - Right/Left: Left Pain - part of body: Shoulder   Activity Tolerance Patient limited by fatigue   Patient Left in bed;with call bell/phone within reach;with bed alarm set   Nurse Communication Mobility status;Precautions;Need for lift equipment        Time: 8984-2103 OT Time Calculation (min): 15 min  Charges: OT General Charges $OT Visit: 1 Visit OT Treatments $Therapeutic Activity: 8-22 mins  01/02/2022  RP, OTR/L  Acute Rehabilitation Services  Office:  [phone]   Debbie Arnold 01/02/2022, 5:15 PM

## 2022-01-02 NOTE — Progress Notes (Signed)
Progress Note   Patient: Debbie Arnold [id number]:[id number] DOB: October 24, 1927 DOA: 12/29/2021     3 DOS: the patient was seen and examined on 01/02/2022   Brief hospital course: Debbie Arnold was admitted to the hospital with the working diagnosis of acute heart failure decompensation.   88 yo female with the past medical history of heart failure, T2DM, and hypertension who presented with lower extremity edema. Her symptoms were associated with dyspnea, prompting her to come to the hospital. On her initial physical examination her blood pressure was 164/69, HR 74, RR 23 and 02 saturation 93%, her lungs had no wheezing or rhonchi, heart with S1 and S2 present and rhythmic, abdomen soft and positive lower extremity edema ++++.   Na 138, K 4,7, cl 101, bicarbonate 27, glucose 235, bun 34 and cr 1,09 Bnp 50 Wbc 5,1, hgb 11,8, hct 36,2 and plt 201 SARS COVID 19 negative  Urine analysis with SG 1,010 with 21-50 wbc and 21 to 50 rbc.   Chest radiograph with cardiomegaly with right upper lobe atelectasis.   EKG with 82 bpm, left axis, right bundle branch block, sinus rhythm, with no significant ST segment or T wave changes.   Patient was placed on furosemide with good toleration and improvement in volume status.  Continue to be very weak and deconditioned, PT and OT have recommended SNF but patient would like to go to CIR or home with home health   Assessment and Plan * Acute on chronic diastolic CHF (congestive heart failure) ([place])- (present on admission) Her volume has improved, edema at her lower extremities has improved. Urinary output over last 24 hrs is 1,750    Echocardiogram with preserved LV systolic function with EF 55 to 60%, RV preserved systolic function, no significant valvular disease is evident.   Plan to transition to oral furosemide in am to keep negative fluid balance. Continue blood pressure control with losartan, will change diltiazem to metoprolol.  Add empagliflozin   HTN  (hypertension)- (present on admission) Blood pressure control with losartan, will change diltiazem to metoprolol in the setting of heart failure.  Systolic blood pressure has been 121 mmHg range  Transition to oral furosemide in am.   CKD (chronic kidney disease) stage 3, GFR 30-59 ml/min (HCC)- (present on admission) Improved volume status, renal function today with serum cr at 1,26 with k at 4,1 and serum bicarbonate at 30  Plan to transition to oral furosemide in am. Follow up renal function in am, avoid hypotension and nephrotoxic medications.   COPD (chronic obstructive pulmonary disease) ([place])- (present on admission) No exacerbation Continue with budesonide and as needed albuterol.   Diabetes mellitus type 2 in obese ([place])- (present on admission) Her glucose has remained stable capillary glucose 198 and 161, patient is tolerating po well.   Hypothyroidism- (present on admission) On levothyroxine.   Acute lower UTI Patient now has completed antibiotic therapy with good toleration   Depression Continue with escitalopram. Tolerated well trazodone with improved sleep last  Night, no confusion or agitation.   Class 3 obesity (HCC) Calculated BMI is 43,7 with limited mobility,  PT and OT have recommended SNF,  CIR evaluation concluded patient not candidate for CIR.  Follow with repeat PT and OT evaluation, her son is hopeful patient can go to CIR, despite initial evaluation      Subjective: patient is feeling better, but continue to be very weak and deconditioned.   Objective BP (!) 121/56    Pulse 87  Temp 98.6 F (37 C) (Oral)    Resp (!) 21    Ht 5' 5.5" (1.664 m)    Wt 121 kg    SpO2 95%    BMI 43.72 kg/m   Neurology awake and alert ENT no pallor  Cardiovascular heart with S1 and S2 present and rhythmic, no gallops or murmurs  Pulmonary  lungs with no wheezing or rhonchi, on anterior auscultation  Abdominal protuberant with no distention or tenderness  Skin no  rashes  Musculoskeletal hypertrophic knees. Sp bilateral knee surgery more than 10 years ago    Data Reviewed:    Family Communication: I spoke with patient's daughter (son over the phone) at the bedside, we talked in detail about patient's condition, plan of care and prognosis and all questions were addressed.   Disposition: Status is: Inpatient  Remains inpatient appropriate because: diuresis            Author: Tawni Millers, MD 01/02/2022 3:31 PM  For on call review [email or web address].

## 2022-01-02 NOTE — TOC Progression Note (Signed)
Transition of Care [facility]) - Progression Note    Patient Details  Name: Debbie Arnold MRN: [id number] Date of Birth: [birth year]  Transition of Care [facility]) CM/SW [place], [place] Phone Number: 01/02/2022, 10:24 AM  Clinical Narrative:    CSW spoke with pt in room with her daughter to discuss pt DC plan. Pt daughter called her sister on speaker phone to join the conversation. CSW explained PT recommendation for SNF and how the process works. Pt daughters stated that the do not want to send their mother to a SNF bc [facility] and they want her to continue [facility] services at [facility]. Pt daughter also asked if pt DC on Tuesday or Thursday to contact [facility] bc they will transport her. Pt asked CSW to contact her son to follow up on the DC plan bc he makes her medical decisions. CSW contacted pt son who stated he does not want pt to go directly back to [facility] bc pt went over a month without PT and the family was under the impression she was getting [facility] and OT. PT son thinks this is why the pt is weak. Pt son stated he would prefer CIR bc the pt has been to the [facility] and it was really good for her. CSW requested that CIR review pt for bed offer. Pt did not meet the qualifications for CIR, CSW sent pt referral to [place] with Janett Billow, she will follow up with CSW after reviewing pt chart and visiting pt in room.  CSW attempted to contact pt son to provide follow up, no answer. CSW left a VM and will continue to follow for DC planning needs.   Expected Discharge Plan: [place] Barriers to Discharge: Continued Medical Work up  Expected Discharge Plan and Services Expected Discharge Plan: [place] In-house Referral: Clinical Social Work   Post Acute Care Choice: [place] Living arrangements for the past 2 months: [place]                                       Social  Determinants of Health (SDOH) Interventions    Readmission Risk Interventions No flowsheet data found.

## 2022-01-02 NOTE — Assessment & Plan Note (Addendum)
Continue with escitalopram. Somnolence has improved, continue to hold on trazodone

## 2022-01-02 NOTE — Progress Notes (Signed)
Inpatient Rehabilitation Admissions Coordinator   Received rehab consult. Patient not at a level to tolerate the intensity required of an inpatient rehab admit/CIR. Also [place] [facility] will not approve for this diagnosis. We will not pursue CIR. I will alert acute team and TOC.  Danne Baxter, RN, MSN Rehab Admissions Coordinator [phone] 01/02/2022 1:28 PM

## 2022-01-02 NOTE — Care Management Important Message (Signed)
Important Message  Patient Details  Name: Debbie Arnold MRN: [id number] Date of Birth: 1926-12-17   Medicare Important Message Given:  Yes     Shelda Altes 01/02/2022, 8:38 AM

## 2022-01-02 NOTE — Assessment & Plan Note (Addendum)
BMI is 43,7 with limited mobility,  Pending transfer to rehab.

## 2022-01-02 NOTE — Progress Notes (Signed)
Pt o2 sat found to be 85% on RA. Pt placed on 2L [place]. O2 sat now 97%.

## 2022-01-03 ENCOUNTER — Other Ambulatory Visit (HOSPITAL_COMMUNITY): Payer: Self-pay

## 2022-01-03 DIAGNOSIS — N179 Acute kidney failure, unspecified: Secondary | ICD-10-CM

## 2022-01-03 LAB — BASIC METABOLIC PANEL
Anion gap: 8 (ref 5–15)
BUN: 58 mg/dL — ABNORMAL HIGH (ref 8–23)
CO2: 29 mmol/L (ref 22–32)
Calcium: 8.9 mg/dL (ref 8.9–10.3)
Chloride: 100 mmol/L (ref 98–111)
Creatinine, Ser: 1.37 mg/dL — ABNORMAL HIGH (ref 0.44–1.00)
GFR, Estimated: 36 mL/min — ABNORMAL LOW (ref >60)
Glucose, Bld: 146 mg/dL — ABNORMAL HIGH (ref 70–99)
Potassium: 4.1 mmol/L (ref 3.5–5.1)
Sodium: 137 mmol/L (ref 135–145)

## 2022-01-03 LAB — GLUCOSE, CAPILLARY
Glucose-Capillary: 167 mg/dL — ABNORMAL HIGH (ref 70–99)
Glucose-Capillary: 162 mg/dL — ABNORMAL HIGH (ref 70–99)
Glucose-Capillary: 142 mg/dL — ABNORMAL HIGH (ref 70–99)
Glucose-Capillary: 162 mg/dL — ABNORMAL HIGH (ref 70–99)

## 2022-01-03 NOTE — TOC Benefit Eligibility Note (Signed)
Patient Teacher, English as a foreign language completed.    The patient is currently admitted and upon discharge could be taking Jardiance 10 mg.  The current 30 day co-pay is, $47.00.   The patient is insured through [employer], [place] Patient Advocate Specialist [place] Patient Advocate Team Direct Number: [phone]  Fax: [phone]

## 2022-01-03 NOTE — Plan of Care (Signed)
°  Problem: Education: Goal: Ability to demonstrate management of disease process will improve Outcome: Progressing   Problem: Cardiac: Goal: Ability to achieve and maintain adequate cardiopulmonary perfusion will improve Outcome: Progressing   Problem: Education: Goal: Knowledge of General Education information will improve Description: Including pain rating scale, medication(s)/side effects and non-pharmacologic comfort measures Outcome: Progressing   Problem: Clinical Measurements: Goal: Ability to maintain clinical measurements within normal limits will improve Outcome: Progressing Goal: Will remain free from infection Outcome: Progressing Goal: Diagnostic test results will improve Outcome: Progressing Goal: Respiratory complications will improve Outcome: Progressing Goal: Cardiovascular complication will be avoided Outcome: Progressing   Problem: Clinical Measurements: Goal: Respiratory complications will improve Outcome: Progressing   Problem: Clinical Measurements: Goal: Cardiovascular complication will be avoided Outcome: Progressing   Problem: Safety: Goal: Ability to remain free from injury will improve Outcome: Progressing   Problem: Pain Managment: Goal: General experience of comfort will improve Outcome: Progressing   Problem: Skin Integrity: Goal: Risk for impaired skin integrity will decrease Outcome: Progressing

## 2022-01-03 NOTE — TOC Progression Note (Addendum)
Transition of Care [facility]) - Progression Note    Patient Details  Name: TAMEAKA EICHHORN MRN: [id number] Date of Birth: Feb 06, 1927  Transition of Care [facility]) CM/SW Contact  Reece Agar, [place] Phone Number: 01/03/2022, 3:46 PM  Clinical Narrative:    CSW spoke with Janett Billow at [facility] is not able to accept pt due to the excessive therapy. CSW followed up with son to inform him that both CIR therapies are not able to accept pt. Pt son is still not wanting his mother to DC to [facility] bc he is concerned that pt will not get therapy and will get worse physically. PT son asked to view [facility] and [facility]F as possible options for placement.  CSW contacted pt son to follow up on facility placement, he stated he is still visiting facilities. CSW contacted [facility] for bed availability, they will have a bed Monday. CSW contacted [facility] they will also have a bed Monday, CSW faxed clinicals to [facility] at [phone].  CSW started pt auth with facility pending.    Expected Discharge Plan: [place] Barriers to Discharge: Continued Medical Work up  Expected Discharge Plan and Services Expected Discharge Plan: [place] In-house Referral: Clinical Social Work   Post Acute Care Choice: [place] Living arrangements for the past 2 months: [place]                                       Social Determinants of Health (SDOH) Interventions    Readmission Risk Interventions No flowsheet data found.

## 2022-01-03 NOTE — Progress Notes (Signed)
Progress Note   Patient: Debbie Arnold [id number] DOB: 09/19/1927 DOA: 12/29/2021     4 DOS: the patient was seen and examined on 01/03/2022   Brief hospital course: Debbie Arnold was admitted to the hospital with the working diagnosis of acute heart failure decompensation.   88 yo female with the past medical history of heart failure, T2DM, and hypertension who presented with lower extremity edema. Her symptoms were associated with dyspnea, prompting her to come to the hospital. On her initial physical examination her blood pressure was 164/69, HR 74, RR 23 and 02 saturation 93%, her lungs had no wheezing or rhonchi, heart with S1 and S2 present and rhythmic, abdomen soft and positive lower extremity edema ++++.   Na 138, K 4,7, cl 101, bicarbonate 27, glucose 235, bun 34 and cr 1,09 Bnp 50 Wbc 5,1, hgb 11,8, hct 36,2 and plt 201 SARS COVID 19 negative  Urine analysis with SG 1,010 with 21-50 wbc and 21 to 50 rbc.   Chest radiograph with cardiomegaly with right upper lobe atelectasis.   EKG with 82 bpm, left axis, right bundle branch block, sinus rhythm, with no significant ST segment or T wave changes.   Patient was placed on furosemide with good toleration and improvement in volume status.  Continue to be very weak and deconditioned, PT and OT have recommended SNF but patient would like to go to CIR or home with home health.  Improved volume status but developed AKI.  Pending improvement in renal function before her discharge.   Assessment and Plan * Acute on chronic diastolic CHF (congestive heart failure) ([place])- (present on admission) Echocardiogram with preserved LV systolic function with EF 55 to 60%, RV preserved systolic function, no significant valvular disease is evident.   Improved volume status, today holding on furosemide due to worsening renal function.  Heart failure management with metoprolol, losartan and empagliflozin  Her volume status has improved, but she  continue to be very weak and deconditioned   HTN (hypertension)- (present on admission) This am blood pressure better down to 027 mmHg systolic HR in the 80 at rest.   Plan to continue with losartan and metoprolol Continue close monitoring HR  AKI (acute kidney injury) ([place])- (present on admission) CKD stage 3a.  Renal function today with serum cr at 1,37 with K at 4,1 and serum bicarbonate at 29 Urine output documented at 1,650 ml Her edema continue to improve.   Plan to continue holding diuresis for now and follow renal function in am. Avoid hypotension and nephrotoxic medications.    COPD (chronic obstructive pulmonary disease) ([place])- (present on admission) No signs of acute exacerbation Continue with budesonide and as needed albuterol.   Diabetes mellitus type 2 in obese ([place])- (present on admission) Fasting glucose this am is 146,  Plant to continue with insulin sliding scale for glucose cover and monitoring.  Patient is tolerating po well.    Hypothyroidism- (present on admission) Continue wiht levothyroxine.   Acute lower UTI Resolved, completed antibiotic therapy   Depression On escitalopram. Continue with trazodone with good toleration   Class 3 obesity (HCC) Calculated BMI is 43,7 with limited mobility,  PT and OT have recommended SNF,  CIR evaluation concluded patient not candidate for CIR.  Follow with repeat PT and OT evaluation, her son is hopeful patient can go to CIR, despite initial evaluation      Subjective: Patient is feeling better, but continue to be very weak, no nausea or vomiting, edema and dyspnea  continue to improve.   Objective BP (!) 118/51 (BP Location: Left Wrist)    Pulse 85    Temp 98.4 F (36.9 C) (Oral)    Resp (!) 23    Ht 5' 5.5" (1.664 m)    Wt 122 kg    SpO2 93%    BMI 44.08 kg/m   Neurology awake and alert  ENT mild pallor  Cardiovascular heart with S1 and S2 present and rhythmic, no gallops or murmurs Pulmonary with no  wheezing, rales or rhonchi   Abdominal protuberant but not distended and not tender Skin Musculoskeletal hypertrophic knees   Data Reviewed:    Family Communication: I spoke with patient's daughter at the bedside, we talked in detail about patient's condition, plan of care and prognosis and all questions were addressed.   Disposition: Status is: Inpatient  Remains inpatient appropriate because: renal failure and heart failure            Author: Tawni Millers, MD 01/03/2022 1:15 PM  For on call review [email or web address].

## 2022-01-04 DIAGNOSIS — N189 Chronic kidney disease, unspecified: Secondary | ICD-10-CM

## 2022-01-04 LAB — GLUCOSE, CAPILLARY
Glucose-Capillary: 162 mg/dL — ABNORMAL HIGH (ref 70–99)
Glucose-Capillary: 199 mg/dL — ABNORMAL HIGH (ref 70–99)
Glucose-Capillary: 190 mg/dL — ABNORMAL HIGH (ref 70–99)
Glucose-Capillary: 161 mg/dL — ABNORMAL HIGH (ref 70–99)

## 2022-01-04 LAB — BASIC METABOLIC PANEL
Anion gap: 9 (ref 5–15)
BUN: 56 mg/dL — ABNORMAL HIGH (ref 8–23)
CO2: 29 mmol/L (ref 22–32)
Calcium: 8.9 mg/dL (ref 8.9–10.3)
Chloride: 99 mmol/L (ref 98–111)
Creatinine, Ser: 1.29 mg/dL — ABNORMAL HIGH (ref 0.44–1.00)
GFR, Estimated: 39 mL/min — ABNORMAL LOW (ref >60)
Glucose, Bld: 164 mg/dL — ABNORMAL HIGH (ref 70–99)
Potassium: 3.9 mmol/L (ref 3.5–5.1)
Sodium: 137 mmol/L (ref 135–145)

## 2022-01-04 MED ORDER — LIDOCAINE VISCOUS HCL 2 % MT SOLN
15.0000 mL | OROMUCOSAL | Status: DC | PRN
  Filled 2022-01-04: qty 15

## 2022-01-04 MED ORDER — MENTHOL 3 MG MT LOZG
1.0000 | LOZENGE | OROMUCOSAL | Status: DC | PRN
  Filled 2022-01-04: qty 9

## 2022-01-04 MED ORDER — PHENOL 1.4 % MT LIQD: 1.0000 | OROMUCOSAL | Status: DC | PRN

## 2022-01-04 NOTE — Progress Notes (Signed)
Progress Note   Patient: Debbie Arnold [id number]:[id number] DOB: May 29, 1927 DOA: 12/29/2021     5 DOS: the patient was seen and examined on 01/04/2022   Brief hospital course: Debbie Arnold was admitted to the hospital with the working diagnosis of acute heart failure decompensation.   88 yo female with the past medical history of heart failure, T2DM, and hypertension who presented with lower extremity edema. Her symptoms were associated with dyspnea, prompting her to come to the hospital. On her initial physical examination her blood pressure was 164/69, HR 74, RR 23 and 02 saturation 93%, her lungs had no wheezing or rhonchi, heart with S1 and S2 present and rhythmic, abdomen soft and positive lower extremity edema ++++.   Na 138, K 4,7, cl 101, bicarbonate 27, glucose 235, bun 34 and cr 1,09 Bnp 50 Wbc 5,1, hgb 11,8, hct 36,2 and plt 201 SARS COVID 19 negative  Urine analysis with SG 1,010 with 21-50 wbc and 21 to 50 rbc.   Chest radiograph with cardiomegaly with right upper lobe atelectasis.   EKG with 82 bpm, left axis, right bundle branch block, sinus rhythm, with no significant ST segment or T wave changes.   Patient was placed on furosemide with good toleration and improvement in volume status.  Continue to be very weak and deconditioned, PT and OT have recommended SNF but patient would like to go to CIR or home with home health.  Improved volume status but developed AKI.  Pending improvement in renal function before her discharge.   Assessment and Plan * Acute on chronic diastolic CHF (congestive heart failure) ([place])- (present on admission) Echocardiogram with preserved LV systolic function with EF 55 to 60%, RV preserved systolic function, no significant valvular disease is evident.   Patient with improved volume status and improvement in renal function Plan to resume diuresis with oral furosemide in am to maintain negative fluid balance.  Continue blood pressure  monitoring Continue with empagliflozin, losartan and metoprolol.   HTN (hypertension)- (present on admission) Systolic blood pressure 481 to 146 mmHg with HR in the 70 range.  Continue blood pressure control with metoprolol.  Continue diuresis in am.   Acute kidney injury superimposed on chronic kidney disease ([place])- (present on admission) CKD stage 3a.  Patient with improved volume status, his renal function today has a serum cr of 1,29 with K of 3,9 and serum bicarbonate of 29  Plan to continue holding furosemide today and resume in am Follow up renal function in am, avoid hypotension and nephrotoxic medications.    COPD (chronic obstructive pulmonary disease) ([place])- (present on admission) No signs of acute exacerbation Continue with budesonide and as needed albuterol.   Diabetes mellitus type 2 in obese [facility])- (present on admission) Fasting glucose this am is 164,  On insulin sliding scale for glucose cover and monitoring.    Hypothyroidism- (present on admission) On levothyroxine.   Acute lower UTI Resolved, completed antibiotic therapy   Depression Continue with escitalopram. Patient has been somnolent during the day, will discontinue trazodone.   Class 3 obesity (HCC) Calculated BMI is 43,7 with limited mobility,  Pending transfer to rehab.       Subjective: patient is feeling better but continue to be very weak, no nausea or vomiting, no dyspnea or chest pain, lower extremity edema has improved.   Objective BP (!) 136/47 (BP Location: Left Wrist)    Pulse 74    Temp 98 F (36.7 C) (Oral)    Resp 19  Ht 5' 5.5" (1.664 m)    Wt 121.3 kg    SpO2 95%    BMI 43.82 kg/m   Neurology  patient awake and alert ENT mild pallor  Cardiovascular heart with S1 and S2 present and rhythmic, no gallops or murmurs. Trace lower extremity edema, not pitting  Pulmonary anterior auscultation with no wheezing or rhonchi, no rales.   Abdominal soft and non tender, protuberant   Skin Musculoskeletal   Data Reviewed:    Family Communication: I spoke with patient's son at the bedside, we talked in detail about patient's condition, plan of care and prognosis and all questions were addressed.   Disposition: Status is: Inpatient  Remains inpatient appropriate because: pending transfer to rehab.            Author: Tawni Millers, MD 01/04/2022 1:54 PM  For on call review [email or web address].

## 2022-01-05 LAB — GLUCOSE, CAPILLARY
Glucose-Capillary: 155 mg/dL — ABNORMAL HIGH (ref 70–99)
Glucose-Capillary: 150 mg/dL — ABNORMAL HIGH (ref 70–99)
Glucose-Capillary: 126 mg/dL — ABNORMAL HIGH (ref 70–99)
Glucose-Capillary: 148 mg/dL — ABNORMAL HIGH (ref 70–99)

## 2022-01-05 MED ORDER — FUROSEMIDE 40 MG PO TABS
60.0000 mg | ORAL_TABLET | Freq: Every day | ORAL | Status: DC
  Administered 2022-01-05 – 2022-01-06 (×2): 60 mg via ORAL
  Filled 2022-01-05 (×2): qty 1

## 2022-01-05 NOTE — TOC Progression Note (Signed)
Transition of Care [facility]) - Progression Note    Patient Details  Name: SOSHA SHEPHERD MRN: [id number] Date of Birth: 1927/07/31  Transition of Care [facility]) CM/SW [place], [place] Phone Number: [phone] 01/05/2022, 10:46 AM  Clinical Narrative:     CSW was alerted by RN that pt's son [name] wanted to speak with CSW about SNF options. [name] explain that he has been researching facilities near his home and he would like for [facility] to be an option. CSW attempted to call [facility] however no admission staff on weekends. CSW explained to [name] that most likely the referral will need ot be faxed on Monday. He informed CSW that he wanted to research the Medicare.gov rating list that was provided.  TOC team will continue to assist with discharge planning needs.   Expected Discharge Plan: [place] Barriers to Discharge: Continued Medical Work up  Expected Discharge Plan and Services Expected Discharge Plan: [place] In-house Referral: Clinical Social Work   Post Acute Care Choice: [place] Living arrangements for the past 2 months: [place]                                       Social Determinants of Health (SDOH) Interventions    Readmission Risk Interventions No flowsheet data found.

## 2022-01-05 NOTE — Progress Notes (Signed)
Progress Note   Patient: Debbie Arnold HWK:[id number] DOB: 02-27-1927 DOA: 12/29/2021     6 DOS: the patient was seen and examined on 01/05/2022   Brief hospital course: Debbie Arnold was admitted to the hospital with the working diagnosis of acute heart failure decompensation.   88 yo female with the past medical history of heart failure, T2DM, and hypertension who presented with lower extremity edema. Her symptoms were associated with dyspnea, prompting her to come to the hospital. On her initial physical examination her blood pressure was 164/69, HR 74, RR 23 and 02 saturation 93%, her lungs had no wheezing or rhonchi, heart with S1 and S2 present and rhythmic, abdomen soft and positive lower extremity edema ++++.   Na 138, K 4,7, cl 101, bicarbonate 27, glucose 235, bun 34 and cr 1,09 Bnp 50 Wbc 5,1, hgb 11,8, hct 36,2 and plt 201 SARS COVID 19 negative  Urine analysis with SG 1,010 with 21-50 wbc and 21 to 50 rbc.   Chest radiograph with cardiomegaly with right upper lobe atelectasis.   EKG with 82 bpm, left axis, right bundle branch block, sinus rhythm, with no significant ST segment or T wave changes.   Patient was placed on furosemide with good toleration and improvement in volume status.  Continue to be very weak and deconditioned, PT and OT have recommended SNF but patient would like to go to CIR or home with home health.  Improved volume status but developed AKI that clinically has improved. Heart failure now compensated, but patient very weak and deconditioned. Her family would like patient to go to CIR but insurance has denied.  Family is planning to appeal.   Assessment and Plan * Acute on chronic diastolic CHF (congestive heart failure) ([place])- (present on admission) Echocardiogram with preserved LV systolic function with EF 55 to 60%, RV preserved systolic function, no significant valvular disease is evident.   Heart failure now compensated, medical therapy with  empagliflozin, losartan and metoprolol.  Continue with oral furosemide  Patient very weak and deconditioned.   HTN (hypertension)- (present on admission) Continue blood pressure control with losartan and metoprolol. Continue diuresis with oral furosemide    Acute kidney injury superimposed on chronic kidney disease ([place])- (present on admission) CKD stage 3a.  Her renal function has been improving, will plan to check renal panel in am. Avoid hypotension and nephrotoxic medications  Resume diuresis with oral furosemide    COPD (chronic obstructive pulmonary disease) ([place])- (present on admission) No signs of acute exacerbation Continue with budesonide and as needed albuterol.   Diabetes mellitus type 2 in obese [facility])- (present on admission) Fasting glucose this am is 155,  Continue with insulin sliding scale for glucose cover and monitoring.    Hypothyroidism- (present on admission) On levothyroxine.   Acute lower UTI Resolved, completed antibiotic therapy   Depression On escitalopram. Somnolence has improved, continue to hold on trazodone    Class 3 obesity (HCC) Calculated BMI is 43,7 with limited mobility,  Pending transfer to rehab.       Subjective: patient with no dyspnea or chest pain, continue to be very weak and deconditioned, not back to her baseline   Objective BP (!) 117/45 (BP Location: Left Wrist)    Pulse 71    Temp 97.6 F (36.4 C) (Oral)    Resp 19    Ht 5' 5.5" (1.664 m)    Wt 120.9 kg    SpO2 93%    BMI 43.68 kg/m   Neurology  patient is awake and alert, very weak and deconditioned  ENT no pallor  Cardiovascular heart with S1 and S2 present and rhythmic  Trace lower extremity edema  Pulmonary lungs with no wheezing or rhonchi, no rales  Abdominal soft and non tender, protuberant  Skin Musculoskeletal   Data Reviewed:    Family Communication: I spoke with patient's son at the bedside, we talked in detail about patient's condition, plan of  care and prognosis and all questions were addressed.   Disposition: Status is: Inpatient  Remains inpatient appropriate because: pending discharge planing            Author: Tawni Millers, MD 01/05/2022 5:13 PM  For on call review [email or web address].

## 2022-01-06 LAB — BASIC METABOLIC PANEL
Anion gap: 10 (ref 5–15)
BUN: 59 mg/dL — ABNORMAL HIGH (ref 8–23)
CO2: 27 mmol/L (ref 22–32)
Calcium: 9 mg/dL (ref 8.9–10.3)
Chloride: 101 mmol/L (ref 98–111)
Creatinine, Ser: 1.46 mg/dL — ABNORMAL HIGH (ref 0.44–1.00)
GFR, Estimated: 34 mL/min — ABNORMAL LOW (ref >60)
Glucose, Bld: 147 mg/dL — ABNORMAL HIGH (ref 70–99)
Potassium: 4.1 mmol/L (ref 3.5–5.1)
Sodium: 138 mmol/L (ref 135–145)

## 2022-01-06 LAB — GLUCOSE, CAPILLARY
Glucose-Capillary: 142 mg/dL — ABNORMAL HIGH (ref 70–99)
Glucose-Capillary: 162 mg/dL — ABNORMAL HIGH (ref 70–99)
Glucose-Capillary: 157 mg/dL — ABNORMAL HIGH (ref 70–99)
Glucose-Capillary: 180 mg/dL — ABNORMAL HIGH (ref 70–99)

## 2022-01-06 NOTE — Progress Notes (Signed)
Heart Failure Navigator Progress Note  Assessed for Heart & Vascular TOC clinic readiness.  Patient does not meet criteria due to decreased mobility--utilizing hoyer lift, incontinent.   Navigator available for educational resources.   Pricilla Holm, MSN, RN Heart Failure Nurse Navigator [phone]

## 2022-01-06 NOTE — Progress Notes (Signed)
Physical Therapy Treatment Patient Details Name: Debbie Arnold MRN: [id number] DOB: 08-Sep-1927 Today's Date: 01/06/2022   History of Present Illness Pt is a 88 y.o. female who presented 12/29/21 with edema and SOB. Pt admitted with CHF exacerbation. PMH: DM2, HTN, CHF, COPD, thyroid disease    PT Comments    The pt was eager to mobilize and was able to make slow but steady progress with OOB mobility at this time. She continues to require significant assist of 2 to complete bed mobility and attempt sit-stand transfers. She did complete x3 sit-stand transfers with assist of 2-3 with UE supported on locked recliner and bed pad used to complete hip lift and facilitate hip extension. The pt was able to maintain standing for 5-10 seconds at a time, but required seated rest between each bout. The pt will benefit from max therapies in attempt to progress towards prior level of function (sit-stand transfers with minA and standing for 10-15 min). The pt's son is hopeful that with enough therapy she could progress to completing pivot transfers with assist so that he could return home with the pt and care for her there.     Recommendations for follow up therapy are one component of a multi-disciplinary discharge planning process, led by the attending physician.  Recommendations may be updated based on patient status, additional functional criteria and insurance authorization.  Follow Up Recommendations  Skilled nursing-short term rehab (<3 hours/day) (pt would benefit from more intensive therapies such as AIR, but has already been denied as current admission dx is not appropriate for AIR level rehab (per admissions coordinator note from 1/26))     Assistance Recommended at Discharge Frequent or constant Supervision/Assistance  Patient can return home with the following Two people to help with walking and/or transfers;A lot of help with walking and/or transfers;A lot of help with bathing/dressing/bathroom;Two  people to help with bathing/dressing/bathroom;Assistance with cooking/housework;Direct supervision/assist for medications management;Direct supervision/assist for financial management;Assist for transportation;Help with stairs or ramp for entrance   Equipment Recommendations  Wheelchair (measurements PT);Wheelchair cushion (measurements PT);Hospital bed;Other (comment);BSC/3in1 (hoyer lift)    Recommendations for Other Services       Precautions / Restrictions Precautions Precautions: Fall Precaution Comments: uses hoyer lift baseline Restrictions Weight Bearing Restrictions: No     Mobility  Bed Mobility Overal bed mobility: Needs Assistance Bed Mobility: Rolling, Supine to Sit, Sit to Supine Rolling: Max assist, +2 for safety/equipment   Supine to sit: Max assist, +2 for physical assistance, HOB elevated Sit to supine: Total assist, +2 for physical assistance   General bed mobility comments: pt assisting some with BLE to move toEOB, unable to generate scoot without assist to move pad. totalA to return to bed as pt unable to scoot hips back on bed despite assist of 2    Transfers Overall transfer level: Needs assistance Equipment used: 2 person hand held assist (pulling up on recliner) Transfers: Sit to/from Stand Sit to Stand: Max assist, +2 physical assistance, +2 safety/equipment           General transfer comment: maxA of 2 with son assisting to steady recliner as pt pulled to stand. blocking of bilateral knees and asist with pad at hips. pt completed x3 with maxA and sustained 5-10 seconds    Ambulation/Gait               General Gait Details: pt unable       Balance Overall balance assessment: Needs assistance Sitting-balance support: Bilateral upper extremity supported, Feet  supported Sitting balance-Leahy Scale: Poor Sitting balance - Comments: pt able to complete with and without UE support, but unable to correct after leaning outside BOS    Standing balance support: Bilateral upper extremity supported, During functional activity Standing balance-Leahy Scale: Zero Standing balance comment: dependent on BUE support and maxA of 2 to complete with bed pad to lift hips and encourage hip extension                            Cognition Arousal/Alertness: Awake/alert Behavior During Therapy: WFL for tasks assessed/performed Overall Cognitive Status: Impaired/Different from baseline Area of Impairment: Awareness, Problem solving, Memory                     Memory: Decreased short-term memory     Awareness: Emergent Problem Solving: Slow processing, Difficulty sequencing, Requires verbal cues, Requires tactile cues General Comments: pt unaware of bowel movement, son states he thinks she did know but just did not tell therapist out of embarassment. able to follow simple cues, needs increased time        Exercises      General Comments General comments (skin integrity, edema, etc.): VSS on O2. son present and asking for CIR      Pertinent Vitals/Pain Pain Assessment Pain Assessment: Faces Faces Pain Scale: Hurts a little bit Pain Location: shoulders and lower legs Pain Descriptors / Indicators: Grimacing, Guarding, Discomfort Pain Intervention(s): Limited activity within patient's tolerance, Monitored during session, Repositioned     PT Goals (current goals can now be found in the care plan section) Acute Rehab PT Goals Patient Stated Goal: to sit up and improve PT Goal Formulation: With patient/family Time For Goal Achievement: 01/15/22 Potential to Achieve Goals: Fair Progress towards PT goals: Progressing toward goals    Frequency    Min 3X/week      PT Plan Current plan remains appropriate       AM-PAC PT "6 Clicks" Mobility   Outcome Measure  Help needed turning from your back to your side while in a flat bed without using bedrails?: A Lot Help needed moving from lying on your  back to sitting on the side of a flat bed without using bedrails?: A Lot Help needed moving to and from a bed to a chair (including a wheelchair)?: Total Help needed standing up from a chair using your arms (e.g., wheelchair or bedside chair)?: Total Help needed to walk in hospital room?: Total Help needed climbing 3-5 steps with a railing? : Total 6 Click Score: 8    End of Session Equipment Utilized During Treatment: Gait belt;Oxygen Activity Tolerance: Patient tolerated treatment well;Patient limited by fatigue Patient left: in bed;with call bell/phone within reach;with bed alarm set;with family/visitor present Nurse Communication: Mobility status;Need for lift equipment PT Visit Diagnosis: Muscle weakness (generalized) (M62.81);Difficulty in walking, not elsewhere classified (R26.2);Pain;Unsteadiness on feet (R26.81) Pain - part of body: Shoulder;Leg     Time: 4196-2229 PT Time Calculation (min) (ACUTE ONLY): 41 min  Charges:  $Therapeutic Exercise: 23-37 mins                     [place] Carbo, PT, DPT   Acute Rehabilitation Department Pager #: [phone]   Sandra Cockayne 01/06/2022, 3:20 PM

## 2022-01-06 NOTE — Care Management Important Message (Signed)
Important Message  Patient Details  Name: CANDYCE GAMBINO MRN: [id number] Date of Birth: 1927-05-31   Medicare Important Message Given:  Yes     Shelda Altes 01/06/2022, 7:58 AM

## 2022-01-06 NOTE — TOC Progression Note (Addendum)
Transition of Care [facility]) - Progression Note    Patient Details  Name: Debbie Arnold MRN: [id number] Date of Birth: 1926/12/24  Transition of Care [facility]) CM/SW Contact  Reece Agar, [place] Phone Number: 01/06/2022, 11:39 AM  Clinical Narrative:    CSW spoke with Neoma Laming admissions coordinator at [facility], she would like updated PT notes in order to confirm bed offer for pt. CSW reached out to PT to see pt for clinicals. CSW faxed out updated PT notes to admissions at [facility] in [place].  Pt son requested to speak to CSW in room about SNF choices. CSW updated pt son on pending decision at [facility] and let him know that a referral was sent to [facility] in [place], [place] is waiting on a follow up from them as well. CSW reiterated that pt has other bed offer and [place] and [facility] are both available and pt could DC tomorrow if he could finalize a bed decision. CSW will follow up with pt son in the morning after he visits [facility] for possible bed offer.  [place] cannot offer pt a bed.  Expected Discharge Plan: [place] Barriers to Discharge: Continued Medical Work up  Expected Discharge Plan and Services Expected Discharge Plan: [place] In-house Referral: Clinical Social Work   Post Acute Care Choice: [place] Living arrangements for the past 2 months: [place]                                       Social Determinants of Health (SDOH) Interventions    Readmission Risk Interventions No flowsheet data found.

## 2022-01-06 NOTE — Plan of Care (Signed)

## 2022-01-06 NOTE — Progress Notes (Addendum)
Progress Note   Patient: Debbie Arnold SWN:[id number] DOB: 11/11/1927 DOA: 12/29/2021     7 DOS: the patient was seen and examined on 01/06/2022   Brief hospital course: Debbie Arnold was admitted to the hospital with the working diagnosis of acute heart failure decompensation.   88 yo female with the past medical history of heart failure, T2DM, and hypertension who presented with lower extremity edema. Her symptoms were associated with dyspnea, prompting her to come to the hospital. On her initial physical examination her blood pressure was 164/69, HR 74, RR 23 and 02 saturation 93%, her lungs had no wheezing or rhonchi, heart with S1 and S2 present and rhythmic, abdomen soft and positive lower extremity edema ++++.   Na 138, K 4,7, cl 101, bicarbonate 27, glucose 235, bun 34 and cr 1,09 Bnp 50 Wbc 5,1, hgb 11,8, hct 36,2 and plt 201 SARS COVID 19 negative  Urine analysis with SG 1,010 with 21-50 wbc and 21 to 50 rbc.   Chest radiograph with cardiomegaly with right upper lobe atelectasis.   EKG with 82 bpm, left axis, right bundle branch block, sinus rhythm, with no significant ST segment or T wave changes.   Patient was placed on furosemide with good toleration and improvement in volume status.  Continue to be very weak and deconditioned, PT and OT have recommended SNF but patient would like to go to CIR or home with home health.  Improved volume status but developed AKI that clinically has improved. Heart failure now compensated, but patient very weak and deconditioned. Her family would like patient to go to CIR but insurance has denied.  Family is planning to appeal.   Assessment and Plan * Acute on chronic diastolic CHF (congestive heart failure) ([place])- (present on admission) Echocardiogram with preserved LV systolic function with EF 55 to 60%, RV preserved systolic function, no significant valvular disease is evident.   Continue with empagliflozin, losartan and metoprolol.  Hold  oral furosemide today due to elevated cr   Patient very weak and deconditioned.   HTN (hypertension)- (present on admission) On losartan and metoprolol. Blood pressure has been more stable.   Acute kidney injury superimposed on chronic kidney disease ([place])- (present on admission) CKD stage 3a.  Renal function with serum cr at 1,46, elevated compared to yesterday, her urine output is 1.450 ml K is 4,1 and bicarbonate is 27.  Plan to hold on furosemide today and follow up renal function in am.    COPD (chronic obstructive pulmonary disease) ([place])- (present on admission) No signs of acute exacerbation Continue with budesonide and as needed albuterol.   Diabetes mellitus type 2 in obese [facility])- (present on admission) Fasting glucose this am is 174,  On insulin sliding scale for glucose cover and monitoring.    Hypothyroidism- (present on admission) Continue with levothyroxine.   Acute lower UTI Resolved, completed antibiotic therapy   Depression Continue with escitalopram. Somnolence has improved, continue to hold on trazodone    Class 3 obesity (HCC)  BMI is 43,7 with limited mobility,  Pending transfer to rehab.       Subjective: Patient continue to feel very weak and deconditioned, no dyspnea and improved lower extremity edema.   Objective BP (!) 115/37    Pulse 65    Temp (!) 97.5 F (36.4 C) (Oral)    Resp 19    Ht 5' 5.5" (1.664 m)    Wt 121.3 kg    SpO2 96%    BMI 43.82 kg/m  Neurology  awake and alert ENT no pallor  Cardiovascular heart with S1 and S2 present and rhythmic  No JVD Positive lower extremity edema trace and non pitting.  Pulmonary  no wheezing or rhonchi  Abdominal protuberant with no distention  Skin Musculoskeletal   Data Reviewed:    Family Communication: I spoke with patient's father at the bedside, we talked in detail about patient's condition, plan of care and prognosis and all questions were addressed.   Disposition: Status is:  Inpatient  Remains inpatient appropriate because: pending transfer to rehab           Author: Tawni Millers, MD 01/06/2022 3:12 PM  For on call review [email or web address].

## 2022-01-07 ENCOUNTER — Other Ambulatory Visit (HOSPITAL_COMMUNITY): Payer: Self-pay

## 2022-01-07 LAB — BASIC METABOLIC PANEL
Anion gap: 10 (ref 5–15)
BUN: 59 mg/dL — ABNORMAL HIGH (ref 8–23)
CO2: 27 mmol/L (ref 22–32)
Calcium: 9 mg/dL (ref 8.9–10.3)
Chloride: 101 mmol/L (ref 98–111)
Creatinine, Ser: 1.21 mg/dL — ABNORMAL HIGH (ref 0.44–1.00)
GFR, Estimated: 42 mL/min — ABNORMAL LOW (ref >60)
Glucose, Bld: 152 mg/dL — ABNORMAL HIGH (ref 70–99)
Potassium: 4.3 mmol/L (ref 3.5–5.1)
Sodium: 138 mmol/L (ref 135–145)

## 2022-01-07 LAB — RESP PANEL BY RT-PCR (FLU A&B, COVID) ARPGX2
Influenza A by PCR: NEGATIVE
Influenza B by PCR: NEGATIVE
SARS Coronavirus 2 by RT PCR: NEGATIVE

## 2022-01-07 LAB — GLUCOSE, CAPILLARY
Glucose-Capillary: 148 mg/dL — ABNORMAL HIGH (ref 70–99)
Glucose-Capillary: 221 mg/dL — ABNORMAL HIGH (ref 70–99)
Glucose-Capillary: 190 mg/dL — ABNORMAL HIGH (ref 70–99)
Glucose-Capillary: 223 mg/dL — ABNORMAL HIGH (ref 70–99)

## 2022-01-07 MED ORDER — METOPROLOL SUCCINATE ER 50 MG PO TB24
50.0000 mg | ORAL_TABLET | Freq: Every day | ORAL | 0 refills | Status: AC
  Filled 2022-01-07: qty 30, 30d supply, fill #0

## 2022-01-07 MED ORDER — EMPAGLIFLOZIN 10 MG PO TABS
10.0000 mg | ORAL_TABLET | Freq: Every day | ORAL | 0 refills | Status: AC
Start: 2022-01-08 — End: ?
  Filled 2022-01-07: qty 30, 30d supply, fill #0

## 2022-01-07 MED ORDER — FUROSEMIDE 40 MG PO TABS
60.0000 mg | ORAL_TABLET | Freq: Every day | ORAL | 0 refills | Status: DC
  Filled 2022-01-07: qty 30, 20d supply, fill #0

## 2022-01-07 NOTE — Discharge Summary (Addendum)
Physician Discharge Summary  Debbie Arnold QQP:[id number] DOB: 1927/01/07 DOA: 12/29/2021  PCP: Debbie Hoist, MD  Admit date: 12/29/2021 Discharge date: 01/07/2022  Admitted From: Assisted living facility   Disposition:   SNF  Recommendations for Outpatient Follow-up and new medication changes:  Follow up with Dr. Patrick Arnold in 7 to 10 days Continue diuresis with furosemide Follow up renal function and electrolytes in 7 days.  Discontinued diltiazem and patient has been placed on metoprolol Started on empagliflozin   I spoke with patient's daughter (son over the phone) at the bedside, we talked in detail about patient's condition, plan of care and prognosis and all questions were addressed.   Home Health: na  Equipment/Devices: na    Discharge Condition: stable  CODE STATUS: full  Diet recommendation:  heart healthy   Brief/Interim Summary: Debbie Arnold was admitted to the hospital with the working diagnosis of acute heart failure decompensation.    88 yo female with the past medical history of heart failure, T2DM, and hypertension who presented with lower extremity edema. Her symptoms were associated with dyspnea, prompting her to come to the hospital. On her initial physical examination her blood pressure was 164/69, HR 74, RR 23 and 02 saturation 93%, her lungs had no wheezing or rhonchi, heart with S1 and S2 present and rhythmic, abdomen soft and positive lower extremity edema ++++.    Na 138, K 4,7, cl 101, bicarbonate 27, glucose 235, bun 34 and cr 1,09 Bnp 50 Wbc 5,1, hgb 11,8, hct 36,2 and plt 201 SARS COVID 19 negative   Urine analysis with SG 1,010 with 21-50 wbc and 21 to 50 rbc.    Chest radiograph with cardiomegaly with right upper lobe atelectasis.    EKG with 82 bpm, left axis, right bundle branch block, sinus rhythm, with no significant ST segment or T wave changes.    Patient was placed on furosemide with good toleration and improvement in volume status.    Continue to be very weak and deconditioned, PT and OT have recommended SNF but patient would like to go to CIR or home with home health.   Improved volume status but developed AKI that clinically has improved. Patient is being transfer to SNF to continue physical therapy and occupational therapy.   Acute on chronic diastolic CHF (congestive heart failure) ([place])- (present on admission). Patient was admitted to the cardiac ward and placed on a telemetry monitoring.  She was placed on IV furosemide for diuresis, a negative fluid balance was achieved with significant improvement of her edema.   Further work up with echocardiogram showed preserved LV systolic function with EF 55 to 60%, RV preserved systolic function, no significant valvular disease is evident.    Patient has been placed on heart failure regimen with empagliflozin, losartan and metoprolol.  Continue oral furosemide and follow up renal function as outpatient. Patient is very deconditioned with poor mobility, multiple comorbid conditions and poor prognosis.    2. HTN (hypertension)- (present on admission) Continue blood pressure control with  losartan and metoprolol.  3. Acute kidney injury superimposed on chronic kidney disease ([place])- (present on admission) CKD stage 3a.  Patient developed AKI related to diuresis, she was placed on supportive medical therapy and furosemide has held.  Follow up renal function with a serum cr trending down to 1.21 with K at 4,3 and serum bicarbonate at 27. Plan to continue blood pressure control and diuresis, will need close follow up on renal function as outpatient.    4.  COPD (chronic obstructive pulmonary disease) ([place])- (present on admission) No signs of acute exacerbation Continue with budesonide and as needed albuterol.    5. Diabetes mellitus type 2 in obese ([place])- (present on admission) Her glucose remained well controlled during her hospitalization, her fasting glucose at discharge  was 152, She did received insulin sliding scale during her hospitalization with good toleration.    6. Hypothyroidism (present on admission) On levothyroxine.    7. Acute lower UTI Patient was treated with ceftriaxone with good toleration, her urine culture was negative Completed therapy during her hospitalization    8. Depression On escitalopram.  9. Class 3 obesity (HCC)  BMI is 43,7 with limited mobility,  Pending transfer to rehab.  10 Anemia of chronic disease. Her hgb has remained stable, her discharge hgb is 11.2 and hct at 34,8    Discharge Diagnoses:  Principal Problem:   Acute on chronic diastolic CHF (congestive heart failure) (HCC) Active Problems:   HTN (hypertension)   Acute kidney injury superimposed on chronic kidney disease (HCC)   COPD (chronic obstructive pulmonary disease) (HCC)   Diabetes mellitus type 2 in obese (HCC)   Hypothyroidism   Acute lower UTI   Depression   Class 3 obesity ([place])    Discharge Instructions   Allergies as of 01/07/2022       Reactions   Brimonidine Itching   Celecoxib Other (See Comments)   Red Dye Other (See Comments)   Codeine Anxiety        Medication List     STOP taking these medications    diltiazem 180 MG 24 hr capsule Commonly known as: TIAZAC   nitrofurantoin (macrocrystal-monohydrate) 100 MG capsule Commonly known as: MACROBID   polyethylene glycol powder 17 GM/SCOOP powder Commonly known as: GlycoLax       TAKE these medications    acetaminophen 500 MG tablet Commonly known as: TYLENOL Take 1,000 mg by mouth 3 (three) times daily.   albuterol 108 (90 Base) MCG/ACT inhaler Commonly known as: VENTOLIN HFA Inhale 2 puffs into the lungs every 4 (four) hours as needed for wheezing or shortness of breath.   Anoro Ellipta 62.5-25 MCG/ACT Aepb Generic drug: umeclidinium-vilanterol Inhale 1 puff into the lungs daily.   aspirin 81 MG chewable tablet Take 81 mg by mouth daily.   budesonide  0.5 MG/2ML nebulizer solution Commonly known as: PULMICORT Take 0.5 mg by nebulization 2 (two) times daily.   cyclobenzaprine 5 MG tablet Commonly known as: FLEXERIL Take 5 mg by mouth 3 (three) times daily.   diclofenac Sodium 1 % Gel Commonly known as: VOLTAREN Apply 2-4 g topically in the morning and at bedtime. Left shoulder/arm   docusate sodium 100 MG capsule Commonly known as: COLACE Take 100 mg by mouth 2 (two) times daily.   dorzolamide 2 % ophthalmic solution Commonly known as: TRUSOPT Place 1 drop into both eyes 2 (two) times daily.   doxazosin 1 MG tablet Commonly known as: CARDURA Take 1 mg by mouth daily.   empagliflozin 10 MG Tabs tablet Commonly known as: JARDIANCE Take 1 tablet (10 mg total) by mouth daily. Start taking on: January 08, 2022   escitalopram 10 MG tablet Commonly known as: LEXAPRO Take 10 mg by mouth daily.   fluticasone 50 MCG/ACT nasal spray Commonly known as: FLONASE Place 2 sprays into both nostrils in the morning and at bedtime.   folic acid 1 MG tablet Commonly known as: FOLVITE Take 5 mg by mouth daily.   furosemide  40 MG tablet Commonly known as: LASIX Take 1.5 tablets (60 mg total) by mouth daily. What changed: how much to take   gabapentin 100 MG capsule Commonly known as: NEURONTIN Take 300 mg by mouth 2 (two) times daily.   glipiZIDE 5 MG tablet Commonly known as: GLUCOTROL Take 5 mg by mouth daily before breakfast.   levothyroxine 125 MCG tablet Commonly known as: SYNTHROID Take 125 mcg by mouth daily before breakfast.   losartan 100 MG tablet Commonly known as: COZAAR Take 100 mg by mouth daily.   metoprolol succinate 50 MG 24 hr tablet Commonly known as: TOPROL-XL Take 1 tablet (50 mg total) by mouth daily. Take with or immediately following a meal. Start taking on: January 08, 2022   omeprazole 40 MG capsule Commonly known as: PRILOSEC Take 40 mg by mouth daily.   psyllium 0.52 g capsule Commonly  known as: REGULOID Take 0.52 g by mouth at bedtime.   SALONPAS PAIN RELIEF PATCH EX Apply 1 patch topically See admin instructions. Apply to left shoulder every 12 hours as needed for pain. Leave on for 12 hours, then off for 12 hours   senna 8.6 MG tablet Commonly known as: SENOKOT Take 1 tablet by mouth at bedtime.        Allergies  Allergen Reactions   Brimonidine Itching   Celecoxib Other (See Comments)   Red Dye Other (See Comments)   Codeine Anxiety      Procedures/Studies: DG Chest Portable 1 View  Result Date: 12/29/2021 CLINICAL DATA:  Dyspnea EXAM: PORTABLE CHEST 1 VIEW COMPARISON:  09/20/2017 FINDINGS: Cardiomegaly with central congestion. Linear scarring or atelectasis in the right mid lung. Aortic atherosclerosis. No pleural effusion or pneumothorax IMPRESSION: Cardiomegaly with mild central congestion Electronically Signed   By: Donavan Foil M.D.   On: 12/29/2021 21:13   ECHOCARDIOGRAM COMPLETE  Result Date: 12/30/2021    ECHOCARDIOGRAM REPORT   Patient Name:   Debbie Arnold Date of Exam: 12/30/2021 Medical Rec #:  [id number]     Height:       65.0 in Accession #:    [id number]    Weight:       245.0 lb Date of Birth:  1927-05-18     BSA:          2.156 m Patient Age:    67 years      BP:           147/60 mmHg Patient Gender: F             HR:           83 bpm. Exam Location:  Inpatient Procedure: 2D Echo, Cardiac Doppler and Color Doppler Indications:    ACUTE CHF SYSTOLIC  History:        Patient has no prior history of Echocardiogram examinations.                 Risk Factors:Diabetes and Hypertension. HF r EF.  Sonographer:    Beryle Beams Referring Phys: [phone] JARED M GARDNER  Sonographer Comments: Suboptimal parasternal window, suboptimal apical window, no subcostal window and patient is morbidly obese. Image acquisition challenging due to respiratory motion and Image acquisition challenging due to patient body habitus. IMPRESSIONS  1. Left ventricular ejection  fraction, by estimation, is 55 to 60%. The left ventricle has normal function. The left ventricle has no regional wall motion abnormalities. There is mild left ventricular hypertrophy. Left ventricular diastolic parameters are consistent with Grade I diastolic  dysfunction (impaired relaxation).  2. Right ventricular systolic function is normal. The right ventricular size is normal.  3. The mitral valve is normal in structure. No evidence of mitral valve regurgitation. No evidence of mitral stenosis.  4. Valve not well seen Mild gradients and DVI 0.49 but valve area calculates low Doubt significant AS . The aortic valve was not well visualized. There is mild calcification of the aortic valve. There is mild thickening of the aortic valve. Aortic valve  regurgitation is not visualized. Aortic valve sclerosis is present, with no evidence of aortic valve stenosis.  5. The inferior vena cava is normal in size with greater than 50% respiratory variability, suggesting right atrial pressure of 3 mmHg. FINDINGS  Left Ventricle: Left ventricular ejection fraction, by estimation, is 55 to 60%. The left ventricle has normal function. The left ventricle has no regional wall motion abnormalities. The left ventricular internal cavity size was normal in size. There is  mild left ventricular hypertrophy. Left ventricular diastolic parameters are consistent with Grade I diastolic dysfunction (impaired relaxation). Right Ventricle: The right ventricular size is normal. No increase in right ventricular wall thickness. Right ventricular systolic function is normal. Left Atrium: Left atrial size was normal in size. Right Atrium: Right atrial size was normal in size. Pericardium: There is no evidence of pericardial effusion. Mitral Valve: The mitral valve is normal in structure. No evidence of mitral valve regurgitation. No evidence of mitral valve stenosis. Tricuspid Valve: The tricuspid valve is normal in structure. Tricuspid valve  regurgitation is not demonstrated. No evidence of tricuspid stenosis. Aortic Valve: Valve not well seen Mild gradients and DVI 0.49 but valve area calculates low Doubt significant AS. The aortic valve was not well visualized. There is mild calcification of the aortic valve. There is mild thickening of the aortic valve. Aortic valve regurgitation is not visualized. Aortic valve sclerosis is present, with no evidence of aortic valve stenosis. Aortic valve mean gradient measures 4.0 mmHg. Aortic valve peak gradient measures 8.1 mmHg. Aortic valve area, by VTI measures 1.39 cm. Pulmonic Valve: The pulmonic valve was normal in structure. Pulmonic valve regurgitation is not visualized. No evidence of pulmonic stenosis. Aorta: The aortic root is normal in size and structure. Venous: The inferior vena cava is normal in size with greater than 50% respiratory variability, suggesting right atrial pressure of 3 mmHg. IAS/Shunts: No atrial level shunt detected by color flow Doppler.  LEFT VENTRICLE PLAX 2D LVIDd:         4.40 cm LVIDs:         1.60 cm LV PW:         1.70 cm LV IVS:        1.30 cm LVOT diam:     1.90 cm LV SV:         38 LV SV Index:   18 LVOT Area:     2.84 cm  LV Volumes (MOD) LV vol d, MOD A2C: 26.6 ml LV vol d, MOD A4C: 48.4 ml LV vol s, MOD A2C: 11.7 ml LV vol s, MOD A4C: 25.9 ml LV SV MOD A2C:     14.9 ml LV SV MOD A4C:     48.4 ml LV SV MOD BP:      18.2 ml RIGHT VENTRICLE RV S prime:     12.50 cm/s TAPSE (M-mode): 2.3 cm LEFT ATRIUM             Index LA diam:        3.00  cm 1.39 cm/m LA Vol (A2C):   52.7 ml 24.44 ml/m LA Vol (A4C):   32.7 ml 15.16 ml/m LA Biplane Vol: 45.1 ml 20.92 ml/m  AORTIC VALVE AV Area (Vmax):    1.28 cm AV Area (Vmean):   1.21 cm AV Area (VTI):     1.39 cm AV Vmax:           142.00 cm/s AV Vmean:          97.200 cm/s AV VTI:            0.276 m AV Peak Grad:      8.1 mmHg AV Mean Grad:      4.0 mmHg LVOT Vmax:         63.90 cm/s LVOT Vmean:        41.600 cm/s LVOT VTI:           0.135 m LVOT/AV VTI ratio: 0.49  AORTA Ao Root diam: 4.10 cm MITRAL VALVE MV Area (PHT): 2.50 cm     SHUNTS MV Decel Time: 303 msec     Systemic VTI:  0.14 m MV E velocity: 85.20 cm/s   Systemic Diam: 1.90 cm MV A velocity: 153.00 cm/s MV E/A ratio:  0.56 Jenkins Rouge MD Electronically signed by Jenkins Rouge MD Signature Date/Time: 12/30/2021/12:04:59 PM    Final       Subjective: Patient continue to be very weak and deconditioned, no dyspnea and lower extremity edema has improved, no nausea or vomiting.   Discharge Exam: Vitals:   01/07/22 0904 01/07/22 1151  BP:  (!) 118/52  Pulse:    Resp:  18  Temp:  97.9 F (36.6 C)  SpO2: 96%    Vitals:   01/07/22 0523 01/07/22 0526 01/07/22 0904 01/07/22 1151  BP:  (!) 131/57  (!) 118/52  Pulse:  66    Resp:    18  Temp: 98 F (36.7 C)   97.9 F (36.6 C)  TempSrc: Oral   Oral  SpO2:  96% 96%   Weight:      Height:        General: Not in pain or dyspnea, very weak and deconditioned  Neurology: Awake and alert, non focal  E ENT: no pallor, no icterus, oral mucosa moist Cardiovascular: No JVD. S1-S2 present, rhythmic, no gallops, rubs, or murmurs.  Trace non pitting bilateral lower extremity edema. Pulmonary:  positive breath sounds bilaterally, with no wheezing, rhonchi or rales. Gastrointestinal. Abdomen protuberant but not tender or distended Musculoskeletal: no joint deformities   The results of significant diagnostics from this hospitalization (including imaging, microbiology, ancillary and laboratory) are listed below for reference.     Microbiology: Recent Results (from the past 240 hour(s))  Resp Panel by RT-PCR (Flu A&B, Covid) Nasopharyngeal Swab     Status: None   Collection Time: 12/30/21 12:15 AM   Specimen: Nasopharyngeal Swab; Nasopharyngeal(NP) swabs in vial transport medium  Result Value Ref Range Status   SARS Coronavirus 2 by RT PCR NEGATIVE NEGATIVE Final    Comment: (NOTE) SARS-CoV-2 target nucleic  acids are NOT DETECTED.  The SARS-CoV-2 RNA is generally detectable in upper respiratory specimens during the acute phase of infection. The lowest concentration of SARS-CoV-2 viral copies this assay can detect is 138 copies/mL. A negative result does not preclude SARS-Cov-2 infection and should not be used as the sole basis for treatment or other patient management decisions. A negative result may occur with  improper specimen collection/handling, submission of specimen other than  nasopharyngeal swab, presence of viral mutation(s) within the areas targeted by this assay, and inadequate number of viral copies(<138 copies/mL). A negative result must be combined with clinical observations, patient history, and epidemiological information. The expected result is Negative.  Fact Sheet for Patients:  EntrepreneurPulse.com.au  Fact Sheet for Healthcare Providers:  IncredibleEmployment.be  This test is no t yet approved or cleared by the [place] FDA and  has been authorized for detection and/or diagnosis of SARS-CoV-2 by FDA under an Emergency Use Authorization (EUA). This EUA will remain  in effect (meaning this test can be used) for the duration of the COVID-19 declaration under Section 564(b)(1) of the Act, 21 U.S.C.section 360bbb-3(b)(1), unless the authorization is terminated  or revoked sooner.       Influenza A by PCR NEGATIVE NEGATIVE Final   Influenza B by PCR NEGATIVE NEGATIVE Final    Comment: (NOTE) The Xpert Xpress SARS-CoV-2/FLU/RSV plus assay is intended as an aid in the diagnosis of influenza from Nasopharyngeal swab specimens and should not be used as a sole basis for treatment. Nasal washings and aspirates are unacceptable for Xpert Xpress SARS-CoV-2/FLU/RSV testing.  Fact Sheet for Patients: EntrepreneurPulse.com.au  Fact Sheet for Healthcare Providers: IncredibleEmployment.be  This  test is not yet approved or cleared by the [place] FDA and has been authorized for detection and/or diagnosis of SARS-CoV-2 by FDA under an Emergency Use Authorization (EUA). This EUA will remain in effect (meaning this test can be used) for the duration of the COVID-19 declaration under Section 564(b)(1) of the Act, 21 U.S.C. section 360bbb-3(b)(1), unless the authorization is terminated or revoked.  Performed at [facility], [place] [address], [place], [place] [address]   MRSA Next Gen by PCR, Nasal     Status: None   Collection Time: 01/01/22 12:45 AM   Specimen: Nasal Mucosa; Nasal Swab  Result Value Ref Range Status   MRSA by PCR Next Gen NOT DETECTED NOT DETECTED Final    Comment: (NOTE) The GeneXpert MRSA Assay (FDA approved for NASAL specimens only), is one component of a comprehensive MRSA colonization surveillance program. It is not intended to diagnose MRSA infection nor to guide or monitor treatment for MRSA infections. Test performance is not FDA approved in patients less than 30 years old. Performed at [facility], [place] [address], [place], [place] [address]      Labs: BNP (last 3 results) Recent Labs    12/29/21 2134  BNP 67.3   Basic Metabolic Panel: Recent Labs  Lab 01/01/22 0456 01/02/22 0431 01/03/22 0308 01/04/22 0323 01/06/22 0343 01/07/22 0157  NA 137 139 137 137 138 138  K 4.2 4.1 4.1 3.9 4.1 4.3  CL 99 99 100 99 101 101  CO2 29 30 29 29 27 27   GLUCOSE 153* 153* 146* 164* 147* 152*  BUN 42* 52* 58* 56* 59* 59*  CREATININE 1.13* 1.26* 1.37* 1.29* 1.46* 1.21*  CALCIUM 9.2 9.2 8.9 8.9 9.0 9.0  MG 2.0  --   --   --   --   --   PHOS 4.5  --   --   --   --   --    Liver Function Tests: No results for input(s): AST, ALT, ALKPHOS, BILITOT, PROT, ALBUMIN in the last 168 hours. No results for input(s): LIPASE, AMYLASE in the last 168 hours. No results for input(s): AMMONIA in the last 168 hours. CBC: Recent Labs  Lab  01/01/22 0456  WBC 5.1  HGB  11.2*  HCT 34.8*  MCV 98.3  PLT 203   Cardiac Enzymes: No results for input(s): CKTOTAL, CKMB, CKMBINDEX, TROPONINI in the last 168 hours. BNP: Invalid input(s): POCBNP CBG: Recent Labs  Lab 01/06/22 1155 01/06/22 1622 01/06/22 2140 01/07/22 0529 01/07/22 1149  GLUCAP 162* 157* 180* 148* 221*   D-Dimer No results for input(s): DDIMER in the last 72 hours. Hgb A1c No results for input(s): HGBA1C in the last 72 hours. Lipid Profile No results for input(s): CHOL, HDL, LDLCALC, TRIG, CHOLHDL, LDLDIRECT in the last 72 hours. Thyroid function studies No results for input(s): TSH, T4TOTAL, T3FREE, THYROIDAB in the last 72 hours.  Invalid input(s): FREET3 Anemia work up No results for input(s): VITAMINB12, FOLATE, FERRITIN, TIBC, IRON, RETICCTPCT in the last 72 hours. Urinalysis    Component Value Date/Time   COLORURINE YELLOW 12/30/2021 0735   APPEARANCEUR CLOUDY (A) 12/30/2021 0735   LABSPEC 1.010 12/30/2021 0735   PHURINE 7.0 12/30/2021 0735   GLUCOSEU NEGATIVE 12/30/2021 0735   HGBUR MODERATE (A) 12/30/2021 0735   BILIRUBINUR NEGATIVE 12/30/2021 0735   KETONESUR NEGATIVE 12/30/2021 0735   PROTEINUR NEGATIVE 12/30/2021 0735   NITRITE POSITIVE (A) 12/30/2021 0735   LEUKOCYTESUR MODERATE (A) 12/30/2021 0735   Sepsis Labs Invalid input(s): PROCALCITONIN,  WBC,  LACTICIDVEN Microbiology Recent Results (from the past 240 hour(s))  Resp Panel by RT-PCR (Flu A&B, Covid) Nasopharyngeal Swab     Status: None   Collection Time: 12/30/21 12:15 AM   Specimen: Nasopharyngeal Swab; Nasopharyngeal(NP) swabs in vial transport medium  Result Value Ref Range Status   SARS Coronavirus 2 by RT PCR NEGATIVE NEGATIVE Final    Comment: (NOTE) SARS-CoV-2 target nucleic acids are NOT DETECTED.  The SARS-CoV-2 RNA is generally detectable in upper respiratory specimens during the acute phase of infection. The lowest concentration of SARS-CoV-2 viral copies  this assay can detect is 138 copies/mL. A negative result does not preclude SARS-Cov-2 infection and should not be used as the sole basis for treatment or other patient management decisions. A negative result may occur with  improper specimen collection/handling, submission of specimen other than nasopharyngeal swab, presence of viral mutation(s) within the areas targeted by this assay, and inadequate number of viral copies(<138 copies/mL). A negative result must be combined with clinical observations, patient history, and epidemiological information. The expected result is Negative.  Fact Sheet for Patients:  EntrepreneurPulse.com.au  Fact Sheet for Healthcare Providers:  IncredibleEmployment.be  This test is no t yet approved or cleared by the [place] FDA and  has been authorized for detection and/or diagnosis of SARS-CoV-2 by FDA under an Emergency Use Authorization (EUA). This EUA will remain  in effect (meaning this test can be used) for the duration of the COVID-19 declaration under Section 564(b)(1) of the Act, 21 U.S.C.section 360bbb-3(b)(1), unless the authorization is terminated  or revoked sooner.       Influenza A by PCR NEGATIVE NEGATIVE Final   Influenza B by PCR NEGATIVE NEGATIVE Final    Comment: (NOTE) The Xpert Xpress SARS-CoV-2/FLU/RSV plus assay is intended as an aid in the diagnosis of influenza from Nasopharyngeal swab specimens and should not be used as a sole basis for treatment. Nasal washings and aspirates are unacceptable for Xpert Xpress SARS-CoV-2/FLU/RSV testing.  Fact Sheet for Patients: EntrepreneurPulse.com.au  Fact Sheet for Healthcare Providers: IncredibleEmployment.be  This test is not yet approved or cleared by the [place] FDA and has been authorized for detection and/or diagnosis of SARS-CoV-2 by FDA under an Emergency  Use Authorization (EUA). This EUA  will remain in effect (meaning this test can be used) for the duration of the COVID-19 declaration under Section 564(b)(1) of the Act, 21 U.S.C. section 360bbb-3(b)(1), unless the authorization is terminated or revoked.  Performed at [facility], [place] [address], [place], [place] [address]   MRSA Next Gen by PCR, Nasal     Status: None   Collection Time: 01/01/22 12:45 AM   Specimen: Nasal Mucosa; Nasal Swab  Result Value Ref Range Status   MRSA by PCR Next Gen NOT DETECTED NOT DETECTED Final    Comment: (NOTE) The GeneXpert MRSA Assay (FDA approved for NASAL specimens only), is one component of a comprehensive MRSA colonization surveillance program. It is not intended to diagnose MRSA infection nor to guide or monitor treatment for MRSA infections. Test performance is not FDA approved in patients less than 59 years old. Performed at [facility], [place] [address] [place], [place] [address]      Time coordinating discharge: 45 minutes  SIGNED:   Tawni Millers, MD  [facility] 01/07/2022, 12:05 PM

## 2022-01-07 NOTE — TOC Progression Note (Addendum)
Transition of Care [facility]) - Progression Note    Patient Details  Name: ZURIA FOSDICK MRN: [id number] Date of Birth: 1927-02-12  Transition of Care [facility]) CM/SW Contact  Reece Agar, [place] Phone Number: 01/07/2022, 11:58 AM  Clinical Narrative:    CSW spoke with [facility] (does not have any beds) and [facility] (facility not able to offer a bed. CSW updated pt son who asked to look at [facility] facility list that was previously provided. CSW asked that pt son callback within 30 mins for bed offer.   CSW attempted to contact pt son, no answer. CSW contacted pt son again with no answer. CSW contacted pt sister, pt sister was not able to contact her brother and stated she will reach out to their other sister because she cannot make the decision to send the pt back to ALF.  CSW was able to contact pt son through pt daughter, pt son who asked if pt had more bed offers, pt son inquired about [facility], CSW informed him that there is no beds available. CSW updated pt son on [facility] and [facility] pt wants to visit facilities. CSW will follow up.  Expected Discharge Plan: [place] Barriers to Discharge: Continued Medical Work up  Expected Discharge Plan and Services Expected Discharge Plan: [place] In-house Referral: Clinical Social Work   Post Acute Care Choice: [place] Living arrangements for the past 2 months: [place]                                       Social Determinants of Health (SDOH) Interventions    Readmission Risk Interventions No flowsheet data found.

## 2022-01-07 NOTE — TOC Progression Note (Signed)
Transition of Care [facility]) - Progression Note    Patient Details  Name: Debbie Arnold MRN: [id number] Date of Birth: 1927/06/16  Transition of Care [facility]) CM/SW [place], [place] Phone Number: 01/07/2022, 4:20 PM  Clinical Narrative:    CSW took over pt's discharge planning and spoke with son and other family members on the phone to reiterate what previous CSW was attempting to convey. Pt is medically ready at this time. Pt has bed offers from [facility] and [facility]. Facilities he requested in [place] declined, there are no other offers in [place]. Pt was meant to discharge today, but needs to be able to discharge tomorrow. Family continued to ask about CIR and other facilities. CSW noted that pt has been here 9 days, and a decision needs to be made.   [name] became upset and accused CSW of being a bully and raised his voice, he then hung up. CSW consulted supervisor who spoke with pt's son and agreed for him to tour [facility] tomorrow. TOC will continue to follow for DC needs.   Expected Discharge Plan: [place] Barriers to Discharge: Continued Medical Work up  Expected Discharge Plan and Services Expected Discharge Plan: [place] In-house Referral: Clinical Social Work   Post Acute Care Choice: [place] Living arrangements for the past 2 months: [place] Expected Discharge Date: 01/07/22                                     Social Determinants of Health (SDOH) Interventions    Readmission Risk Interventions No flowsheet data found.

## 2022-01-07 NOTE — Plan of Care (Signed)
°  Problem: Clinical Measurements: Goal: Respiratory complications will improve Outcome: Progressing   Problem: Coping: Goal: Level of anxiety will decrease Outcome: Progressing   Problem: Elimination: Goal: Will not experience complications related to urinary retention Outcome: Progressing   Problem: Pain Managment: Goal: General experience of comfort will improve Outcome: Progressing   Problem: Safety: Goal: Ability to remain free from injury will improve Outcome: Progressing

## 2022-01-08 LAB — GLUCOSE, CAPILLARY
Glucose-Capillary: 131 mg/dL — ABNORMAL HIGH (ref 70–99)
Glucose-Capillary: 154 mg/dL — ABNORMAL HIGH (ref 70–99)
Glucose-Capillary: 182 mg/dL — ABNORMAL HIGH (ref 70–99)

## 2022-01-08 NOTE — Progress Notes (Signed)
Report given to [facility] nurse all questions answered.

## 2022-01-08 NOTE — Progress Notes (Signed)
Patient seen and examined, no changes from DC summary as noted by Dr. Cathlean Sauer 1/31  Domenic Polite, MD

## 2022-01-08 NOTE — TOC Transition Note (Signed)
Transition of Care [facility]) - CM/SW Discharge Note   Patient Details  Name: ERLINE SIDDOWAY MRN: [id number] Date of Birth: 1927/06/14  Transition of Care [facility]) CM/SW Contact:  Coralee Pesa, [place] Phone Number: 01/08/2022, 11:16 AM   Clinical Narrative:    Pt to be transported to [facility]. Nurse to call report to [phone]. Rm# [id number] [facility] approved 2/1 - 2/3 Ref # [id number] Plan # [id number] CM Arist Dagout.   Final next level of care: Skilled Nursing Facility Barriers to Discharge: Barriers Resolved   Patient Goals and CMS Choice Patient states their goals for this hospitalization and ongoing recovery are:: Rehab CMS Medicare.gov Compare Post Acute Care list provided to:: Patient Choice offered to / list presented to : Patient, Adult Children  Discharge Placement              Patient chooses bed at: [facility] Patient to be transferred to facility by: [place] Name of family member notified: Dahlia Bailiff Patient and family notified of of transfer: 01/08/22  Discharge Plan and Services In-house Referral: Clinical Social Work   Post Acute Care Choice: [place]                               Social Determinants of Health (SDOH) Interventions     Readmission Risk Interventions No flowsheet data found.

## 2022-01-26 ENCOUNTER — Emergency Department (HOSPITAL_BASED_OUTPATIENT_CLINIC_OR_DEPARTMENT_OTHER)
Admission: EM | Admit: 2022-01-26 | Discharge: 2022-01-27 | Disposition: A | Discharge status: Nursing Home (Includes ICF) | Payer: Medicare Other | Attending: Emergency Medicine | Admitting: Emergency Medicine

## 2022-01-26 ENCOUNTER — Other Ambulatory Visit: Payer: Self-pay

## 2022-01-26 ENCOUNTER — Encounter (HOSPITAL_BASED_OUTPATIENT_CLINIC_OR_DEPARTMENT_OTHER): Payer: Self-pay

## 2022-01-26 ENCOUNTER — Emergency Department (HOSPITAL_BASED_OUTPATIENT_CLINIC_OR_DEPARTMENT_OTHER): Payer: Medicare Other

## 2022-01-26 DIAGNOSIS — E1122 Type 2 diabetes mellitus with diabetic chronic kidney disease: Secondary | ICD-10-CM | POA: Diagnosis not present

## 2022-01-26 DIAGNOSIS — Z79899 Other long term (current) drug therapy: Secondary | ICD-10-CM | POA: Diagnosis not present

## 2022-01-26 DIAGNOSIS — I509 Heart failure, unspecified: Secondary | ICD-10-CM | POA: Diagnosis not present

## 2022-01-26 DIAGNOSIS — I130 Hypertensive heart and chronic kidney disease with heart failure and stage 1 through stage 4 chronic kidney disease, or unspecified chronic kidney disease: Secondary | ICD-10-CM | POA: Diagnosis not present

## 2022-01-26 DIAGNOSIS — R1084 Generalized abdominal pain: Secondary | ICD-10-CM

## 2022-01-26 DIAGNOSIS — K5900 Constipation, unspecified: Secondary | ICD-10-CM | POA: Insufficient documentation

## 2022-01-26 DIAGNOSIS — J449 Chronic obstructive pulmonary disease, unspecified: Secondary | ICD-10-CM | POA: Insufficient documentation

## 2022-01-26 DIAGNOSIS — N189 Chronic kidney disease, unspecified: Secondary | ICD-10-CM | POA: Insufficient documentation

## 2022-01-26 DIAGNOSIS — M109 Gout, unspecified: Secondary | ICD-10-CM

## 2022-01-26 DIAGNOSIS — I2699 Other pulmonary embolism without acute cor pulmonale: Secondary | ICD-10-CM

## 2022-01-26 DIAGNOSIS — N183 Chronic kidney disease, stage 3 unspecified: Secondary | ICD-10-CM

## 2022-01-26 DIAGNOSIS — Z7982 Long term (current) use of aspirin: Secondary | ICD-10-CM | POA: Insufficient documentation

## 2022-01-26 HISTORY — DX: Chronic obstructive pulmonary disease, unspecified: J44.9

## 2022-01-26 HISTORY — DX: Gout, unspecified: M10.9

## 2022-01-26 HISTORY — DX: Chronic kidney disease, stage 3 unspecified: N18.30

## 2022-01-26 HISTORY — DX: Other pulmonary embolism without acute cor pulmonale: I26.99

## 2022-01-26 LAB — CBC WITH DIFFERENTIAL/PLATELET
Abs Immature Granulocytes: 0.02 10*3/uL (ref 0.00–0.07)
Basophils Absolute: 0 10*3/uL (ref 0.0–0.1)
Basophils Relative: 1 %
Eosinophils Absolute: 0.3 10*3/uL (ref 0.0–0.5)
Eosinophils Relative: 6 %
HCT: 38.3 % (ref 36.0–46.0)
Hemoglobin: 12.1 g/dL (ref 12.0–15.0)
Immature Granulocytes: 0 %
Lymphocytes Relative: 34 %
Lymphs Abs: 1.7 10*3/uL (ref 0.7–4.0)
MCH: 30.6 pg (ref 26.0–34.0)
MCHC: 31.6 g/dL (ref 30.0–36.0)
MCV: 97 fL (ref 80.0–100.0)
Monocytes Absolute: 0.5 10*3/uL (ref 0.1–1.0)
Monocytes Relative: 10 %
Neutro Abs: 2.4 10*3/uL (ref 1.7–7.7)
Neutrophils Relative %: 49 %
Platelets: 198 10*3/uL (ref 150–400)
RBC: 3.95 MIL/uL (ref 3.87–5.11)
RDW: 13.2 % (ref 11.5–15.5)
WBC: 4.9 10*3/uL (ref 4.0–10.5)
nRBC: 0 % (ref 0.0–0.2)

## 2022-01-26 LAB — COMPREHENSIVE METABOLIC PANEL
ALT: 9 U/L (ref 0–44)
AST: 10 U/L — ABNORMAL LOW (ref 15–41)
Albumin: 3.9 g/dL (ref 3.5–5.0)
Alkaline Phosphatase: 34 U/L — ABNORMAL LOW (ref 38–126)
Anion gap: 8 (ref 5–15)
BUN: 30 mg/dL — ABNORMAL HIGH (ref 8–23)
CO2: 29 mmol/L (ref 22–32)
Calcium: 9.6 mg/dL (ref 8.9–10.3)
Chloride: 101 mmol/L (ref 98–111)
Creatinine, Ser: 0.88 mg/dL (ref 0.44–1.00)
GFR, Estimated: >60 mL/min (ref >60)
Glucose, Bld: 177 mg/dL — ABNORMAL HIGH (ref 70–99)
Potassium: 4.3 mmol/L (ref 3.5–5.1)
Sodium: 138 mmol/L (ref 135–145)
Total Bilirubin: 0.3 mg/dL (ref 0.3–1.2)
Total Protein: 7 g/dL (ref 6.5–8.1)

## 2022-01-26 LAB — LIPASE, BLOOD: Lipase: 15 U/L (ref 11–51)

## 2022-01-26 MED ORDER — IOHEXOL 300 MG/ML  SOLN
100.0000 mL | Freq: Once | INTRAMUSCULAR | Status: AC | PRN
  Administered 2022-01-26: 100 mL via INTRAVENOUS

## 2022-01-26 MED ORDER — MAGNESIUM CITRATE PO SOLN: 1.0000 | Freq: Once | ORAL | 1 refills | Status: AC

## 2022-01-26 NOTE — ED Notes (Signed)
Pt arrived by EMS. Lives at [facility]. Pt has been constipated for 3 days,not responsive to laxatives. Also hypertensive in 190'3/alert and orient x 4.

## 2022-01-26 NOTE — Discharge Instructions (Addendum)
Extensive work-up here showed normal complete metabolic panel actually renal function is now with a GFR greater than 60.  CBC no leukocytosis no significant anemia.  CT scan of the abdomen showed extensive constipation and rectal and throughout the colon.  Did a rectal disimpaction but stool is all pretty on the soft side.  Nothing significantly hard.  Removed is much as possible out of the rectum.  Would continue using the MiraLAX up to 4 times a day.  And have given a prescription for mag citrate.  They could also consider doing fleets enemas.  The mag citrate could be repeated again in a few days.

## 2022-01-26 NOTE — ED Provider Notes (Addendum)
[place] EMERGENCY DEPT Provider Note   CSN: [id number] Arrival date & time: 01/26/22  1726     History  Chief Complaint  Patient presents with   Constipation    Debbie Arnold is a 88 y.o. female.  Patient sent in from [facility].  Came in by EMS.  Patient with abdominal pain anterior part of the abdomen for 3 days.  There was concerns that maybe this pain was secondary to constipation but has not been proven.  She has had some difficulty in the past with constipation.  They have tried stool softeners and MiraLAX without any benefit no nausea or vomiting.  Patient is bed ridden.  Patient not able to walk on her own.  Patient with recent admission to the hospital January 22 through January 31 for acute pulmonary edema.  Past medical history sniffing for diabetes hypertension thyroid disease heart failure COPD stage III chronic kidney disease.      Home Medications Prior to Admission medications   Medication Sig Start Date End Date Taking? Authorizing Provider  acetaminophen (TYLENOL) 500 MG tablet Take 1,000 mg by mouth 3 (three) times daily.    [provider]  albuterol (PROVENTIL HFA;VENTOLIN HFA) 108 (90 BASE) MCG/ACT inhaler Inhale 2 puffs into the lungs every 4 (four) hours as needed for wheezing or shortness of breath.    [provider]  aspirin 81 MG chewable tablet Take 81 mg by mouth daily.    [provider]  budesonide (PULMICORT) 0.5 MG/2ML nebulizer solution Take 0.5 mg by nebulization 2 (two) times daily.    [provider]  cyclobenzaprine (FLEXERIL) 5 MG tablet Take 5 mg by mouth 3 (three) times daily.    [provider]  diclofenac Sodium (VOLTAREN) 1 % GEL Apply 2-4 g topically in the morning and at bedtime. Left shoulder/arm    [provider]  docusate sodium (COLACE) 100 MG capsule Take 100 mg by mouth 2 (two) times daily.    [provider]  dorzolamide (TRUSOPT) 2 %  ophthalmic solution Place 1 drop into both eyes 2 (two) times daily. 11/05/17   [provider]  doxazosin (CARDURA) 1 MG tablet Take 1 mg by mouth daily.    [provider]  empagliflozin (JARDIANCE) 10 MG TABS tablet Take 1 tablet (10 mg total) by mouth daily. 01/08/22   Arrien, Jimmy Picket, MD  escitalopram (LEXAPRO) 10 MG tablet Take 10 mg by mouth daily.    [provider]  fluticasone (FLONASE) 50 MCG/ACT nasal spray Place 2 sprays into both nostrils in the morning and at bedtime.    [provider]  folic acid (FOLVITE) 1 MG tablet Take 5 mg by mouth daily.    [provider]  furosemide (LASIX) 40 MG tablet Take 1.5 tablets (60 mg total) by mouth daily. 01/07/22   Arrien, Jimmy Picket, MD  gabapentin (NEURONTIN) 100 MG capsule Take 300 mg by mouth 2 (two) times daily.    [provider]  glipiZIDE (GLUCOTROL) 5 MG tablet Take 5 mg by mouth daily before breakfast.    [provider]  levothyroxine (SYNTHROID) 125 MCG tablet Take 125 mcg by mouth daily before breakfast.    [provider]  losartan (COZAAR) 100 MG tablet Take 100 mg by mouth daily.    [provider]  Menthol-Methyl Salicylate (SALONPAS PAIN RELIEF PATCH EX) Apply 1 patch topically See admin instructions. Apply to left shoulder every 12 hours as needed for pain. Leave  on for 12 hours, then off for 12 hours    [provider]  metoprolol succinate (TOPROL-XL) 50 MG 24 hr tablet Take 1 tablet (50 mg total) by mouth daily. Take with or immediately following a meal. 01/08/22 02/07/22  Arrien, Jimmy Picket, MD  omeprazole (PRILOSEC) 40 MG capsule Take 40 mg by mouth daily.    [provider]  psyllium (REGULOID) 0.52 g capsule Take 0.52 g by mouth at bedtime.    [provider]  senna (SENOKOT) 8.6 MG tablet Take 1 tablet by mouth at bedtime.    [provider]  umeclidinium-vilanterol (ANORO ELLIPTA) 62.5-25  MCG/ACT AEPB Inhale 1 puff into the lungs daily.    [provider]      Allergies    Brimonidine, Celecoxib, Red dye, and Codeine    Review of Systems   Review of Systems  Gastrointestinal:  Positive for abdominal pain and constipation. Negative for nausea and vomiting.   Physical Exam Updated Vital Signs BP (!) 132/96 (BP Location: Right Arm)    Pulse 72    Resp 18    SpO2 93%  Physical Exam Vitals and nursing note reviewed.  Constitutional:      General: She is not in acute distress.    Appearance: Normal appearance. She is well-developed.  HENT:     Head: Normocephalic and atraumatic.  Eyes:     Extraocular Movements: Extraocular movements intact.     Conjunctiva/sclera: Conjunctivae normal.     Pupils: Pupils are equal, round, and reactive to light.  Cardiovascular:     Rate and Rhythm: Normal rate and regular rhythm.     Heart sounds: No murmur heard. Pulmonary:     Effort: Pulmonary effort is normal. No respiratory distress.     Breath sounds: Normal breath sounds.  Abdominal:     Palpations: Abdomen is soft.     Tenderness: There is abdominal tenderness.     Comments: Mild tenderness to palpation throughout the anterior part of the abdomen.  Not distended.  No guarding.  Musculoskeletal:        General: No swelling.     Cervical back: Normal range of motion and neck supple.  Skin:    General: Skin is warm and dry.     Capillary Refill: Capillary refill takes less than 2 seconds.  Neurological:     General: No focal deficit present.     Mental Status: She is alert. Mental status is at baseline.  Psychiatric:        Mood and Affect: Mood normal.    ED Results / Procedures / Treatments   Labs (all labs ordered are listed, but only abnormal results are displayed) Labs Reviewed  COMPREHENSIVE METABOLIC PANEL - Abnormal; Notable for the following components:      Result Value   Glucose, Bld 177 (*)    BUN 30 (*)    AST 10 (*)    Alkaline Phosphatase  34 (*)    All other components within normal limits  CBC WITH DIFFERENTIAL/PLATELET  LIPASE, BLOOD    EKG None  Radiology CT Abdomen Pelvis W Contrast  Result Date: 01/26/2022 CLINICAL DATA:  88 year old female with acute abdominal and pelvic pain. Constipation. EXAM: CT ABDOMEN AND PELVIS WITH CONTRAST TECHNIQUE: Multidetector CT imaging of the abdomen and pelvis was performed using the standard protocol following bolus administration of intravenous contrast. RADIATION DOSE REDUCTION: This exam was performed according to the departmental dose-optimization program which includes automated exposure control, adjustment of  the mA and/or kV according to patient size and/or use of iterative reconstruction technique. CONTRAST:  148mL OMNIPAQUE IOHEXOL 300 MG/ML  SOLN COMPARISON:  08/01/2021 CT and prior studies FINDINGS: Lower chest: No acute abnormality. Hepatobiliary: The liver and gallbladder are unremarkable. There is no evidence of intrahepatic or extrahepatic biliary dilatation. Pancreas: Unremarkable Spleen: Unremarkable Adrenals/Urinary Tract: The kidneys, adrenal glands and bladder are unremarkable except for a LEFT renal cyst. Stomach/Bowel: A large amount of stool throughout the colon and rectum noted. There is no evidence of bowel wall thickening or inflammatory changes. No bowel obstruction identified. Vascular/Lymphatic: Aortic atherosclerosis. No enlarged abdominal or pelvic lymph nodes. Reproductive: Uterus and bilateral adnexa are unremarkable. Other: No ascites, focal collection or pneumoperitoneum. Musculoskeletal: No acute or suspicious bony abnormalities noted. Degenerative changes throughout the lumbar spine noted. IMPRESSION: 1. No evidence of acute abnormality. 2. Large amount of stool throughout the colon and rectum, compatible with constipation. No bowel wall thickening or inflammatory changes 3. Aortic Atherosclerosis (ICD10-I70.0). Electronically Signed   By: Margarette Canada M.D.   On:  01/26/2022 20:59    Procedures Procedures    Medications Ordered in ED Medications  iohexol (OMNIPAQUE) 300 MG/ML solution 100 mL (100 mLs Intravenous Contrast Given 01/26/22 2050)    ED Course/ Medical Decision Making/ A&P                           Medical Decision Making Amount and/or Complexity of Data Reviewed Labs: ordered. Radiology: ordered.  Risk Prescription drug management.   Check basic labs.  Include complete metabolic panel and lipase.  We will get an IV established.  Hopefully will be able to do CT abdomen pelvis with IV contrast.  But if renal function does not warrant that then we will do it without.  We will use the CT scan of the abdomen is guidance on whether we need to check rectal for impaction or whether we have another problem ongoing.  Patient's labs no leukocytosis white blood cell count 4.9.  Hemoglobin 12.1.  Complete metabolic panel without any significant abnormalities actually patient's renal functions proved significantly GFR is now greater than 60.  Lipase normal.  Liver function test without significant abnormality.  CT scan of the abdomen no evidence of any acute process but there was a large amount of stool throughout the colon and rectum compatible with constipation.  Did rectal disimpaction.  Stools PRETTY soft.  Does somewhat pasty but not hard.  Tried to remove as much out of the rectum as possible.  Think patient can go back to nursing facility.  Would recommend continuing with the MiraLAX maybe 4 times a day.  And would we will give her prescription for mag citrate.  Would recommend that she get that tomorrow.  Can maybe repeat that again in a few days.  Her electrolytes here were normal no signs of any obstruction.   Final Clinical Impression(s) / ED Diagnoses Final diagnoses:  Generalized abdominal pain  Constipation, unspecified constipation type    Rx / DC Orders ED Discharge Orders     None         Fredia Sorrow,  MD 01/26/22 Charlynne Cousins, MD 01/26/22 2329

## 2022-01-26 NOTE — ED Triage Notes (Signed)
She reports having no b.m. "for about a week". Her abd. Is rounc and soft and non-tender. She does c/o some vague (chronic) low back discomfort. She is awake, alert and in no distress. Her son is with her.

## 2022-02-25 ENCOUNTER — Emergency Department (HOSPITAL_BASED_OUTPATIENT_CLINIC_OR_DEPARTMENT_OTHER)
Admission: EM | Admit: 2022-02-25 | Discharge: 2022-02-25 | Disposition: A | Discharge status: Home / Self Care | Payer: Medicare Other | Attending: Emergency Medicine | Admitting: Emergency Medicine

## 2022-02-25 ENCOUNTER — Other Ambulatory Visit: Payer: Self-pay

## 2022-02-25 ENCOUNTER — Emergency Department (HOSPITAL_BASED_OUTPATIENT_CLINIC_OR_DEPARTMENT_OTHER): Payer: Medicare Other | Admitting: Radiology

## 2022-02-25 DIAGNOSIS — R0789 Other chest pain: Secondary | ICD-10-CM | POA: Diagnosis present

## 2022-02-25 DIAGNOSIS — I509 Heart failure, unspecified: Secondary | ICD-10-CM | POA: Insufficient documentation

## 2022-02-25 DIAGNOSIS — Z7982 Long term (current) use of aspirin: Secondary | ICD-10-CM | POA: Insufficient documentation

## 2022-02-25 DIAGNOSIS — R079 Chest pain, unspecified: Secondary | ICD-10-CM

## 2022-02-25 LAB — CBC
HCT: 39 % (ref 36.0–46.0)
Hemoglobin: 11.9 g/dL — ABNORMAL LOW (ref 12.0–15.0)
MCH: 30.1 pg (ref 26.0–34.0)
MCHC: 30.5 g/dL (ref 30.0–36.0)
MCV: 98.5 fL (ref 80.0–100.0)
Platelets: 221 10*3/uL (ref 150–400)
RBC: 3.96 MIL/uL (ref 3.87–5.11)
RDW: 13.2 % (ref 11.5–15.5)
WBC: 5.4 10*3/uL (ref 4.0–10.5)
nRBC: 0 % (ref 0.0–0.2)

## 2022-02-25 LAB — BASIC METABOLIC PANEL
Anion gap: 11 (ref 5–15)
BUN: 34 mg/dL — ABNORMAL HIGH (ref 8–23)
CO2: 29 mmol/L (ref 22–32)
Calcium: 10.3 mg/dL (ref 8.9–10.3)
Chloride: 100 mmol/L (ref 98–111)
Creatinine, Ser: 1.11 mg/dL — ABNORMAL HIGH (ref 0.44–1.00)
GFR, Estimated: 47 mL/min — ABNORMAL LOW (ref >60)
Glucose, Bld: 162 mg/dL — ABNORMAL HIGH (ref 70–99)
Potassium: 4.3 mmol/L (ref 3.5–5.1)
Sodium: 140 mmol/L (ref 135–145)

## 2022-02-25 LAB — BRAIN NATRIURETIC PEPTIDE: B Natriuretic Peptide: 86 pg/mL (ref 0.0–100.0)

## 2022-02-25 LAB — TROPONIN I (HIGH SENSITIVITY): Troponin I (High Sensitivity): 6 ng/L (ref <18)

## 2022-02-25 NOTE — ED Notes (Signed)
Patient transported to X-ray 

## 2022-02-25 NOTE — ED Provider Notes (Signed)
?[place] EMERGENCY DEPT ?Provider Note ? ? ?CSN: [id number] ?Arrival date & time: 02/25/22  3825 ? ?  ? ?History ? ?Chief Complaint  ?Patient presents with  ? Chest Pain  ? ? ?Debbie Arnold is a 88 y.o. female. ? ? ?Chest Pain ? ?88 year old female with medical history significant for CHF  (Last echocardiogram in January 2023 with EF 55 to 05% with diastolic dysfunction) who presents to the emergency department with the patient's son due to concern for possible CHF exacerbation.  The patient was reportedly weighed at her facility and was found to have a 10 pound weight gain since last week.  The patient's son is unsure if she was accurately weighed the first time.  She weighs 124 kg today which is less than what she was weighed at her facility.  Endorses some left-sided chest discomfort that occurred earlier today, up 4-10 in severity, subsequently now resolved.  The patient denies any symptoms at this time.  She denies any lower extremity swelling.  She denies any shortness of breath or cough.  She denies any fevers or chills.   ?Home Medications ?Prior to Admission medications   ?Medication Sig Start Date End Date Taking? Authorizing Provider  ?acetaminophen (TYLENOL) 500 MG tablet Take 1,000 mg by mouth 3 (three) times daily.   Yes [provider]  ?aspirin 81 MG chewable tablet Take 81 mg by mouth daily.   Yes [provider]  ?budesonide (PULMICORT) 0.5 MG/2ML nebulizer solution Take 0.5 mg by nebulization 2 (two) times daily.   Yes [provider]  ?cyclobenzaprine (FLEXERIL) 5 MG tablet Take 5 mg by mouth 3 (three) times daily.   Yes [provider]  ?diclofenac Sodium (VOLTAREN) 1 % GEL Apply 2-4 g topically in the morning and at bedtime. Left shoulder/arm   Yes [provider]  ?dorzolamide (TRUSOPT) 2 % ophthalmic solution Place 1 drop into both eyes 2 (two) times daily. 11/05/17  Yes [provider]  ?doxazosin (CARDURA) 1 MG tablet  Take 1 mg by mouth daily.   Yes [provider]  ?empagliflozin (JARDIANCE) 10 MG TABS tablet Take 1 tablet (10 mg total) by mouth daily. 01/08/22  Yes Arrien, Jimmy Picket, MD  ?escitalopram (LEXAPRO) 10 MG tablet Take 10 mg by mouth daily.   Yes [provider]  ?fluticasone (FLONASE) 50 MCG/ACT nasal spray Place 2 sprays into both nostrils in the morning and at bedtime.   Yes [provider]  ?folic acid (FOLVITE) 1 MG tablet Take 5 mg by mouth daily.   Yes [provider]  ?furosemide (LASIX) 40 MG tablet Take 1.5 tablets (60 mg total) by mouth daily. 01/07/22  Yes Arrien, Jimmy Picket, MD  ?gabapentin (NEURONTIN) 100 MG capsule Take 300 mg by mouth 2 (two) times daily.   Yes [provider]  ?glipiZIDE (GLUCOTROL) 5 MG tablet Take 5 mg by mouth daily before breakfast.   Yes [provider]  ?guaiFENesin (MUCINEX) 600 MG 12 hr tablet Take 1,200 mg by mouth 2 (two) times daily. (Every 12 hours)   Yes [provider]  ?levothyroxine (SYNTHROID) 125 MCG tablet Take 125 mcg by mouth daily before breakfast.   Yes [provider]  ?losartan (COZAAR) 100 MG tablet Take 100 mg by mouth daily.   Yes [provider]  ?magnesium citrate SOLN Take 150 mLs by mouth every 12 (twelve) hours.   Yes [provider]  ?metoprolol succinate (TOPROL-XL) 50 MG 24 hr tablet Take 1  tablet (50 mg total) by mouth daily. Take with or immediately following a meal. 01/08/22 02/25/22 Yes Arrien, Jimmy Picket, MD  ?omeprazole (PRILOSEC) 40 MG capsule Take 40 mg by mouth daily.   Yes [provider]  ?polyethylene glycol powder (GLYCOLAX/MIRALAX) 17 GM/SCOOP powder Take 1 Container by mouth See admin instructions. 1 scoop bid   Yes [provider]  ?psyllium (REGULOID) 0.52 g capsule Take 0.52 g by mouth at bedtime.   Yes [provider]  ?senna (SENOKOT) 8.6 MG tablet Take 2 tablets by mouth 2 (two) times daily.   Yes  [provider]  ?umeclidinium-vilanterol (ANORO ELLIPTA) 62.5-25 MCG/ACT AEPB Inhale 1 puff into the lungs daily.   Yes [provider]  ?albuterol (PROVENTIL HFA;VENTOLIN HFA) 108 (90 BASE) MCG/ACT inhaler Inhale 2 puffs into the lungs every 4 (four) hours as needed for wheezing or shortness of breath.    [provider]  ?Menthol-Methyl Salicylate (SALONPAS PAIN RELIEF PATCH EX) Apply 1 patch topically See admin instructions. Apply to left shoulder every 12 hours as needed for pain. Leave on for 12 hours, then off for 12 hours    [provider]  ?   ? ?Allergies    ?Brimonidine, Celecoxib, Red dye, and Codeine   ? ?Review of Systems   ?Review of Systems  ?Constitutional:  Positive for unexpected weight change.  ?Cardiovascular:  Positive for chest pain.  ?All other systems reviewed and are negative. ? ?Physical Exam ?Updated Vital Signs ?BP 140/72   Pulse 63   Temp 97.9 ?F (36.6 ?C)   Resp 18   Ht '5\' 5"'$  (1.651 m)   Wt 123.9 kg   SpO2 94%   BMI 45.45 kg/m?  ?Physical Exam ?Vitals and nursing note reviewed.  ?Constitutional:   ?   General: She is not in acute distress. ?   Appearance: She is well-developed.  ?HENT:  ?   Head: Normocephalic and atraumatic.  ?Eyes:  ?   Conjunctiva/sclera: Conjunctivae normal.  ?Neck:  ?   Vascular: No JVD.  ?Cardiovascular:  ?   Rate and Rhythm: Normal rate and regular rhythm.  ?   Heart sounds: No murmur heard. ?Pulmonary:  ?   Effort: Pulmonary effort is normal. No respiratory distress.  ?   Breath sounds: Normal breath sounds.  ?Abdominal:  ?   Palpations: Abdomen is soft.  ?   Tenderness: There is no abdominal tenderness.  ?Musculoskeletal:     ?   General: No swelling.  ?   Cervical back: Neck supple.  ?   Right lower leg: No edema.  ?   Left lower leg: No edema.  ?Skin: ?   General: Skin is warm and dry.  ?   Capillary Refill: Capillary refill takes less than 2 seconds.  ?Neurological:  ?   Mental Status: She is alert.  ?Psychiatric:      ?   Mood and Affect: Mood normal.  ? ? ?ED Results / Procedures / Treatments   ?Labs ?(all labs ordered are listed, but only abnormal results are displayed) ?Labs Reviewed  ?BASIC METABOLIC PANEL - Abnormal; Notable for the following components:  ?    Result Value  ? Glucose, Bld 162 (*)   ? BUN 34 (*)   ? Creatinine, Ser 1.11 (*)   ? GFR, Estimated 47 (*)   ? All other components within normal limits  ?CBC - Abnormal; Notable for the following components:  ? Hemoglobin 11.9 (*)   ? All  other components within normal limits  ?BRAIN NATRIURETIC PEPTIDE  ?TROPONIN I (HIGH SENSITIVITY)  ?TROPONIN I (HIGH SENSITIVITY)  ? ? ?EKG ?EKG Interpretation ? ?Date/Time:  Tuesday February 25 2022 10:02:14 EDT ?Ventricular Rate:  69 ?PR Interval:  140 ?QRS Duration: 156 ?QT Interval:  462 ?QTC Calculation: 495 ?R Axis:   -76 ?Text Interpretation: Normal sinus rhythm Right bundle branch block Left anterior fascicular block Abnormal ECG When compared with ECG of 29-Dec-2021 20:43, PREVIOUS ECG IS PRESENT Confirmed by Regan Lemming ([id number]) on 02/25/2022 11:27:24 AM ? ?Radiology ?DG Chest 2 View ? ?Result Date: 02/25/2022 ?CLINICAL DATA:  Chest pain EXAM: CHEST - 2 VIEW COMPARISON:  12/29/2021 FINDINGS: Transverse diameter of heart is increased. There are no signs of pulmonary edema or focal pulmonary consolidation. Transverse linear density in the right parahilar region has not changed suggesting scarring or subsegmental atelectasis. There is no pleural effusion or pneumothorax. There is previous right shoulder arthroplasty. IMPRESSION: Cardiomegaly. There are no new infiltrates or signs of pulmonary edema. Electronically Signed   By: Elmer Picker M.D.   On: 02/25/2022 10:33   ? ?Procedures ?Ultrasound ED Peripheral IV (Provider) ? ?Date/Time: 02/25/2022 1:05 PM ?Performed by: Regan Lemming, MD ?Authorized by: Regan Lemming, MD  ? ?Procedure details:  ?  Indications: multiple failed IV attempts   ?  Skin Prep: chlorhexidine  gluconate   ?  Location:  Right AC ?  Angiocath:  20 G ?  Bedside Ultrasound Guided: Yes   ?  Images: not archived   ?  Patient tolerated procedure without complications: Yes   ?  Dressing applied: Yes    ? ? ?Medicati

## 2022-02-25 NOTE — ED Triage Notes (Addendum)
Patient arrives with son, who states that the patient has CHF and she was weighed at her facility [facility]) and they noted a 10 pound weight gain since last week. Patient reports some central chest discomfort, rates it a 4/10.  ? ?Son present with patient providing history.  ?

## 2022-02-25 NOTE — ED Notes (Signed)
IV attempted but unable. Provider notified and when able was going to do a ultrasound IV. ?

## 2022-02-25 NOTE — Discharge Instructions (Addendum)
You were evaluated in the Emergency Department and after careful evaluation, we did not find any emergent condition requiring admission or further testing in the hospital. ? ?Your exam/testing today was overall reassuring.  There is no evidence that your heart failure exacerbation today.  Low concern for heart attack at this time.  Symptoms have resolved.  EKG, chest x-ray and laboratory work-up all reassuring.  Stable for discharge and continued outpatient management. ? ?Please return to the Emergency Department if you experience any worsening of your condition.  Thank you for allowing [place] to be a part of your care. ? ?

## 2022-03-13 ENCOUNTER — Ambulatory Visit: Payer: Medicare HMO | Attending: Internal Medicine | Admitting: Physical Therapy

## 2022-03-13 DIAGNOSIS — R29818 Other symptoms and signs involving the nervous system: Secondary | ICD-10-CM | POA: Insufficient documentation

## 2022-03-13 DIAGNOSIS — R293 Abnormal posture: Secondary | ICD-10-CM | POA: Insufficient documentation

## 2022-03-13 DIAGNOSIS — M6281 Muscle weakness (generalized): Secondary | ICD-10-CM | POA: Diagnosis present

## 2022-03-13 DIAGNOSIS — R29898 Other symptoms and signs involving the musculoskeletal system: Secondary | ICD-10-CM | POA: Insufficient documentation

## 2022-03-13 NOTE — Therapy (Signed)
?OUTPATIENT PHYSICAL THERAPY NEURO EVALUATION ? ? ?Patient Name: Debbie Arnold ?MRN: [id number] ?DOB:1927-01-26, 88 y.o., female ?Today's Date: 03/14/2022 ? ?PCP: Debbie Hoist, MD ?REFERRING PROVIDER: Janora Norlander, MD ? ? PT End of Session - 03/14/22 0908   ? ? Visit Number 1   ? Number of Visits 17   ? Date for PT Re-Evaluation 06/12/22   ? Authorization Type UHC Medicare   ? PT Start Time 0840   ? PT Stop Time 3329   ? PT Time Calculation (min) 52 min   ? Equipment Utilized During Treatment --   Harrel Lemon  ? Activity Tolerance Patient tolerated treatment well   ? Behavior During Therapy [facility] for tasks assessed/performed   ? ?  ?  ? ?  ? ? ?Past Medical History:  ?Diagnosis Date  ? COPD (chronic obstructive pulmonary disease) ([place])   ? Diabetes mellitus without complication ([place])   ? Gout   ? HFrEF (heart failure with reduced ejection fraction) ([place])   ? 40-45%, mild MR, mod TR  ? Hypertension   ? Pulmonary embolism ([place])   ? Stage III chronic kidney disease ([place])   ? Thyroid disease   ? ?No past surgical history on file. ?Patient Active Problem List  ? Diagnosis Date Noted  ? Pulmonary embolism ([place]) 01/26/2022  ? Gout 01/26/2022  ? Stage III chronic kidney disease ([place]) 01/26/2022  ? Acute lower UTI 01/01/2022  ? Class 3 obesity ([place]) 01/01/2022  ? Depression 01/01/2022  ? Acute kidney injury superimposed on chronic kidney disease ([place]) 12/30/2021  ? Acute on chronic diastolic CHF (congestive heart failure) ([place]) 12/30/2021  ? CHF (congestive heart failure) ([place]) 12/30/2021  ? Acute kidney injury ([place]) 11/24/2017  ? Hyponatremia 11/24/2017  ? Diabetes mellitus type 2 in obese ([place]) 11/23/2017  ? Hyperlipidemia with target LDL less than 100 11/23/2017  ? Lumbar facet arthropathy 05/28/2017  ? Steroid responder, bilateral 01/12/2017  ? COPD (chronic obstructive pulmonary disease) ([place]) 08/05/2016  ? Gastroesophageal reflux disease without esophagitis 08/02/2015  ? Laryngopharyngeal reflux 08/02/2015  ? Corneal edema  07/03/2015  ? XT (exotropia) 07/03/2015  ? Anterior uveitis 06/22/2015  ? Pain of right eye 05/21/2015  ? Allergic conjunctivitis of both eyes 03/14/2014  ? Anxiety, generalized 01/04/2014  ? Pseudophakia of both eyes 04/27/2013  ? Obesity, Class III, BMI 40-49.9 (morbid obesity) ([place]) 04/13/2013  ? Peripheral neuropathy 12/14/2012  ? Diabetes ([place]) 11/10/2012  ? Primary open angle glaucoma of both eyes, mild stage 11/10/2012  ? Spinal stenosis of lumbar region with neurogenic claudication 09/08/2012  ? Low back pain 07/19/2012  ? Diabetes mellitus with stage 3 chronic kidney disease ([place]) 11/12/2010  ? HTN (hypertension) 11/29/2009  ? Benign neoplasm of skin 05/02/2009  ? Hypothyroidism 05/02/2009  ? Constipation 04/16/2009  ? Other affections of shoulder region, not elsewhere classified 05/30/2008  ? Obstructive sleep apnea syndrome 04/07/2008  ? Anemia, chronic disease 03/22/2008  ? Alopecia 02/02/2008  ? DDD (degenerative disc disease), lumbar 01/18/2008  ? Osteoarthritis of multiple joints 01/18/2008  ? ? ?ONSET DATE: 02/24/2022  ? ?REFERRING DIAG: E66.01 (ICD-10-CM) - Morbid obesity ([place]) G95.9 (ICD-10-CM) - Disease of spinal cord, unspecified R53.81 (ICD-10-CM) - Other malaise  ? ?THERAPY DIAG:  ?Abnormal posture ? ?Muscle weakness (generalized) ? ?Other symptoms and signs involving the musculoskeletal system ? ?Other symptoms and signs involving the nervous system ? ?SUBJECTIVE:  ?                                                                                                                                                                                           ? ?  SUBJECTIVE STATEMENT: ?In March 2022, she underwent bilateral cervical posterior decompression and laminectomies C3-6 to address cervical stenosis with myleopathy. Afterwards went to [facility] rehab and was receiving intensive inpatient rehab - was able to stand and take a few steps in the // bars. Then went to a SNF and a month lapsed before  she started therapy. Then afterwards went to ALF and therapy was even less. In Jan 2023 went to the hospital for a CHF exacerbation and then went back to a SNF and ALF. Was receiving some therapy at the ALF - stopped last week. Was able to stand last week with her son and a PT for about a minute - had to pull from her w/c and stand on the handrails in the hallways. Has also had days where she hasn't been able to stand. Last time she was able to take steps in the // bars was last June when she left [facility]. Using the [facility] for all her transfers now. At [facility] and the first ALF was able to transfer with a [place], but now where she lives they do not have a [place] so she has to transfer with the [facility]. Spends the majority of her day in a manual w/c.  ? ?Pt accompanied by: self and family member son - Debbie Arnold ? ?PERTINENT HISTORY: PMH: DM2, HTN, CHF, COPD, thyroid disease, obesity, HOH ? ?In March 2022, had bilateral cervical posterior decompression and laminectomies C3-6 to address cervical stenosis with myleopathy. Pt received intensive therapy afterwards for a couple months. Pt lost all of those gains while living in a skilled nursing facility. Has not ambulated since June 2022.  ? ?PAIN:  ?Are you having pain? Has some back pain, but only if you touch it.  ? ?PRECAUTIONS: Fall  ? ?FALLS: Has patient fallen in last 6 months? No ? ?LIVING ENVIRONMENT: ?Lives with: lives with their family and lives in an assisted living facility ?Lives in: Other ALF ?Stairs: No ?Has following equipment at home:  Manual w/c, [facility] bed, Hoyer lifts to the shower and sits on a shower chair ? ?PLOF: Needs assistance with ADLs and Dependent with transfer - Harrel Lemon ? ?PATIENT GOALS [name] goal is to have her to a stand pivot transfer so that he can be able to get in the car, pt's goal - wants to walk.  ? ?OBJECTIVE:  ? ? ? ?COGNITION: ?Overall cognitive status: Within functional limits for tasks  assessed ?  ?SENSATION: ?WFL ? ?COORDINATION: ?Impaired due to weakness.  ? ? ?POSTURE: rounded shoulders, forward head, increased thoracic kyphosis, and posterior pelvic tilt ? ?LE ROM:    ?Pt laying on incline on mat table; significant limited ROM in hip flexion, hamstrings, and R>L ankle DF ? ? ?MMT:   ? ?MMT Right ?03/14/2022 Left ?03/14/2022  ?Hip flexion 3-/5 3-/5  ?Hip extension    ?Hip abduction    ?Hip adduction    ?Hip internal rotation    ?Hip external rotation    ?Knee flexion 4-/5 4-/5  ?Knee extension 4-/5 4-/5  ?Ankle dorsiflexion 4-/5 4-/5  ?Ankle plantarflexion    ?Ankle inversion    ?Ankle eversion    ?(Blank rows = not tested) ? ?4/5 hip ADD/ABD (performed sitting in w/c) ? ?With bridging on mat table- pt able to activate glutes, but unable to clear buttocks from mat table.  ? ?BED MOBILITY:  ?Supine to sit Total A +2 therapists ?Pt elevated on incline  ? ?TRANSFERS: ?Dependent via Hoyer (+2 therapists needed)  from manual w/c <> mat table  ? ? ?BALANCE: ?Seated balance at EOM with feet supported and unsupported: pt needing CGA and incr posterior lean (but pt able to prevent from falling backwards), pt able to reach forwards/laterally outside of BOS with CGA. Performed seated at [facility] for ~2 minutes. ? ?TODAY'S TREATMENT:  ?N/A at eval.  ? ? ?PATIENT EDUCATION: ?Education details: Discussed use of a power chair for improved independence, mobility and positioning as pt is just in her manual w/c for the majority of the day. PT would need to get a referral from MD and an order for this. Discussed POC for 2x week and realistic/functional goals to work on in therapy - seated balance, transfers, bed mobility, and standing tolerance (that pt can perform with pt's son at ALF from w/c and use of bar in hallway).  Providing pt with a stretching/ROM and strengthening program for home.  ?Person educated: Patient and Spouse ?Education method: Explanation ?Education comprehension: verbalized understanding ? ? ?HOME  EXERCISE PROGRAM: ?Will provide at next session.  ? ? ? ?GOALS: ?Goals reviewed with patient? Yes Reviewed with pt and pt's son.  ? ?SHORT TERM GOALS: Target date: 04/11/2022 ? ?Pt and pt's son/family will be independent with initial stretch

## 2022-03-17 DIAGNOSIS — J810 Acute pulmonary edema: Secondary | ICD-10-CM

## 2022-03-18 ENCOUNTER — Ambulatory Visit: Payer: Self-pay | Admitting: Physical Therapy

## 2022-03-27 ENCOUNTER — Encounter: Payer: Self-pay | Admitting: Physical Therapy

## 2022-03-27 ENCOUNTER — Telehealth: Payer: Self-pay | Admitting: Physical Therapy

## 2022-03-27 ENCOUNTER — Ambulatory Visit: Payer: Medicare HMO | Admitting: Physical Therapy

## 2022-03-27 DIAGNOSIS — M6281 Muscle weakness (generalized): Secondary | ICD-10-CM

## 2022-03-27 DIAGNOSIS — R293 Abnormal posture: Secondary | ICD-10-CM | POA: Diagnosis not present

## 2022-03-27 DIAGNOSIS — R29898 Other symptoms and signs involving the musculoskeletal system: Secondary | ICD-10-CM

## 2022-03-27 DIAGNOSIS — R29818 Other symptoms and signs involving the nervous system: Secondary | ICD-10-CM

## 2022-03-27 NOTE — Telephone Encounter (Signed)
Dr. Steffanie Dunn, ? ?Katasha Riga was evaluated by Physical Therapy at [facility].  The patient would benefit from an OT evaluation for BUE weakness and impaired coordination. ? ?She would also benefit from a power w/c referral in order to improve functional mobility and independence as well as improved positioning. Pt is currently in a manual w/c and pt can't propel on her own. She has been nonambulatory for ~1 year.  ?   ?If you agree, please place an order in [facility] workque in [facility] or fax the order to [phone]. ?Thank you, ?Janann August, PT, DPT ?03/27/22 2:42 PM  ? ? ?[address] ?[address] ?[address] ?[place], [place]  [address] ?Phone:  [phone] ?Fax:  [phone] ? ?

## 2022-03-27 NOTE — Therapy (Addendum)
?OUTPATIENT PHYSICAL THERAPY TREATMENT NOTE ? ? ?Patient Name: Debbie Arnold ?MRN: [id number] ?DOB:18-Nov-1927, 88 y.o., female ?Today's Date: 03/27/2022 ? ?PCP: Tempie Hoist, MD ?REFERRING PROVIDER:Lambeth, Roney Marion, MD ? ?END OF SESSION:  ? PT End of Session - 03/27/22 1320   ? ? Visit Number 2   ? Number of Visits 17   ? Date for PT Re-Evaluation 06/12/22   ? Authorization Type UHC Medicare   ? PT Start Time 3710   ? PT Stop Time 1400   ? PT Time Calculation (min) 43 min   ? Equipment Utilized During Treatment --   Harrel Lemon  ? Activity Tolerance Patient tolerated treatment well   ? Behavior During Therapy [facility] for tasks assessed/performed   ? ?  ?  ? ?  ? ? ?Past Medical History:  ?Diagnosis Date  ? COPD (chronic obstructive pulmonary disease) ([place])   ? Diabetes mellitus without complication ([place])   ? Gout   ? HFrEF (heart failure with reduced ejection fraction) ([place])   ? 40-45%, mild MR, mod TR  ? Hypertension   ? Pulmonary embolism ([place])   ? Stage III chronic kidney disease ([place])   ? Thyroid disease   ? ?History reviewed. No pertinent surgical history. ?Patient Active Problem List  ? Diagnosis Date Noted  ? Acute pulmonary edema (HCC)   ? Pulmonary embolism ([place]) 01/26/2022  ? Gout 01/26/2022  ? Stage III chronic kidney disease ([place]) 01/26/2022  ? Acute lower UTI 01/01/2022  ? Class 3 obesity ([place]) 01/01/2022  ? Depression 01/01/2022  ? Acute kidney injury superimposed on chronic kidney disease ([place]) 12/30/2021  ? Acute on chronic diastolic CHF (congestive heart failure) ([place]) 12/30/2021  ? CHF (congestive heart failure) ([place]) 12/30/2021  ? Acute kidney injury ([place]) 11/24/2017  ? Hyponatremia 11/24/2017  ? Diabetes mellitus type 2 in obese ([place]) 11/23/2017  ? Hyperlipidemia with target LDL less than 100 11/23/2017  ? Lumbar facet arthropathy 05/28/2017  ? Steroid responder, bilateral 01/12/2017  ? COPD (chronic obstructive pulmonary disease) ([place]) 08/05/2016  ? Gastroesophageal reflux disease without esophagitis  08/02/2015  ? Laryngopharyngeal reflux 08/02/2015  ? Corneal edema 07/03/2015  ? XT (exotropia) 07/03/2015  ? Anterior uveitis 06/22/2015  ? Pain of right eye 05/21/2015  ? Allergic conjunctivitis of both eyes 03/14/2014  ? Anxiety, generalized 01/04/2014  ? Pseudophakia of both eyes 04/27/2013  ? Obesity, Class III, BMI 40-49.9 (morbid obesity) ([place]) 04/13/2013  ? Peripheral neuropathy 12/14/2012  ? Diabetes ([place]) 11/10/2012  ? Primary open angle glaucoma of both eyes, mild stage 11/10/2012  ? Spinal stenosis of lumbar region with neurogenic claudication 09/08/2012  ? Low back pain 07/19/2012  ? Diabetes mellitus with stage 3 chronic kidney disease ([place]) 11/12/2010  ? HTN (hypertension) 11/29/2009  ? Benign neoplasm of skin 05/02/2009  ? Hypothyroidism 05/02/2009  ? Constipation 04/16/2009  ? Other affections of shoulder region, not elsewhere classified 05/30/2008  ? Obstructive sleep apnea syndrome 04/07/2008  ? Anemia, chronic disease 03/22/2008  ? Alopecia 02/02/2008  ? DDD (degenerative disc disease), lumbar 01/18/2008  ? Osteoarthritis of multiple joints 01/18/2008  ? ? ?REFERRING DIAG: E66.01 (ICD-10-CM) - Morbid obesity ([place]) G95.9 (ICD-10-CM) - Disease of spinal cord, unspecified R53.81 (ICD-10-CM) - Other malaise  ? ?THERAPY DIAG:  ?Abnormal posture ? ?Muscle weakness (generalized) ? ?Other symptoms and signs involving the musculoskeletal system ? ?Other symptoms and signs involving the nervous system ? ?PERTINENT HISTORY: PMH: DM2, HTN, CHF, COPD, thyroid disease, obesity, HOH ?  ?In March  2022, had bilateral cervical posterior decompression and laminectomies C3-6 to address cervical stenosis with myleopathy. Pt received intensive therapy afterwards for a couple months. Pt lost all of those gains while living in a skilled nursing facility. Has not ambulated since June 2022.  ? ?PRECAUTIONS: Fall ? ?SUBJECTIVE: feels like she has gained some weight.  ? ?Pt accompanied by: self and family member son -  [name] ? ?PAIN:  ?Are you having pain? Yes: NPRS scale: 5/10 ?Pain location: bilat hands ?Pain description: Sore ?Aggravating factors: Nothing ?Relieving factors: Lotion ? ? ?OBJECTIVE:  ?  ?  ?  ?TRANSFERS: ?Dependent via Hoyer (+2 therapists needed) from manual w/c <> mat table  ?Had patient lay supine on blue incline with 2 pillows during supine stretches/strengthening exercises.  ?  ? ?  ?TODAY'S TREATMENT:  ?Initiated HEP for stretching/strengthening in supine/seated. [name] there to be educated on how to perform.  ?  ? Access Code: [email or web address] ?URL: [email or web address] ?Date: 03/27/2022 ?Prepared by: Janann August ? ?Exercises ?- Seated Knee Extension with Resistance  - 1 x daily - 7 x weekly - 3 sets - 10 reps ?- Seated Hamstring Stretch  - 1 x daily - 7 x weekly - 3 sets - 10 reps ?- Supine Hip and Knee Flexion PROM with Caregiver  - 1 x daily - 7 x weekly - 3 sets - 10 reps ?- Supine Hamstring Stretch with Caregiver  - 1 x daily - 7 x weekly - 3 sets - 10 reps ?- Supine Ankle Dorsiflexion Stretch with Caregiver  - 1 x daily - 7 x weekly - 3 sets - 10 reps ?- Supine March  - 1 x daily - 7 x weekly - 3 sets - 10 reps ?- Hooklying Clamshell with Resistance  - 1 x daily - 7 x weekly - 3 sets - 10 reps ?- Supine glute squeeze  - 1 x daily - 7 x weekly - 3 sets - 10 reps ? ?See MedBridge for more details.  ? ?PATIENT EDUCATION: ?Education details: Initial HEP   ?Person educated: Patient and pt's son ?Education method: Explanation and Handout ?Education comprehension: verbalized understanding ?  ?  ?HOME EXERCISE PROGRAM: ?[email or web address] ?  ?  ?  ?GOALS: ?Goals reviewed with patient? Yes Reviewed with pt and pt's son.  ?  ?SHORT TERM GOALS: Target date: 04/11/2022 ?  ?Pt and pt's son/family will be independent with initial stretching/strengthening and ROM HEP. ?Baseline: ?Goal status: INITIAL ?  ?2.  Pt will tolerate seated balance activities at edge of mat for 4-5 minutes with supervision for improved  activity tolerance for ADLs. ?Baseline: CGA- approx. 2 minutes at eval.  ?Goal status: INITIAL ?  ?3.  Pt will perform sliding board transfer vs. Squat pivot with max A from w/c <> mat table in order to demo improved functional transfers.  ?Baseline: Currently uses Hoyer. ?Goal status: INITIAL ?  ?  ?LONG TERM GOALS: Target date: 04/25/2022 ?  ?Pt and pt's son/family will be independent with final stretching/strengthening and ROM HEP. ?Baseline:  ?Goal status: INITIAL ?  ?2.  Pt will perform sliding board transfer vs. Squat pivot with mod A from w/c <> mat table in order to demo improved functional transfers.  ?Baseline:  ?Goal status: INITIAL ?  ?3.  Pt will perform rolling R and L with max A in order to demo improved bed mobility.  ?Baseline: Rolling not assessed at eval.  ?Goal status: INITIAL ?  ?4.  Pt will tolerate seated balance  activities at edge of mat for 6 minutes with mod I for improved activity tolerance for ADLs. ?Baseline:  ?Goal status: INITIAL ?  ?5.  Pt will perform standing in // bars vs. Sink with mod A x1 therapist for at least 2 minutes in order to demo improved standing tolerance/weightbearing at ALF that pt can perform with her son. ?Baseline: Did not assess at eval, pt's son reports she is standing at ALF with +2 assist.  ?Goal status: INITIAL ?  ?ASSESSMENT: ?  ?CLINICAL IMPRESSION: ?Today's skilled session focused on initiating HEP for supine and seated strengthening and ROM for pt to perform with her son. [name] verbalized understanding of instructions. Pt tolerated session well, will continue to progress towards LTGs.  ?  ?  ?OBJECTIVE IMPAIRMENTS decreased activity tolerance, decreased balance, decreased coordination, decreased endurance, decreased mobility, difficulty walking, decreased ROM, decreased strength, hypomobility, increased fascial restrictions, increased muscle spasms, impaired flexibility, impaired UE functional use, postural dysfunction, obesity, and pain.  ?  ?ACTIVITY  LIMITATIONS meal prep and bathing, dressing, ADLs .  ?  ?PERSONAL FACTORS Age, Behavior pattern, Past/current experiences, Time since onset of injury/illness/exacerbation, and 3+ comorbidities: DM2, HTN, CHF, CO

## 2022-04-01 ENCOUNTER — Ambulatory Visit: Payer: Medicare HMO | Admitting: Physical Therapy

## 2022-04-01 ENCOUNTER — Encounter: Payer: Self-pay | Admitting: Physical Therapy

## 2022-04-01 DIAGNOSIS — R29898 Other symptoms and signs involving the musculoskeletal system: Secondary | ICD-10-CM

## 2022-04-01 DIAGNOSIS — R293 Abnormal posture: Secondary | ICD-10-CM

## 2022-04-01 DIAGNOSIS — M6281 Muscle weakness (generalized): Secondary | ICD-10-CM

## 2022-04-01 DIAGNOSIS — R29818 Other symptoms and signs involving the nervous system: Secondary | ICD-10-CM

## 2022-04-01 NOTE — Therapy (Signed)
?OUTPATIENT PHYSICAL THERAPY TREATMENT NOTE ? ? ?Patient Name: Debbie Arnold ?MRN: [id number] ?DOB:[birth year], 88 y.o., female ?Today's Date: 04/01/2022 ? ?PCP: Tempie Hoist, MD ?REFERRING PROVIDER:Lambeth, Roney Marion, MD ? ?END OF SESSION:  ? PT End of Session - 04/01/22 1452   ? ? Visit Number 3   ? Number of Visits 17   ? Date for PT Re-Evaluation 06/12/22   ? Authorization Type [employer] Medicare   ? PT Start Time 1448   ? PT Stop Time 1530   ? PT Time Calculation (min) 42 min   ? Equipment Utilized During Treatment --   Harrel Lemon  ? Activity Tolerance Patient tolerated treatment well   ? Behavior During Therapy [facility] for tasks assessed/performed   ? ?  ?  ? ?  ? ? ?Past Medical History:  ?Diagnosis Date  ? COPD (chronic obstructive pulmonary disease) ([place])   ? Diabetes mellitus without complication ([place])   ? Gout   ? HFrEF (heart failure with reduced ejection fraction) ([place])   ? 40-45%, mild MR, mod TR  ? Hypertension   ? Pulmonary embolism ([place])   ? Stage III chronic kidney disease ([place])   ? Thyroid disease   ? ?History reviewed. No pertinent surgical history. ?Patient Active Problem List  ? Diagnosis Date Noted  ? Acute pulmonary edema (HCC)   ? Pulmonary embolism ([place]) 01/26/2022  ? Gout 01/26/2022  ? Stage III chronic kidney disease ([place]) 01/26/2022  ? Acute lower UTI 01/01/2022  ? Class 3 obesity ([place]) 01/01/2022  ? Depression 01/01/2022  ? Acute kidney injury superimposed on chronic kidney disease ([place]) 12/30/2021  ? Acute on chronic diastolic CHF (congestive heart failure) ([place]) 12/30/2021  ? CHF (congestive heart failure) ([place]) 12/30/2021  ? Acute kidney injury ([place]) 11/24/2017  ? Hyponatremia 11/24/2017  ? Diabetes mellitus type 2 in obese ([place]) 11/23/2017  ? Hyperlipidemia with target LDL less than 100 11/23/2017  ? Lumbar facet arthropathy 05/28/2017  ? Steroid responder, bilateral 01/12/2017  ? COPD (chronic obstructive pulmonary disease) ([place]) 08/05/2016  ? Gastroesophageal reflux disease without esophagitis  08/02/2015  ? Laryngopharyngeal reflux 08/02/2015  ? Corneal edema 07/03/2015  ? XT (exotropia) 07/03/2015  ? Anterior uveitis 06/22/2015  ? Pain of right eye 05/21/2015  ? Allergic conjunctivitis of both eyes 03/14/2014  ? Anxiety, generalized 01/04/2014  ? Pseudophakia of both eyes 04/27/2013  ? Obesity, Class III, BMI 40-49.9 (morbid obesity) ([place]) 04/13/2013  ? Peripheral neuropathy 12/14/2012  ? Diabetes ([place]) 11/10/2012  ? Primary open angle glaucoma of both eyes, mild stage 11/10/2012  ? Spinal stenosis of lumbar region with neurogenic claudication 09/08/2012  ? Low back pain 07/19/2012  ? Diabetes mellitus with stage 3 chronic kidney disease ([place]) 11/12/2010  ? HTN (hypertension) 11/29/2009  ? Benign neoplasm of skin 05/02/2009  ? Hypothyroidism 05/02/2009  ? Constipation 04/16/2009  ? Other affections of shoulder region, not elsewhere classified 05/30/2008  ? Obstructive sleep apnea syndrome 04/07/2008  ? Anemia, chronic disease 03/22/2008  ? Alopecia 02/02/2008  ? DDD (degenerative disc disease), lumbar 01/18/2008  ? Osteoarthritis of multiple joints 01/18/2008  ? ? ?REFERRING DIAG: E66.01 (ICD-10-CM) - Morbid obesity ([place]) G95.9 (ICD-10-CM) - Disease of spinal cord, unspecified R53.81 (ICD-10-CM) - Other malaise  ? ?THERAPY DIAG:  ?Abnormal posture ? ?Muscle weakness (generalized) ? ?Other symptoms and signs involving the musculoskeletal system ? ?Other symptoms and signs involving the nervous system ? ?PERTINENT HISTORY: PMH: DM2, HTN, CHF, COPD, thyroid disease, obesity, HOH ?  ?In March  2022, had bilateral cervical posterior decompression and laminectomies C3-6 to address cervical stenosis with myleopathy. Pt received intensive therapy afterwards for a couple months. Pt lost all of those gains while living in a skilled nursing facility. Has not ambulated since June 2022.  ? ?PRECAUTIONS: Fall ? ?SUBJECTIVE: Has been trying to do the exercises at home. Accidentally spilt water on her pants in the  waiting room.  ? ?Pt accompanied by: self and family member daughter Neoma Laming  ? ?PAIN:  ?Are you having pain? Yes: NPRS scale: 5/10 ?Pain location: bilat hands ?Pain description: Sore ?Aggravating factors: Nothing ?Relieving factors: Lotion ? ?TODAY'S TREATMENT:  ?  ?TRANSFERS: ?Dependent via Hoyer (+2 therapists needed) from manual w/c <> mat table.  ?Had patient lay supine on blue incline with 2 pillows, then pt needing total A +2 to come from supine > sitting EOB. Had one therapist at pt's trunk and 2nd therapist at pt's BLE. Needing max A to scoot back on mat table, pt able to lean towards one side, but therapist needing to assist with scooting and bringing hips back.  ? ? ? ?Pt seated at EOB with one PT in front of pt and 2nd PT behind pt's trunk providing CGA/min A for balance as needed. Aerobic step under pt's foot. ?2 sets of 5 reps having pt reach BUE anteriorly towards therapists hands as a target for incr forward lean and trunk control/core activation and then coming back to midline before reaching again ?5 reps each side lateral weight shifting through hips with reaching outside of BOS to tap cone, incr difficulty with weight shifting to R. Verbal/tactile cues for weight shift and weight bearing through legs esp LLE.  ?5 reps each side without UE support marching leg and weight bearing through contralateral leg, pt with incr difficulty with lifting up RLE with minimal ROM. With incr reps, pt with incr posterior lean.  ? ?Pt fatigued easily with seated exercises and needing intermittent seated rest breaks (requiring max A from therapist posterior to patient), when pt fatigued pt with incr posterior trunk lean and to the R.  ?Total time spent seated at edge of the mat: ~20 minutes.  ? ?At end of session after pt back in manual w/c, needing total A +2 therapists to get pt properly scooted back into w/c.  ?   ? ?PATIENT EDUCATION: ?Education details: PT sent order request to pt's MD regarding power w/c  referral and OT. Continue with HEP.  ?Person educated: Patient and pt's daughter ?Education method: Explanation and Handout ?Education comprehension: verbalized understanding ?  ?  ?HOME EXERCISE PROGRAM: ?[id number] ?  ?  ?  ?GOALS: ?Goals reviewed with patient? Yes Reviewed with pt and pt's son.  ?  ?SHORT TERM GOALS: Target date: 04/11/2022 ?  ?Pt and pt's son/family will be independent with initial stretching/strengthening and ROM HEP. ?Baseline: ?Goal status: INITIAL ?  ?2.  Pt will tolerate seated balance activities at edge of mat for 4-5 minutes with supervision for improved activity tolerance for ADLs. ?Baseline: CGA- approx. 2 minutes at eval.  ?Goal status: INITIAL ?  ?3.  Pt will perform sliding board transfer vs. Squat pivot with max A from w/c <> mat table in order to demo improved functional transfers.  ?Baseline: Currently uses Hoyer. ?Goal status: INITIAL ?  ?  ?LONG TERM GOALS: Target date: 04/25/2022 ?  ?Pt and pt's son/family will be independent with final stretching/strengthening and ROM HEP. ?Baseline:  ?Goal status: INITIAL ?  ?2.  Pt will  perform sliding board transfer vs. Squat pivot with mod A from w/c <> mat table in order to demo improved functional transfers.  ?Baseline:  ?Goal status: INITIAL ?  ?3.  Pt will perform rolling R and L with max A in order to demo improved bed mobility.  ?Baseline: Rolling not assessed at eval.  ?Goal status: INITIAL ?  ?4.  Pt will tolerate seated balance activities at edge of mat for 6 minutes with mod I for improved activity tolerance for ADLs. ?Baseline:  ?Goal status: INITIAL ?  ?5.  Pt will perform standing in // bars vs. Sink with mod A x1 therapist for at least 2 minutes in order to demo improved standing tolerance/weightbearing at ALF that pt can perform with her son. ?Baseline: Did not assess at eval, pt's son reports she is standing at ALF with +2 assist.  ?Goal status: INITIAL ?  ?ASSESSMENT: ?  ?CLINICAL IMPRESSION: ?Worked on seated balance at edge  of mat today for sitting tolerance, trunk/core activation and postural control. Pt with difficulty with exercises for incr forward lean and fatigues easily. Spent approx. 20 minutes at edge of mat, 2nd therapist nee

## 2022-04-08 ENCOUNTER — Ambulatory Visit: Payer: Medicare HMO | Admitting: Physical Therapy

## 2022-04-10 ENCOUNTER — Ambulatory Visit: Payer: Medicare HMO | Admitting: Physical Therapy

## 2022-04-15 ENCOUNTER — Ambulatory Visit: Payer: Medicare Other | Admitting: Physical Therapy

## 2022-04-22 ENCOUNTER — Ambulatory Visit: Payer: Medicare Other | Admitting: Physical Therapy

## 2022-04-24 ENCOUNTER — Encounter: Payer: Self-pay | Admitting: Occupational Therapy

## 2022-04-24 ENCOUNTER — Ambulatory Visit: Payer: Medicare Other | Admitting: Physical Therapy

## 2022-04-29 ENCOUNTER — Ambulatory Visit: Payer: Medicare HMO | Attending: Internal Medicine | Admitting: Physical Therapy

## 2022-05-01 ENCOUNTER — Ambulatory Visit: Payer: Medicare HMO | Admitting: Physical Therapy

## 2022-05-06 ENCOUNTER — Ambulatory Visit: Payer: Medicare Other | Admitting: Physical Therapy

## 2022-05-08 ENCOUNTER — Ambulatory Visit: Payer: Medicare Other | Admitting: Physical Therapy

## 2022-05-13 ENCOUNTER — Ambulatory Visit: Payer: Medicare Other | Admitting: Physical Therapy

## 2022-05-15 ENCOUNTER — Ambulatory Visit: Payer: Medicare Other

## 2022-05-27 ENCOUNTER — Encounter (HOSPITAL_BASED_OUTPATIENT_CLINIC_OR_DEPARTMENT_OTHER): Payer: Medicare HMO | Attending: Internal Medicine | Admitting: Internal Medicine

## 2022-05-27 DIAGNOSIS — Z7401 Bed confinement status: Secondary | ICD-10-CM | POA: Diagnosis not present

## 2022-05-27 DIAGNOSIS — R32 Unspecified urinary incontinence: Secondary | ICD-10-CM | POA: Insufficient documentation

## 2022-05-27 DIAGNOSIS — M4802 Spinal stenosis, cervical region: Secondary | ICD-10-CM | POA: Diagnosis not present

## 2022-05-27 DIAGNOSIS — N183 Chronic kidney disease, stage 3 unspecified: Secondary | ICD-10-CM | POA: Insufficient documentation

## 2022-05-27 DIAGNOSIS — L89152 Pressure ulcer of sacral region, stage 2: Secondary | ICD-10-CM | POA: Insufficient documentation

## 2022-05-27 DIAGNOSIS — E11622 Type 2 diabetes mellitus with other skin ulcer: Secondary | ICD-10-CM

## 2022-05-27 DIAGNOSIS — S31819A Unspecified open wound of right buttock, initial encounter: Secondary | ICD-10-CM | POA: Diagnosis not present

## 2022-05-27 DIAGNOSIS — G992 Myelopathy in diseases classified elsewhere: Secondary | ICD-10-CM | POA: Insufficient documentation

## 2022-05-27 DIAGNOSIS — M48062 Spinal stenosis, lumbar region with neurogenic claudication: Secondary | ICD-10-CM | POA: Insufficient documentation

## 2022-05-27 DIAGNOSIS — I130 Hypertensive heart and chronic kidney disease with heart failure and stage 1 through stage 4 chronic kidney disease, or unspecified chronic kidney disease: Secondary | ICD-10-CM | POA: Insufficient documentation

## 2022-05-27 DIAGNOSIS — I509 Heart failure, unspecified: Secondary | ICD-10-CM | POA: Diagnosis not present

## 2022-05-27 DIAGNOSIS — J449 Chronic obstructive pulmonary disease, unspecified: Secondary | ICD-10-CM | POA: Diagnosis not present

## 2022-05-27 DIAGNOSIS — Z993 Dependence on wheelchair: Secondary | ICD-10-CM | POA: Diagnosis not present

## 2022-05-27 DIAGNOSIS — E1122 Type 2 diabetes mellitus with diabetic chronic kidney disease: Secondary | ICD-10-CM | POA: Diagnosis not present

## 2022-05-28 NOTE — Progress Notes (Addendum)
BIRDIA, JAYCOX ([id number]) Visit Report for 05/27/2022 Allergy List Details Patient Name: Date of Service: ROLONDA, PONTARELLI. 05/27/2022 8:00 A M Medical Record Number: [id number] Patient Account Number: [id number] Date of Birth/Sex: Treating RN: Apr 30, 1927 (88 y.o. Debby Bud Primary Care Letesha Klecker: RA GO NESI, PETER Other Clinician: Referring Tayvien Kane: Treating Esley Brooking/Extender: Kalman Shan RA GO NESI, PETER Weeks in Treatment: 0 Allergies Active Allergies brimonidine Reaction: itching celecoxib red dye codeine Reaction: anxiety Allergy Notes Electronic Signature(s) Signed: 05/27/2022 5:08:20 PM By: Deon Pilling RN, BSN Entered By: Deon Pilling on 05/26/2022 17:15:30 -------------------------------------------------------------------------------- Arrival Information Details Patient Name: Date of Service: Charolett Bumpers, Barron Schmid. 05/27/2022 8:00 A M Medical Record Number: [id number] Patient Account Number: [id number] Date of Birth/Sex: Treating RN: January 28, 1927 (88 y.o. Tonita Phoenix, Lauren Primary Care Darcus Edds: RA GO NESI, PETER Other Clinician: Referring Marx Doig: Treating Kamrin Sibley/Extender: Kalman Shan RA GO NESI, PETER Weeks in Treatment: 0 Visit Information Patient Arrived: Wheel Chair Arrival Time: 08:18 Accompanied By: son Transfer Assistance: Civil Service fast streamer Patient Identification Verified: Yes Secondary Verification Process Completed: Yes Patient Requires Transmission-Based Precautions: No Patient Has Alerts: No Electronic Signature(s) Signed: 05/28/2022 4:06:51 PM By: Rhae Hammock RN Entered By: Rhae Hammock on 05/27/2022 08:18:38 -------------------------------------------------------------------------------- Clinic Level of Care Assessment Details Patient Name: Date of Service: LARIYA, KINZIE. 05/27/2022 8:00 A M Medical Record Number: [id number] Patient Account Number: [id number] Date of Birth/Sex: Treating RN: 10/15/1927 (88 y.o. Tonita Phoenix,  Lauren Primary Care Aroura Vasudevan: RA GO NESI, PETER Other Clinician: Referring Oryan Winterton: Treating Roda Lauture/Extender: Kalman Shan RA GO NESI, PETER Weeks in Treatment: 0 Clinic Level of Care Assessment Items TOOL 4 Quantity Score X- 1 0 Use when only an EandM is performed on FOLLOW-UP visit ASSESSMENTS - Nursing Assessment / Reassessment X- 1 10 Reassessment of Co-morbidities (includes updates in patient status) X- 1 5 Reassessment of Adherence to Treatment Plan ASSESSMENTS - Wound and Skin A ssessment / Reassessment '[]'$  - 0 Simple Wound Assessment / Reassessment - one wound X- 2 5 Complex Wound Assessment / Reassessment - multiple wounds '[]'$  - 0 Dermatologic / Skin Assessment (not related to wound area) ASSESSMENTS - Focused Assessment '[]'$  - 0 Circumferential Edema Measurements - multi extremities '[]'$  - 0 Nutritional Assessment / Counseling / Intervention '[]'$  - 0 Lower Extremity Assessment (monofilament, tuning fork, pulses) '[]'$  - 0 Peripheral Arterial Disease Assessment (using hand held doppler) ASSESSMENTS - Ostomy and/or Continence Assessment and Care '[]'$  - 0 Incontinence Assessment and Management '[]'$  - 0 Ostomy Care Assessment and Management (repouching, etc.) PROCESS - Coordination of Care '[]'$  - 0 Simple Patient / Family Education for ongoing care X- 1 20 Complex (extensive) Patient / Family Education for ongoing care X- 1 10 Staff obtains Programmer, systems, Records, T Results / Process Orders est X- 1 10 Staff telephones HHA, Nursing Homes / Clarify orders / etc '[]'$  - 0 Routine Transfer to another Facility (non-emergent condition) '[]'$  - 0 Routine Hospital Admission (non-emergent condition) '[]'$  - 0 New Admissions / Biomedical engineer / Ordering NPWT Apligraf, etc. , '[]'$  - 0 Emergency Hospital Admission (emergent condition) '[]'$  - 0 Simple Discharge Coordination X- 1 15 Complex (extensive) Discharge Coordination PROCESS - Special Needs '[]'$  - 0 Pediatric / Minor Patient  Management '[]'$  - 0 Isolation Patient Management '[]'$  - 0 Hearing / Language / Visual special needs '[]'$  - 0 Assessment of Community assistance (transportation, D/C planning, etc.) '[]'$  - 0 Additional assistance / Altered mentation '[]'$  - 0 Support Surface(s) Assessment (bed, cushion, seat, etc.) INTERVENTIONS - Wound Cleansing /  Measurement '[]'$  - 0 Simple Wound Cleansing - one wound X- 2 5 Complex Wound Cleansing - multiple wounds X- 1 5 Wound Imaging (photographs - any number of wounds) '[]'$  - 0 Wound Tracing (instead of photographs) '[]'$  - 0 Simple Wound Measurement - one wound X- 2 5 Complex Wound Measurement - multiple wounds INTERVENTIONS - Wound Dressings '[]'$  - 0 Small Wound Dressing one or multiple wounds X- 2 15 Medium Wound Dressing one or multiple wounds '[]'$  - 0 Large Wound Dressing one or multiple wounds X- 1 5 Application of Medications - topical '[]'$  - 0 Application of Medications - injection INTERVENTIONS - Miscellaneous '[]'$  - 0 External ear exam '[]'$  - 0 Specimen Collection (cultures, biopsies, blood, body fluids, etc.) '[]'$  - 0 Specimen(s) / Culture(s) sent or taken to Lab for analysis X- 1 10 Patient Transfer (multiple staff / Civil Service fast streamer / Similar devices) '[]'$  - 0 Simple Staple / Suture removal (25 or less) '[]'$  - 0 Complex Staple / Suture removal (26 or more) '[]'$  - 0 Hypo / Hyperglycemic Management (close monitor of Blood Glucose) '[]'$  - 0 Ankle / Brachial Index (ABI) - do not check if billed separately X- 1 5 Vital Signs Has the patient been seen at the hospital within the last three years: Yes Total Score: 155 Level Of Care: New/Established - Level 4 Electronic Signature(s) Signed: 05/28/2022 4:06:51 PM By: Rhae Hammock RN Entered By: Rhae Hammock on 05/27/2022 09:48:10 -------------------------------------------------------------------------------- Encounter Discharge Information Details Patient Name: Date of Service: Charolett Bumpers, Trenisha M. 05/27/2022 8:00 A  M Medical Record Number: [id number] Patient Account Number: [id number] Date of Birth/Sex: Treating RN: [birth year] (88 y.o. Tonita Phoenix, Lauren Primary Care Vee Bahe: RA GO NESI, PETER Other Clinician: Referring Margy Sumler: Treating Danton Palmateer/Extender: Kalman Shan RA GO NESI, PETER Weeks in Treatment: 0 Encounter Discharge Information Items Discharge Condition: Stable Ambulatory Status: Wheelchair Discharge Destination: Home Transportation: Private Auto Accompanied By: son Schedule Follow-up Appointment: Yes Clinical Summary of Care: Patient Declined Electronic Signature(s) Signed: 05/28/2022 4:06:51 PM By: Rhae Hammock RN Entered By: Rhae Hammock on 05/27/2022 09:48:54 -------------------------------------------------------------------------------- Lower Extremity Assessment Details Patient Name: Date of Service: Rogue Jury. 05/27/2022 8:00 A M Medical Record Number: [id number] Patient Account Number: [id number] Date of Birth/Sex: Treating RN: 1927-08-28 (88 y.o. Benjaman Lobe Primary Care Marra Fraga: RA GO NESI, PETER Other Clinician: Referring Kanai Berrios: Treating Taren Toops/Extender: Kalman Shan RA GO NESI, PETER Weeks in Treatment: 0 Electronic Signature(s) Signed: 05/28/2022 4:06:51 PM By: Rhae Hammock RN Entered By: Rhae Hammock on 05/27/2022 08:27:15 -------------------------------------------------------------------------------- Multi Wound Chart Details Patient Name: Date of Service: Charolett Bumpers, Barron Schmid. 05/27/2022 8:00 A M Medical Record Number: [id number] Patient Account Number: [id number] Date of Birth/Sex: Treating RN: 22-May-1927 (88 y.o. Tonita Phoenix, Lauren Primary Care Kasidee Voisin: RA GO NESI, PETER Other Clinician: Referring Alec Mcphee: Treating Joanie Duprey/Extender: Kalman Shan RA GO NESI, PETER Weeks in Treatment: 0 Vital Signs Height(in): 65 Pulse(bpm): 112 Weight(lbs): Blood Pressure(mmHg): 162/77 Body Mass  Index(BMI): Temperature(F): 98.3 Respiratory Rate(breaths/min): 17 Photos: [N/A:N/A] Sacrum Gluteus N/A Wound Location: Pressure Injury Gradually Appeared N/A Wounding Event: Pressure Ulcer MASD N/A Primary Etiology: Chronic Obstructive Pulmonary Chronic Obstructive Pulmonary N/A Comorbid History: Disease (COPD), Sleep Apnea, Disease (COPD), Sleep Apnea, Congestive Heart Failure, Congestive Heart Failure, Hypertension, Type II Diabetes, Gout, Hypertension, Type II Diabetes, Gout, Osteoarthritis, Neuropathy Osteoarthritis, Neuropathy 01/27/2022 01/27/2022 N/A Date Acquired: 0 0 N/A Weeks of Treatment: Open Open N/A Wound Status: No No N/A Wound Recurrence: 2x0.2x0.2 5.5x15x0.1 N/A Measurements L x W x D (cm) 0.314 64.795  N/A A (cm) : rea 0.063 6.48 N/A Volume (cm) : 0.00% 0.00% N/A % Reduction in Area: 0.00% 0.00% N/A % Reduction in Volume: Category/Stage II Full Thickness With Exposed Support N/A Classification: Structures Medium Medium N/A Exudate Amount: Serosanguineous Serosanguineous N/A Exudate Type: red, brown red, brown N/A Exudate Color: Distinct, outline attached Distinct, outline attached N/A Wound Margin: Large (67-100%) Large (67-100%) N/A Granulation Amount: Red, Pink Red, Pink N/A Granulation Quality: N/A Small (1-33%) N/A Necrotic Amount: Fat Layer (Subcutaneous Tissue): Yes Fat Layer (Subcutaneous Tissue): Yes N/A Exposed Structures: Fascia: No Fascia: No Tendon: No Tendon: No Muscle: No Muscle: No Joint: No Joint: No Bone: No Bone: No None None N/A Epithelialization: Treatment Notes Wound #1 (Sacrum) Cleanser Vashe 5.8 (oz) Discharge Instruction: Cleanse the wound with Vashe prior to applying a clean dressing using gauze sponges, not tissue or cotton balls. Peri-Wound Care Zinc Oxide Ointment 30g tube Discharge Instruction: Apply Zinc Oxide to periwound with each dressing change Topical Primary Dressing MediHoney Calcium  Alginate Dressing 4x5 in Discharge Instruction: Apply to wound bed as instructed Secondary Dressing ABD Pad, 5x9 Discharge Instruction: Apply over primary dressing as directed. Secured With Compression Wrap Compression Stockings Add-Ons Wound #2 (Gluteus) Cleanser Vashe 5.8 (oz) Discharge Instruction: Cleanse the wound with Vashe prior to applying a clean dressing using gauze sponges, not tissue or cotton balls. Peri-Wound Care Zinc Oxide Ointment 30g tube Discharge Instruction: Apply Zinc Oxide to periwound with each dressing change Topical Primary Dressing MediHoney Calcium Alginate Dressing 4x5 in Discharge Instruction: Apply to wound bed as instructed Secondary Dressing ABD Pad, 5x9 Discharge Instruction: Apply over primary dressing as directed. Secured With Compression Wrap Compression Stockings Environmental education officer) Signed: 05/27/2022 5:08:18 PM By: Kalman Shan DO Signed: 05/28/2022 4:06:51 PM By: Rhae Hammock RN Entered By: Kalman Shan on 05/27/2022 10:18:57 -------------------------------------------------------------------------------- [place] Details Patient Name: Date of Service: Charolett Bumpers, Barron Schmid. 05/27/2022 8:00 A M Medical Record Number: [id number] Patient Account Number: [id number] Date of Birth/Sex: Treating RN: 04/26/1927 (88 y.o. Benjaman Lobe Primary Care Kayven Aldaco: RA GO NESI, PETER Other Clinician: Referring Kolbie Lepkowski: Treating Chinmay Squier/Extender: Kalman Shan RA GO NESI, PETER Weeks in Treatment: 0 Active Inactive Electronic Signature(s) Signed: 06/27/2022 3:43:30 PM By: Deon Pilling RN, BSN Signed: 07/11/2022 2:04:28 PM By: Rhae Hammock RN Previous Signature: 05/28/2022 4:06:51 PM Version By: Rhae Hammock RN Entered By: Deon Pilling on 06/27/2022 15:43:30 -------------------------------------------------------------------------------- Pain Assessment Details Patient Name: Date of  Service: Rogue Jury. 05/27/2022 8:00 A M Medical Record Number: [id number] Patient Account Number: [id number] Date of Birth/Sex: Treating RN: 06/17/1927 (88 y.o. Tonita Phoenix, Lauren Primary Care Klaryssa Fauth: RA GO NESI, PETER Other Clinician: Referring Aeryn Medici: Treating Jonanthony Nahar/Extender: Kalman Shan RA GO NESI, PETER Weeks in Treatment: 0 Active Problems Location of Pain Severity and Description of Pain Patient Has Paino Yes Site Locations Pain Location: Pain in Ulcers With Dressing Change: Yes Duration of the Pain. Constant / Intermittento Intermittent Rate the pain. Current Pain Level: 8 Worst Pain Level: 10 Least Pain Level: 0 Tolerable Pain Level: 8 Character of Pain Describe the Pain: Aching Pain Management and Medication Current Pain Management: Medication: No Cold Application: No Rest: No Massage: No Activity: No T.E.N.S.: No Heat Application: No Leg drop or elevation: No Is the Current Pain Management Adequate: Adequate How does your wound impact your activities of daily livingo Sleep: No Bathing: No Appetite: No Relationship With Others: No Bladder Continence: No Emotions: No Bowel Continence: No Work: No Toileting: No Drive: No Dressing: No  Hobbies: No Electronic Signature(s) Signed: 05/28/2022 4:06:51 PM By: Rhae Hammock RN Entered By: Rhae Hammock on 05/27/2022 08:18:19 -------------------------------------------------------------------------------- Patient/Caregiver Education Details Patient Name: Date of Service: LO WE, Kirin M. 6/20/2023andnbsp8:00 A M Medical Record Number: [id number] Patient Account Number: [id number] Date of Birth/Gender: Treating RN: 01/06/1927 (88 y.o. Tonita Phoenix, Lauren Primary Care Physician: RA GO NESI, PETER Other Clinician: Referring Physician: Treating Physician/Extender: Kalman Shan RA GO NESI, PETER Weeks in Treatment: 0 Education Assessment Education Provided  To: Patient Education Topics Provided Welcome T [facility] [place]: o Methods: Explain/Verbal Responses: Reinforcements needed, State content correctly Electronic Signature(s) Signed: 05/28/2022 4:06:51 PM By: Rhae Hammock RN Entered By: Rhae Hammock on 05/27/2022 09:12:01 -------------------------------------------------------------------------------- Wound Assessment Details Patient Name: Date of Service: Rogue Jury. 05/27/2022 8:00 A M Medical Record Number: [id number] Patient Account Number: [id number] Date of Birth/Sex: Treating RN: 1927-10-05 (88 y.o. Tonita Phoenix, Lauren Primary Care Ellen Goris: RA GO NESI, PETER Other Clinician: Referring Delorean Knutzen: Treating Jayanth Szczesniak/Extender: Kalman Shan RA GO NESI, PETER Weeks in Treatment: 0 Wound Status Wound Number: 1 Primary Pressure Ulcer Etiology: Wound Location: Sacrum Wound Open Wounding Event: Pressure Injury Status: Date Acquired: 01/27/2022 Comorbid Chronic Obstructive Pulmonary Disease (COPD), Sleep Apnea, Weeks Of Treatment: 0 History: Congestive Heart Failure, Hypertension, Type II Diabetes, Gout, Clustered Wound: No Osteoarthritis, Neuropathy Photos Wound Measurements Length: (cm) 2 Width: (cm) 0.2 Depth: (cm) 0.2 Area: (cm) 0.314 Volume: (cm) 0.063 % Reduction in Area: 0% % Reduction in Volume: 0% Epithelialization: None Tunneling: No Undermining: No Wound Description Classification: Category/Stage II Wound Margin: Distinct, outline attached Exudate Amount: Medium Exudate Type: Serosanguineous Exudate Color: red, brown Foul Odor After Cleansing: No Slough/Fibrino No Wound Bed Granulation Amount: Large (67-100%) Exposed Structure Granulation Quality: Red, Pink Fascia Exposed: No Fat Layer (Subcutaneous Tissue) Exposed: Yes Tendon Exposed: No Muscle Exposed: No Joint Exposed: No Bone Exposed: No Electronic Signature(s) Signed: 05/27/2022 5:08:20 PM By: Deon Pilling RN,  BSN Signed: 05/28/2022 4:06:51 PM By: Rhae Hammock RN Entered By: Deon Pilling on 05/27/2022 08:45:33 -------------------------------------------------------------------------------- Wound Assessment Details Patient Name: Date of Service: Charolett Bumpers, Barron Schmid. 05/27/2022 8:00 A M Medical Record Number: [id number] Patient Account Number: [id number] Date of Birth/Sex: Treating RN: Dec 06, 1927 (88 y.o. Tonita Phoenix, Lauren Primary Care Delora Gravatt: RA GO NESI, PETER Other Clinician: Referring Denean Pavon: Treating Raunel Dimartino/Extender: Kalman Shan RA GO NESI, PETER Weeks in Treatment: 0 Wound Status Wound Number: 2 Primary MASD Etiology: Wound Location: Gluteus Wound Open Wounding Event: Gradually Appeared Status: Date Acquired: 01/27/2022 Comorbid Chronic Obstructive Pulmonary Disease (COPD), Sleep Apnea, Weeks Of Treatment: 0 History: Congestive Heart Failure, Hypertension, Type II Diabetes, Gout, Clustered Wound: No Osteoarthritis, Neuropathy Photos Wound Measurements Length: (cm) 5.5 Width: (cm) 15 Depth: (cm) 0.1 Area: (cm) 64.795 Volume: (cm) 6.48 % Reduction in Area: 0% % Reduction in Volume: 0% Epithelialization: None Tunneling: No Undermining: No Wound Description Classification: Full Thickness With Exposed Support Structures Wound Margin: Distinct, outline attached Exudate Amount: Medium Exudate Type: Serosanguineous Exudate Color: red, brown Foul Odor After Cleansing: No Slough/Fibrino Yes Wound Bed Granulation Amount: Large (67-100%) Exposed Structure Granulation Quality: Red, Pink Fascia Exposed: No Necrotic Amount: Small (1-33%) Fat Layer (Subcutaneous Tissue) Exposed: Yes Necrotic Quality: Adherent Slough Tendon Exposed: No Muscle Exposed: No Joint Exposed: No Bone Exposed: No Electronic Signature(s) Signed: 05/27/2022 5:08:20 PM By: Deon Pilling RN, BSN Signed: 05/28/2022 4:06:51 PM By: Rhae Hammock RN Entered By: Deon Pilling on 05/27/2022  08:45:47 -------------------------------------------------------------------------------- Vitals Details Patient Name: Date of Service: LO WE, Sumie M. 05/27/2022 8:00  A M Medical Record Number: [id number] Patient Account Number: [id number] Date of Birth/Sex: Treating RN: 1927/11/20 (88 y.o. Tonita Phoenix, Lauren Primary Care Bauer Ausborn: RA GO NESI, PETER Other Clinician: Referring Noa Constante: Treating Krystyne Tewksbury/Extender: Kalman Shan RA GO NESI, PETER Weeks in Treatment: 0 Vital Signs Time Taken: 08:18 Temperature (F): 98.3 Height (in): 65 Pulse (bpm): 112 Respiratory Rate (breaths/min): 17 Blood Pressure (mmHg): 162/77 Reference Range: 80 - 120 mg / dl Electronic Signature(s) Signed: 05/28/2022 4:06:51 PM By: Rhae Hammock RN Entered By: Rhae Hammock on 05/27/2022 08:20:20

## 2022-05-28 NOTE — Progress Notes (Signed)
Debbie Arnold, Debbie Arnold ([id number]) Visit Report for 05/27/2022 Abuse Risk Screen Details Patient Name: Date of Service: Debbie Arnold, Debbie Arnold. 05/27/2022 8:00 A M Medical Record Number: [id number] Patient Account Number: [id number] Date of Birth/Sex: Treating RN: Jun 26, 1927 (88 y.o. Tonita Phoenix, Lauren Primary Care Skylin Kennerson: RA GO NESI, PETER Other Clinician: Referring Syrena Burges: Treating Clyda Smyth/Extender: Kalman Shan RA GO NESI, PETER Weeks in Treatment: 0 Abuse Risk Screen Items Answer ABUSE RISK SCREEN: Has anyone close to you tried to hurt or harm you recentlyo No Do you feel uncomfortable with anyone in your familyo No Has anyone forced you do things that you didnt want to doo No Electronic Signature(s) Signed: 05/28/2022 4:06:51 PM By: Rhae Hammock RN Entered By: Rhae Hammock on 05/27/2022 08:24:59 -------------------------------------------------------------------------------- Activities of Daily Living Details Patient Name: Date of Service: Debbie Arnold, Debbie Arnold 05/27/2022 8:00 A M Medical Record Number: [id number] Patient Account Number: [id number] Date of Birth/Sex: Treating RN: 01-12-1927 (87 y.o. Tonita Phoenix, Lauren Primary Care Caeleigh Prohaska: RA GO NESI, PETER Other Clinician: Referring Nida Manfredi: Treating Cella Cappello/Extender: Kalman Shan RA GO NESI, PETER Weeks in Treatment: 0 Activities of Daily Living Items Answer Activities of Daily Living (Please select one for each item) Drive Automobile Not Able T Medications ake Need Assistance Use T elephone Need Assistance Care for Appearance Need Assistance Use T oilet Need Assistance Bath / Shower Need Assistance Dress Self Need Assistance Feed Self Need Assistance Walk Need Assistance Get In / Out Bed Need Assistance Housework Need Assistance Prepare Meals Need Assistance Handle Money Need Assistance Shop for Self Need Assistance Electronic Signature(s) Signed: 05/28/2022 4:06:51 PM By: Rhae Hammock RN Entered  By: Rhae Hammock on 05/27/2022 08:25:31 -------------------------------------------------------------------------------- Education Screening Details Patient Name: Date of Service: Debbie Arnold, Debbie Schmid. 05/27/2022 8:00 A M Medical Record Number: [id number] Patient Account Number: [id number] Date of Birth/Sex: Treating RN: 22-Oct-1927 (88 y.o. Tonita Phoenix, Lauren Primary Care Ramisa Duman: RA GO NESI, PETER Other Clinician: Referring Mahamud Metts: Treating Shaneka Efaw/Extender: Kalman Shan RA GO NESI, PETER Weeks in Treatment: 0 Primary Learner Assessed: Patient Learning Preferences/Education Level/Primary Language Learning Preference: Explanation, Demonstration, Communication Board, Printed Material Highest Education Level: High School Preferred Language: English Cognitive Barrier Language Barrier: No Translator Needed: No Memory Deficit: No Emotional Barrier: No Cultural/Religious Beliefs Affecting Medical Care: No Physical Barrier Impaired Vision: Yes Glasses Impaired Hearing: No Decreased Hand dexterity: No Knowledge/Comprehension Knowledge Level: High Comprehension Level: High Ability to understand written instructions: High Ability to understand verbal instructions: High Motivation Anxiety Level: Calm Cooperation: Cooperative Education Importance: Denies Need Interest in Health Problems: Asks Questions Perception: Coherent Willingness to Engage in Self-Management High Activities: Readiness to Engage in Self-Management High Activities: Electronic Signature(s) Signed: 05/28/2022 4:06:51 PM By: Rhae Hammock RN Entered By: Rhae Hammock on 05/27/2022 08:26:30 -------------------------------------------------------------------------------- Fall Risk Assessment Details Patient Name: Date of Service: Debbie Arnold, Debbie M. 05/27/2022 8:00 A M Medical Record Number: [id number] Patient Account Number: [id number] Date of Birth/Sex: Treating RN: 1927-03-04 (88 y.o. Tonita Phoenix, Lauren Primary Care Ina Scrivens: RA GO NESI, PETER Other Clinician: Referring Avaleigh Decuir: Treating Rashawnda Gaba/Extender: Kalman Shan RA GO NESI, PETER Weeks in Treatment: 0 Fall Risk Assessment Items Have you had 2 or more falls in the last 12 monthso 0 Yes Have you had any fall that resulted in injury in the last 12 monthso 0 No FALLS RISK SCREEN History of falling - immediate or within 3 months 25 Yes Secondary diagnosis (Do you have 2 or more medical diagnoseso) 0 No Ambulatory aid None/bed rest/wheelchair/nurse 0 No Crutches/cane/walker 0 No  Furniture 0 No Intravenous therapy Access/Saline/Heparin Lock 0 No Gait/Transferring Normal/ bed rest/ wheelchair 0 No Weak (short steps with or without shuffle, stooped but able to lift head while walking, may seek 0 No support from furniture) Impaired (short steps with shuffle, may have difficulty arising from chair, head down, impaired 0 No balance) Mental Status Oriented to own ability 0 No Electronic Signature(s) Signed: 05/28/2022 4:06:51 PM By: Rhae Hammock RN Entered By: Rhae Hammock on 05/27/2022 08:26:52 -------------------------------------------------------------------------------- Foot Assessment Details Patient Name: Date of Service: Debbie Jury. 05/27/2022 8:00 A M Medical Record Number: [id number] Patient Account Number: [id number] Date of Birth/Sex: Treating RN: 1926/12/31 (88 y.o. Tonita Phoenix, Lauren Primary Care Aydden Cumpian: RA GO NESI, PETER Other Clinician: Referring Makana Rostad: Treating Eveleen Mcnear/Extender: Kalman Shan RA GO NESI, PETER Weeks in Treatment: 0 Foot Assessment Items Site Locations + = Sensation present, - = Sensation absent, C = Callus, U = Ulcer R = Redness, W = Warmth, M = Maceration, PU = Pre-ulcerative lesion F = Fissure, S = Swelling, D = Dryness Assessment Right: Left: Other Deformity: No No Prior Foot Ulcer: No No Prior Amputation: No No Charcot Joint: No  No Ambulatory Status: Gait: Notes N/A no LE wounds Electronic Signature(s) Signed: 05/28/2022 4:06:51 PM By: Rhae Hammock RN Entered By: Rhae Hammock on 05/27/2022 08:27:08 -------------------------------------------------------------------------------- Nutrition Risk Screening Details Patient Name: Date of Service: Debbie Jury. 05/27/2022 8:00 A M Medical Record Number: [id number] Patient Account Number: [id number] Date of Birth/Sex: Treating RN: 02-16-1927 (88 y.o. Tonita Phoenix, Lauren Primary Care Chez Bulnes: RA GO NESI, PETER Other Clinician: Referring Ethelmae Ringel: Treating Bryce Cheever/Extender: Kalman Shan RA GO NESI, PETER Weeks in Treatment: 0 Height (in): 65 Weight (lbs): Body Mass Index (BMI): Nutrition Risk Screening Items Score Screening NUTRITION RISK SCREEN: I have an illness or condition that made me change the kind and/or amount of food I eat 0 No I eat fewer than two meals per day 0 No I eat few fruits and vegetables, or milk products 0 No I have three or more drinks of beer, liquor or wine almost every day 0 No I have tooth or mouth problems that make it hard for me to eat 0 No I don't always have enough money to buy the food I need 0 No I eat alone most of the time 0 No I take three or more different prescribed or over-the-counter drugs a day 0 No Without wanting to, I have lost or gained 10 pounds in the last six months 0 No I am not always physically able to shop, cook and/or feed myself 0 No Nutrition Protocols Good Risk Protocol 0 No interventions needed Moderate Risk Protocol High Risk Proctocol Risk Level: Good Risk Score: 0 Electronic Signature(s) Signed: 05/28/2022 4:06:51 PM By: Rhae Hammock RN Entered By: Rhae Hammock on 05/27/2022 06:23:76

## 2022-05-28 NOTE — Progress Notes (Signed)
Debbie Arnold ([id number]) Visit Report for 05/27/2022 Chief Complaint Document Details Patient Name: Date of Service: Debbie Arnold. 05/27/2022 8:00 A M Medical Record Number: [id number] Patient Account Number: [id number] Date of Birth/Sex: Treating RN: 09-10-1927 (88 y.o. Benjaman Lobe Primary Care Provider: RA GO NESI, PETER Other Clinician: Referring Provider: Treating Provider/Extender: Kalman Shan RA GO NESI, PETER Weeks in Treatment: 0 Information Obtained from: Patient Chief Complaint 05/27/2022; sacral and buttocks wounds Electronic Signature(s) Signed: 05/27/2022 5:08:18 PM By: Kalman Shan DO Entered By: Kalman Shan on 05/27/2022 10:19:34 -------------------------------------------------------------------------------- HPI Details Patient Name: Date of Service: Debbie Arnold, Debbie M. 05/27/2022 8:00 A M Medical Record Number: [id number] Patient Account Number: [id number] Date of Birth/Sex: Treating RN: 15-May-1927 (88 y.o. Tonita Phoenix, Lauren Primary Care Provider: RA GO NESI, PETER Other Clinician: Referring Provider: Treating Provider/Extender: Kalman Shan RA GO NESI, PETER Weeks in Treatment: 0 History of Present Illness HPI Description: Admission 05/27/2022 Ms. Debbie Arnold is a 88 year old female with a past medical history of COPD, type 2 diabetes and cervical myelopathy that presents to the clinic for a 3 to 4- month history of wounds to her buttocks and sacral region. Patient has cervical myelopathy and she had bilateral cervical posterior decompression and laminectomy of C3-C6 on 02/27/2021. Prior to her surgery she was mobile however extremely limited in her activity. Postsurgery she has been wheelchair dependent or bedbound. She has gained 50 pounds and it has been increasingly difficult to try and be active. As a result she has incontinence. She states that she could go for many hours without being changed. She resides in an assisted living  facility. She states that her wounds are dressed sporadically and may not be daily. She has been using zinc oxide to the wounds. She currently denies signs of infection. Electronic Signature(s) Signed: 05/27/2022 5:08:18 PM By: Kalman Shan DO Entered By: Kalman Shan on 05/27/2022 10:39:07 -------------------------------------------------------------------------------- Physical Exam Details Patient Name: Date of Service: Debbie Arnold. 05/27/2022 8:00 A M Medical Record Number: [id number] Patient Account Number: [id number] Date of Birth/Sex: Treating RN: December 05, 1927 (88 y.o. Tonita Phoenix, Lauren Primary Care Provider: Other Clinician: RA GO NESI, PETER Referring Provider: Treating Provider/Extender: Kalman Shan RA GO NESI, PETER Weeks in Treatment: 0 Constitutional respirations regular, non-labored and within target range for patient.Marland Kitchen Psychiatric pleasant and cooperative. Notes T the sacral region there is a small slitlike wound with granulation tissue present o T the right buttocks there are slitlike wounds that have some nonviable tissue on the surface. Mostly granulation tissue present. No signs of surrounding o pension to any of the wound beds. Electronic Signature(s) Signed: 05/27/2022 5:08:18 PM By: Kalman Shan DO Entered By: Kalman Shan on 05/27/2022 10:39:54 -------------------------------------------------------------------------------- Physician Orders Details Patient Name: Date of Service: Debbie Arnold, Debbie M. 05/27/2022 8:00 A M Medical Record Number: [id number] Patient Account Number: [id number] Date of Birth/Sex: Treating RN: 1927/04/08 (88 y.o. Tonita Phoenix, Lauren Primary Care Provider: RA GO NESI, PETER Other Clinician: Referring Provider: Treating Provider/Extender: Kalman Shan RA GO NESI, PETER Weeks in Treatment: 0 Verbal / Phone Orders: No Diagnosis Coding ICD-10 Coding Code Description L89.152 Pressure ulcer of sacral region,  stage 2 S31.819A Unspecified open wound of right buttock, initial encounter Follow-up Appointments ppointment in 1 week. - Thursday 06/12/22 @ 1045 w/ Dr. Heber [place] and Allayne Butcher Room # 9 ****Harrel Lemon needs 70 minutes**** Return A Bathing/ Shower/ Hygiene May shower with protection but do not get wound dressing(s) wet. Off-Loading Turn and reposition every 2 hours Home Health  dmit to [place] for wound care. May utilize formulary equivalent dressing for wound treatment orders unless otherwise specified. - A [facility] Dressing changes to be completed by [place] on Monday / Wednesday / Friday except when patient has scheduled visit at [facility]. Wound Treatment Wound #1 - Sacrum Cleanser: Vashe 5.8 (oz) (DME) (Generic) 1 x Per Day/15 Days Discharge Instructions: Cleanse the wound with Vashe prior to applying a clean dressing using gauze sponges, not tissue or cotton balls. Peri-Wound Care: Zinc Oxide Ointment 30g tube 1 x Per Day/15 Days Discharge Instructions: Apply Zinc Oxide to periwound with each dressing change Prim Dressing: MediHoney Calcium Alginate Dressing 4x5 in 1 x Per Day/15 Days ary Discharge Instructions: Apply to wound bed as instructed Secondary Dressing: ABD Pad, 5x9 (DME) (Generic) 1 x Per Day/15 Days Discharge Instructions: Apply over primary dressing as directed. Wound #2 - Gluteus Cleanser: Vashe 5.8 (oz) (DME) (Generic) 1 x Per Day/15 Days Discharge Instructions: Cleanse the wound with Vashe prior to applying a clean dressing using gauze sponges, not tissue or cotton balls. Peri-Wound Care: Zinc Oxide Ointment 30g tube 1 x Per Day/15 Days Discharge Instructions: Apply Zinc Oxide to periwound with each dressing change Prim Dressing: MediHoney Calcium Alginate Dressing 4x5 in 1 x Per Day/15 Days ary Discharge Instructions: Apply to wound bed as instructed Secondary Dressing: ABD Pad, 5x9 (DME) (Generic) 1 x Per Day/15 Days Discharge Instructions: Apply  over primary dressing as directed. Electronic Signature(s) Signed: 05/27/2022 5:08:18 PM By: Kalman Shan DO Entered By: Kalman Shan on 05/27/2022 10:40:09 -------------------------------------------------------------------------------- Problem List Details Patient Name: Date of Service: Debbie Arnold, Debbie M. 05/27/2022 8:00 A M Medical Record Number: [id number] Patient Account Number: [id number] Date of Birth/Sex: Treating RN: 11-12-1927 (88 y.o. Tonita Phoenix, Lauren Primary Care Provider: RA GO NESI, PETER Other Clinician: Referring Provider: Treating Provider/Extender: Kalman Shan RA GO NESI, PETER Weeks in Treatment: 0 Active Problems ICD-10 Encounter Code Description Active Date MDM Diagnosis L89.152 Pressure ulcer of sacral region, stage 2 05/27/2022 No Yes S31.819A Unspecified open wound of right buttock, initial encounter 05/27/2022 No Yes Z99.3 Dependence on wheelchair 05/27/2022 No Yes J44.9 Chronic obstructive pulmonary disease, unspecified 05/27/2022 No Yes E11.622 Type 2 diabetes mellitus with other skin ulcer 05/27/2022 No Yes M48.062 Spinal stenosis, lumbar region with neurogenic claudication 05/27/2022 No Yes Inactive Problems Resolved Problems Electronic Signature(s) Signed: 05/27/2022 5:08:18 PM By: Kalman Shan DO Entered By: Kalman Shan on 05/27/2022 10:18:36 -------------------------------------------------------------------------------- Progress Note Details Patient Name: Date of Service: Debbie Arnold, Debbie M. 05/27/2022 8:00 A M Medical Record Number: [id number] Patient Account Number: [id number] Date of Birth/Sex: Treating RN: 08-Dec-1927 (88 y.o. Tonita Phoenix, Lauren Primary Care Provider: RA GO NESI, PETER Other Clinician: Referring Provider: Treating Provider/Extender: Kalman Shan RA GO NESI, PETER Weeks in Treatment: 0 Subjective Chief Complaint Information obtained from Patient 05/27/2022; sacral and buttocks wounds History of Present  Illness (HPI) Admission 05/27/2022 Ms. Debbie Arnold is a 88 year old female with a past medical history of COPD, type 2 diabetes and cervical myelopathy that presents to the clinic for a 3 to 4- month history of wounds to her buttocks and sacral region. Patient has cervical myelopathy and she had bilateral cervical posterior decompression and laminectomy of C3-C6 on 02/27/2021. Prior to her surgery she was mobile however extremely limited in her activity. Postsurgery she has been wheelchair dependent or bedbound. She has gained 50 pounds and it has been increasingly difficult to try and be active. As a result she has incontinence. She  states that she could go for many hours without being changed. She resides in an assisted living facility. She states that her wounds are dressed sporadically and may not be daily. She has been using zinc oxide to the wounds. She currently denies signs of infection. Patient History Information obtained from Chart. Allergies brimonidine (Reaction: itching), celecoxib, red dye, codeine (Reaction: anxiety) Social History Never smoker, Marital Status - Widowed, Alcohol Use - Never, Drug Use - No History, Caffeine Use - Never. Medical History Respiratory Patient has history of Chronic Obstructive Pulmonary Disease (COPD), Sleep Apnea Cardiovascular Patient has history of Congestive Heart Failure - EF 45%, Hypertension Endocrine Patient has history of Type II Diabetes Musculoskeletal Patient has history of Gout, Osteoarthritis Neurologic Patient has history of Neuropathy Hospitalization/Surgery History - 02/2021 bilateral cervical posterior decompression and laminectomies C3-6- cervical stenosis with myleopathy. Medical A Surgical History Notes nd Constitutional Symptoms (General Health) thyroid disease 02/2021 bilateral cervical posterior decompression and laminectomies C3-6- cervical stenosis with myleopathy- PT sees patient for PT for ambulation therapy-  nonambulatory since 05/2021. Respiratory PE Genitourinary Stage III CKD Musculoskeletal DDD Objective Constitutional respirations regular, non-labored and within target range for patient.. Vitals Time Taken: 8:18 AM, Height: 65 in, Temperature: 98.3 F, Pulse: 112 bpm, Respiratory Rate: 17 breaths/min, Blood Pressure: 162/77 mmHg. Psychiatric pleasant and cooperative. General Notes: T the sacral region there is a small slitlike wound with granulation tissue present T the right buttocks there are slitlike wounds that have some o o nonviable tissue on the surface. Mostly granulation tissue present. No signs of surrounding pension to any of the wound beds. Integumentary (Hair, Skin) Wound #1 status is Open. Original cause of wound was Pressure Injury. The date acquired was: 01/27/2022. The wound is located on the Sacrum. The wound measures 2cm length x 0.2cm width x 0.2cm depth; 0.314cm^2 area and 0.063cm^3 volume. There is Fat Layer (Subcutaneous Tissue) exposed. There is no tunneling or undermining noted. There is a medium amount of serosanguineous drainage noted. The wound margin is distinct with the outline attached to the wound base. There is large (67-100%) red, pink granulation within the wound bed. Wound #2 status is Open. Original cause of wound was Gradually Appeared. The date acquired was: 01/27/2022. The wound is located on the Gluteus. The wound measures 5.5cm length x 15cm width x 0.1cm depth; 64.795cm^2 area and 6.48cm^3 volume. There is Fat Layer (Subcutaneous Tissue) exposed. There is no tunneling or undermining noted. There is a medium amount of serosanguineous drainage noted. The wound margin is distinct with the outline attached to the wound base. There is large (67-100%) red, pink granulation within the wound bed. There is a small (1-33%) amount of necrotic tissue within the wound bed including Adherent Slough. Assessment Active Problems ICD-10 Pressure ulcer of sacral  region, stage 2 Unspecified open wound of right buttock, initial encounter Dependence on wheelchair Chronic obstructive pulmonary disease, unspecified Type 2 diabetes mellitus with other skin ulcer Spinal stenosis, lumbar region with neurogenic claudication Patient presents with a 3-33-monthhistory of nonhealing ulcers to her sacrum and buttocks. These are mainly caused by the fact that she is bedbound or wheelchair dependent. This was a result of severe cervical myelopathy. I recommended that her briefs be changed every time she is incontinent and there be aggressive wound management. This includes using washes a wound cleanser and AandD ointment to the surrounding periwound. I recommended Medihoney to the actual wound beds. She has no signs of surrounding infection. I recommended she reposition every couple  hours. With aggressive care these can likely heal up over the next month. I am not sure if this is going to be a possibility since she is in assisted living facility. We will order home health. Follow-up in 2 weeks. 51 minutes was spent on the encounter including face-to-face, EMR review and coordination of care Plan Follow-up Appointments: Return Appointment in 1 week. - Thursday 06/12/22 @ 1045 w/ Dr. Heber [place] and Allayne Butcher Room # 9 ****Harrel Lemon needs 70 minutes**** Bathing/ Shower/ Hygiene: May shower with protection but do not get wound dressing(s) wet. Off-Loading: Turn and reposition every 2 hours Home Health: Admit to [place] for wound care. May utilize formulary equivalent dressing for wound treatment orders unless otherwise specified. - [facility] Dressing changes to be completed by [place] on Monday / Wednesday / Friday except when patient has scheduled visit at [facility]. WOUND #1: - Sacrum Wound Laterality: Cleanser: Vashe 5.8 (oz) (DME) (Generic) 1 x Per Day/15 Days Discharge Instructions: Cleanse the wound with Vashe prior to applying a clean dressing using gauze  sponges, not tissue or cotton balls. Peri-Wound Care: Zinc Oxide Ointment 30g tube 1 x Per Day/15 Days Discharge Instructions: Apply Zinc Oxide to periwound with each dressing change Prim Dressing: MediHoney Calcium Alginate Dressing 4x5 in 1 x Per Day/15 Days ary Discharge Instructions: Apply to wound bed as instructed Secondary Dressing: ABD Pad, 5x9 (DME) (Generic) 1 x Per Day/15 Days Discharge Instructions: Apply over primary dressing as directed. WOUND #2: - Gluteus Wound Laterality: Cleanser: Vashe 5.8 (oz) (DME) (Generic) 1 x Per Day/15 Days Discharge Instructions: Cleanse the wound with Vashe prior to applying a clean dressing using gauze sponges, not tissue or cotton balls. Peri-Wound Care: Zinc Oxide Ointment 30g tube 1 x Per Day/15 Days Discharge Instructions: Apply Zinc Oxide to periwound with each dressing change Prim Dressing: MediHoney Calcium Alginate Dressing 4x5 in 1 x Per Day/15 Days ary Discharge Instructions: Apply to wound bed as instructed Secondary Dressing: ABD Pad, 5x9 (DME) (Generic) 1 x Per Day/15 Days Discharge Instructions: Apply over primary dressing as directed. 1. Medihoney 2. AandE ointment 3. Aggressive offloading 4. Follow-up in 2 weeks Electronic Signature(s) Signed: 05/27/2022 5:08:18 PM By: Kalman Shan DO Entered By: Kalman Shan on 05/27/2022 10:42:21 -------------------------------------------------------------------------------- HxROS Details Patient Name: Date of Service: Debbie Arnold, Debbie M. 05/27/2022 8:00 A M Medical Record Number: [id number] Patient Account Number: [id number] Date of Birth/Sex: Treating RN: May 11, 1927 (88 y.o. Debbie Arnold Primary Care Provider: RA GO NESI, PETER Other Clinician: Referring Provider: Treating Provider/Extender: Kalman Shan RA GO NESI, PETER Weeks in Treatment: 0 Information Obtained From Chart Constitutional Symptoms (General Health) Medical History: Past Medical History  Notes: thyroid disease 02/2021 bilateral cervical posterior decompression and laminectomies C3-6- cervical stenosis with myleopathy- PT sees patient for PT for ambulation therapy- nonambulatory since 05/2021. Respiratory Medical History: Positive for: Chronic Obstructive Pulmonary Disease (COPD); Sleep Apnea Past Medical History Notes: PE Cardiovascular Medical History: Positive for: Congestive Heart Failure - EF 45%; Hypertension Endocrine Medical History: Positive for: Type II Diabetes Genitourinary Medical History: Past Medical History Notes: Stage III CKD Musculoskeletal Medical History: Positive for: Gout; Osteoarthritis Past Medical History Notes: DDD Neurologic Medical History: Positive for: Neuropathy Immunizations Pneumococcal Vaccine: Received Pneumococcal Vaccination: No Implantable Devices No devices added Hospitalization / Surgery History Type of Hospitalization/Surgery 02/2021 bilateral cervical posterior decompression and laminectomies C3-6- cervical stenosis with myleopathy Family and Social History Never smoker; Marital Status - Widowed; Alcohol Use: Never; Drug Use: No History; Caffeine Use: Never;  Financial Concerns: No; Food, Clothing or Shelter Needs: No; Support System Lacking: No; Transportation Concerns: No Electronic Signature(s) Signed: 05/27/2022 5:08:18 PM By: Kalman Shan DO Signed: 05/27/2022 5:08:20 PM By: Deon Pilling RN, BSN Signed: 05/28/2022 4:06:51 PM By: Rhae Hammock RN Entered By: Rhae Hammock on 05/27/2022 08:24:53 -------------------------------------------------------------------------------- SuperBill Details Patient Name: Date of Service: Debbie Arnold, Debbie Arnold 05/27/2022 Medical Record Number: [id number] Patient Account Number: [id number] Date of Birth/Sex: Treating RN: [birth year] (88 y.o. Tonita Phoenix, Lauren Primary Care Provider: RA GO NESI, PETER Other Clinician: Referring Provider: Treating Provider/Extender:  Kalman Shan RA GO NESI, PETER Weeks in Treatment: 0 Diagnosis Coding ICD-10 Codes Code Description L89.152 Pressure ulcer of sacral region, stage 2 S31.819A Unspecified open wound of right buttock, initial encounter Z99.3 Dependence on wheelchair J44.9 Chronic obstructive pulmonary disease, unspecified E11.622 Type 2 diabetes mellitus with other skin ulcer M48.062 Spinal stenosis, lumbar region with neurogenic claudication Facility Procedures CPT4 Code: [id number] Description: 99214 - WOUND CARE VISIT-LEV 4 EST PT Modifier: Quantity: 1 Physician Procedures : CPT4 Code Description Modifier [id number] - WC PHYS LEVEL 4 - NEW PT ICD-10 Diagnosis Description L89.152 Pressure ulcer of sacral region, stage 2 S31.819A Unspecified open wound of right buttock, initial encounter E11.622 Type 2 diabetes mellitus  with other skin ulcer Z99.3 Dependence on wheelchair Quantity: 1 Electronic Signature(s) Signed: 05/27/2022 5:08:18 PM By: Kalman Shan DO Entered By: Kalman Shan on 05/27/2022 10:43:23

## 2022-06-12 ENCOUNTER — Ambulatory Visit (HOSPITAL_BASED_OUTPATIENT_CLINIC_OR_DEPARTMENT_OTHER): Payer: Medicare HMO | Admitting: Internal Medicine

## 2023-01-30 ENCOUNTER — Ambulatory Visit (INDEPENDENT_AMBULATORY_CARE_PROVIDER_SITE_OTHER): Payer: Medicare HMO | Admitting: Podiatry

## 2023-01-30 ENCOUNTER — Encounter: Payer: Self-pay | Admitting: Podiatry

## 2023-01-30 ENCOUNTER — Telehealth: Payer: Self-pay | Admitting: *Deleted

## 2023-01-30 VITALS — BP 95/65

## 2023-01-30 DIAGNOSIS — M2141 Flat foot [pes planus] (acquired), right foot: Secondary | ICD-10-CM | POA: Diagnosis not present

## 2023-01-30 DIAGNOSIS — M79675 Pain in left toe(s): Secondary | ICD-10-CM

## 2023-01-30 DIAGNOSIS — M79674 Pain in right toe(s): Secondary | ICD-10-CM | POA: Diagnosis not present

## 2023-01-30 DIAGNOSIS — B351 Tinea unguium: Secondary | ICD-10-CM | POA: Diagnosis not present

## 2023-01-30 DIAGNOSIS — M2142 Flat foot [pes planus] (acquired), left foot: Secondary | ICD-10-CM

## 2023-01-30 DIAGNOSIS — R600 Localized edema: Secondary | ICD-10-CM | POA: Diagnosis not present

## 2023-01-30 DIAGNOSIS — E1142 Type 2 diabetes mellitus with diabetic polyneuropathy: Secondary | ICD-10-CM

## 2023-01-30 DIAGNOSIS — E119 Type 2 diabetes mellitus without complications: Secondary | ICD-10-CM

## 2023-01-30 NOTE — Telephone Encounter (Signed)
error 

## 2023-01-30 NOTE — Progress Notes (Unsigned)
Subjective: Debbie Arnold presents today {jgcomplaint:23593}.  Patient relates {Numbers; 0-100:15068} year h/o diabetes.  Patient denies any h/o foot wounds.  Patient has h/o foot ulcer of {jgPodToeLocator:23637}, which healed via help of ***.  Patient endorses symptoms of foot numbness.   Patient endorses symptoms of foot tingling.  Patient endorses symptoms of burning in feet.  Patient endorses symptoms of pins/needles sensation in feet.  Patient denies any numbness, tingling, burning, or pins/needle sensation in feet.  Patient has been diagnosed with neuropathy and it is managed with {JGNEUROPATHYMEDS:27053}.  Risk factors: {jgriskfactors:24044}.  PCP is Tempie Hoist, MD , and last visit was {Time; dates multiple:304500300}.  Past Medical History:  Diagnosis Date   COPD (chronic obstructive pulmonary disease) ([place])    Diabetes mellitus without complication ([place])    Gout    HFrEF (heart failure with reduced ejection fraction) ([place])    40-45%, mild MR, mod TR   Hypertension    Pulmonary embolism ([place])    Stage III chronic kidney disease ([place])    Thyroid disease     Patient Active Problem List   Diagnosis Date Noted   Acute pulmonary edema ([place])    Pulmonary embolism ([place]) 01/26/2022   Gout 01/26/2022   Stage III chronic kidney disease ([place]) 01/26/2022   Acute lower UTI 01/01/2022   Class 3 obesity ([place]) 01/01/2022   Depression 01/01/2022   Acute kidney injury superimposed on chronic kidney disease ([place]) 12/30/2021   Acute on chronic diastolic CHF (congestive heart failure) ([place]) 12/30/2021   CHF (congestive heart failure) ([place]) 12/30/2021   Acute kidney injury ([place]) 11/24/2017   Hyponatremia 11/24/2017   Diabetes mellitus type 2 in obese ([place]) 11/23/2017   Hyperlipidemia with target LDL less than 100 11/23/2017   Lumbar facet arthropathy 05/28/2017   Steroid responder, bilateral 01/12/2017   COPD (chronic obstructive pulmonary disease) ([place]) 08/05/2016    Gastroesophageal reflux disease without esophagitis 08/02/2015   Laryngopharyngeal reflux 08/02/2015   Corneal edema 07/03/2015   XT (exotropia) 07/03/2015   Anterior uveitis 06/22/2015   Pain of right eye 05/21/2015   Allergic conjunctivitis of both eyes 03/14/2014   Anxiety, generalized 01/04/2014   Pseudophakia of both eyes 04/27/2013   Obesity, Class III, BMI 40-49.9 (morbid obesity) ([place]) 04/13/2013   Peripheral neuropathy 12/14/2012   Diabetes ([place]) 11/10/2012   Primary open angle glaucoma of both eyes, mild stage 11/10/2012   Spinal stenosis of lumbar region with neurogenic claudication 09/08/2012   Low back pain 07/19/2012   Diabetes mellitus with stage 3 chronic kidney disease ([place]) 11/12/2010   HTN (hypertension) 11/29/2009   Benign neoplasm of skin 05/02/2009   Hypothyroidism 05/02/2009   Constipation 04/16/2009   Other affections of shoulder region, not elsewhere classified 05/30/2008   Obstructive sleep apnea syndrome 04/07/2008   Anemia, chronic disease 03/22/2008   Alopecia 02/02/2008   DDD (degenerative disc disease), lumbar 01/18/2008   Osteoarthritis of multiple joints 01/18/2008    No past surgical history on file.  Current Outpatient Medications on File Prior to Visit  Medication Sig Dispense Refill   acetaminophen (TYLENOL) 500 MG tablet Take 1,000 mg by mouth 3 (three) times daily.     albuterol (PROVENTIL HFA;VENTOLIN HFA) 108 (90 BASE) MCG/ACT inhaler Inhale 2 puffs into the lungs every 4 (four) hours as needed for wheezing or shortness of breath.     aspirin 81 MG chewable tablet Take 81 mg by mouth daily.     budesonide (PULMICORT) 0.5 MG/2ML nebulizer solution Take 0.5 mg by  nebulization 2 (two) times daily.     cyclobenzaprine (FLEXERIL) 5 MG tablet Take 5 mg by mouth 3 (three) times daily.     diclofenac Sodium (VOLTAREN) 1 % GEL Apply 2-4 g topically in the morning and at bedtime. Left shoulder/arm     dorzolamide (TRUSOPT) 2 % ophthalmic solution  Place 1 drop into both eyes 2 (two) times daily.     doxazosin (CARDURA) 1 MG tablet Take 1 mg by mouth daily.     empagliflozin (JARDIANCE) 10 MG TABS tablet Take 1 tablet (10 mg total) by mouth daily. 30 tablet 0   escitalopram (LEXAPRO) 10 MG tablet Take 10 mg by mouth daily.     fluticasone (FLONASE) 50 MCG/ACT nasal spray Place 2 sprays into both nostrils in the morning and at bedtime.     folic acid (FOLVITE) 1 MG tablet Take 5 mg by mouth daily.     furosemide (LASIX) 40 MG tablet Take 1.5 tablets (60 mg total) by mouth daily. 30 tablet 0   gabapentin (NEURONTIN) 100 MG capsule Take 300 mg by mouth 2 (two) times daily.     glipiZIDE (GLUCOTROL) 5 MG tablet Take 5 mg by mouth daily before breakfast.     guaiFENesin (MUCINEX) 600 MG 12 hr tablet Take 1,200 mg by mouth 2 (two) times daily. (Every 12 hours)     levothyroxine (SYNTHROID) 125 MCG tablet Take 125 mcg by mouth daily before breakfast.     losartan (COZAAR) 100 MG tablet Take 100 mg by mouth daily.     magnesium citrate SOLN Take 150 mLs by mouth every 12 (twelve) hours.     Menthol-Methyl Salicylate (SALONPAS PAIN RELIEF PATCH EX) Apply 1 patch topically See admin instructions. Apply to left shoulder every 12 hours as needed for pain. Leave on for 12 hours, then off for 12 hours     metoprolol succinate (TOPROL-XL) 50 MG 24 hr tablet Take 1 tablet (50 mg total) by mouth daily. Take with or immediately following a meal. 30 tablet 0   omeprazole (PRILOSEC) 40 MG capsule Take 40 mg by mouth daily.     polyethylene glycol powder (GLYCOLAX/MIRALAX) 17 GM/SCOOP powder Take 1 Container by mouth See admin instructions. 1 scoop bid     psyllium (REGULOID) 0.52 g capsule Take 0.52 g by mouth at bedtime.     senna (SENOKOT) 8.6 MG tablet Take 2 tablets by mouth 2 (two) times daily.     umeclidinium-vilanterol (ANORO ELLIPTA) 62.5-25 MCG/ACT AEPB Inhale 1 puff into the lungs daily.     No current facility-administered medications on file  prior to visit.     Allergies  Allergen Reactions   Brimonidine Itching    Not listed on med list from [facility]   Celecoxib Other (See Comments)   Red Dye Other (See Comments)    Not listed on med list from [facility]   Codeine Anxiety    Social History   Occupational History   Not on file  Tobacco Use   Smoking status: Never   Smokeless tobacco: Never  Substance and Sexual Activity   Alcohol use: No   Drug use: No   Sexual activity: Not on file    No family history on file.  Immunization History  Administered Date(s) Administered   PFIZER(Purple Top)SARS-COV-2 Vaccination 12/16/2019, 01/06/2020, 09/06/2020    Objective: There were no vitals filed for this visit.  Debbie Arnold is a pleasant 89 y.o. female {jgbodyhabitus:24098} AAO X 3.  Vascular  Examination: {jgvascular:23595}  Dermatological Examination: {jgderm:23598}  Neurological Examination: {jgneuro:23601::"Protective sensation intact 5/5 intact bilaterally with 10g monofilament b/l.","Vibratory sensation intact b/l.","Proprioception intact bilaterally."}  Musculoskeletal Examination: {jgmsk:23600}  Footwear Assessment: Does the patient wear appropriate shoes? {Yes,No}. Does the patient need inserts/orthotics? {Yes,No}.  Lab Results  Component Value Date   HGBA1C 7.5 (H) 12/30/2021   No results found. Assessment: No diagnosis found.   ADA Risk Categorization: Low Risk:  Patient has all of the following: Intact protective sensation No prior foot ulcer  No severe deformity Pedal pulses present  High Risk:  Patient has one or more of the following: Loss of protective sensation Absent pedal pulses Severe Foot deformity History of foot ulcer  Plan: {jgplan:23602::"-Patient/POA to call should there be question/concern in the interim."}  Return in about 3 months (around 04/30/2023).  Marzetta Board, DPM

## 2023-03-30 DIAGNOSIS — E1140 Type 2 diabetes mellitus with diabetic neuropathy, unspecified: Secondary | ICD-10-CM | POA: Diagnosis not present

## 2023-03-30 DIAGNOSIS — L603 Nail dystrophy: Secondary | ICD-10-CM | POA: Diagnosis not present

## 2023-03-30 DIAGNOSIS — I739 Peripheral vascular disease, unspecified: Secondary | ICD-10-CM | POA: Diagnosis not present

## 2023-04-28 ENCOUNTER — Other Ambulatory Visit: Payer: Self-pay

## 2023-04-28 ENCOUNTER — Inpatient Hospital Stay (HOSPITAL_COMMUNITY)
Admission: EM | Admit: 2023-04-28 | Discharge: 2023-05-05 | DRG: 177 | Disposition: A | Discharge status: Other Acute Inpatient Hospital | Payer: Medicare HMO | Source: Skilled Nursing Facility | Attending: Internal Medicine | Admitting: Internal Medicine

## 2023-04-28 ENCOUNTER — Inpatient Hospital Stay (HOSPITAL_COMMUNITY): Payer: Medicare HMO

## 2023-04-28 ENCOUNTER — Encounter (HOSPITAL_COMMUNITY): Payer: Self-pay | Admitting: Emergency Medicine

## 2023-04-28 ENCOUNTER — Emergency Department (HOSPITAL_COMMUNITY): Payer: Medicare HMO

## 2023-04-28 DIAGNOSIS — J441 Chronic obstructive pulmonary disease with (acute) exacerbation: Secondary | ICD-10-CM | POA: Diagnosis present

## 2023-04-28 DIAGNOSIS — Z79890 Hormone replacement therapy (postmenopausal): Secondary | ICD-10-CM

## 2023-04-28 DIAGNOSIS — E876 Hypokalemia: Secondary | ICD-10-CM | POA: Diagnosis present

## 2023-04-28 DIAGNOSIS — G9340 Encephalopathy, unspecified: Secondary | ICD-10-CM | POA: Insufficient documentation

## 2023-04-28 DIAGNOSIS — Z7901 Long term (current) use of anticoagulants: Secondary | ICD-10-CM | POA: Diagnosis not present

## 2023-04-28 DIAGNOSIS — R4589 Other symptoms and signs involving emotional state: Secondary | ICD-10-CM

## 2023-04-28 DIAGNOSIS — Z7189 Other specified counseling: Secondary | ICD-10-CM | POA: Diagnosis not present

## 2023-04-28 DIAGNOSIS — K219 Gastro-esophageal reflux disease without esophagitis: Secondary | ICD-10-CM | POA: Diagnosis present

## 2023-04-28 DIAGNOSIS — Z7951 Long term (current) use of inhaled steroids: Secondary | ICD-10-CM

## 2023-04-28 DIAGNOSIS — E039 Hypothyroidism, unspecified: Secondary | ICD-10-CM | POA: Diagnosis present

## 2023-04-28 DIAGNOSIS — Z515 Encounter for palliative care: Secondary | ICD-10-CM | POA: Diagnosis not present

## 2023-04-28 DIAGNOSIS — Z7982 Long term (current) use of aspirin: Secondary | ICD-10-CM

## 2023-04-28 DIAGNOSIS — I5033 Acute on chronic diastolic (congestive) heart failure: Secondary | ICD-10-CM | POA: Diagnosis present

## 2023-04-28 DIAGNOSIS — F0390 Unspecified dementia without behavioral disturbance: Secondary | ICD-10-CM | POA: Diagnosis present

## 2023-04-28 DIAGNOSIS — Z7984 Long term (current) use of oral hypoglycemic drugs: Secondary | ICD-10-CM

## 2023-04-28 DIAGNOSIS — Z8616 Personal history of COVID-19: Secondary | ICD-10-CM | POA: Diagnosis not present

## 2023-04-28 DIAGNOSIS — Z885 Allergy status to narcotic agent status: Secondary | ICD-10-CM

## 2023-04-28 DIAGNOSIS — E785 Hyperlipidemia, unspecified: Secondary | ICD-10-CM | POA: Diagnosis present

## 2023-04-28 DIAGNOSIS — J449 Chronic obstructive pulmonary disease, unspecified: Secondary | ICD-10-CM | POA: Diagnosis not present

## 2023-04-28 DIAGNOSIS — G8929 Other chronic pain: Secondary | ICD-10-CM | POA: Diagnosis present

## 2023-04-28 DIAGNOSIS — Z66 Do not resuscitate: Secondary | ICD-10-CM

## 2023-04-28 DIAGNOSIS — J9621 Acute and chronic respiratory failure with hypoxia: Secondary | ICD-10-CM | POA: Diagnosis present

## 2023-04-28 DIAGNOSIS — E662 Morbid (severe) obesity with alveolar hypoventilation: Secondary | ICD-10-CM | POA: Diagnosis present

## 2023-04-28 DIAGNOSIS — E1122 Type 2 diabetes mellitus with diabetic chronic kidney disease: Secondary | ICD-10-CM | POA: Diagnosis present

## 2023-04-28 DIAGNOSIS — Z6841 Body Mass Index (BMI) 40.0 and over, adult: Secondary | ICD-10-CM

## 2023-04-28 DIAGNOSIS — G9341 Metabolic encephalopathy: Secondary | ICD-10-CM | POA: Diagnosis present

## 2023-04-28 DIAGNOSIS — J690 Pneumonitis due to inhalation of food and vomit: Secondary | ICD-10-CM | POA: Diagnosis not present

## 2023-04-28 DIAGNOSIS — I130 Hypertensive heart and chronic kidney disease with heart failure and stage 1 through stage 4 chronic kidney disease, or unspecified chronic kidney disease: Secondary | ICD-10-CM | POA: Diagnosis present

## 2023-04-28 DIAGNOSIS — J189 Pneumonia, unspecified organism: Secondary | ICD-10-CM | POA: Diagnosis present

## 2023-04-28 DIAGNOSIS — J9622 Acute and chronic respiratory failure with hypercapnia: Secondary | ICD-10-CM | POA: Diagnosis present

## 2023-04-28 DIAGNOSIS — Z1152 Encounter for screening for COVID-19: Secondary | ICD-10-CM | POA: Diagnosis not present

## 2023-04-28 DIAGNOSIS — J9601 Acute respiratory failure with hypoxia: Secondary | ICD-10-CM

## 2023-04-28 DIAGNOSIS — E1165 Type 2 diabetes mellitus with hyperglycemia: Secondary | ICD-10-CM | POA: Diagnosis present

## 2023-04-28 DIAGNOSIS — I2510 Atherosclerotic heart disease of native coronary artery without angina pectoris: Secondary | ICD-10-CM | POA: Diagnosis present

## 2023-04-28 DIAGNOSIS — I509 Heart failure, unspecified: Secondary | ICD-10-CM

## 2023-04-28 DIAGNOSIS — M109 Gout, unspecified: Secondary | ICD-10-CM | POA: Diagnosis present

## 2023-04-28 DIAGNOSIS — N1831 Chronic kidney disease, stage 3a: Secondary | ICD-10-CM | POA: Diagnosis present

## 2023-04-28 DIAGNOSIS — Z794 Long term (current) use of insulin: Secondary | ICD-10-CM

## 2023-04-28 DIAGNOSIS — D631 Anemia in chronic kidney disease: Secondary | ICD-10-CM | POA: Diagnosis present

## 2023-04-28 DIAGNOSIS — Z86711 Personal history of pulmonary embolism: Secondary | ICD-10-CM

## 2023-04-28 DIAGNOSIS — M7989 Other specified soft tissue disorders: Secondary | ICD-10-CM | POA: Diagnosis not present

## 2023-04-28 DIAGNOSIS — Z79899 Other long term (current) drug therapy: Secondary | ICD-10-CM

## 2023-04-28 DIAGNOSIS — Z888 Allergy status to other drugs, medicaments and biological substances status: Secondary | ICD-10-CM

## 2023-04-28 DIAGNOSIS — Z7401 Bed confinement status: Secondary | ICD-10-CM

## 2023-04-28 DIAGNOSIS — Z8249 Family history of ischemic heart disease and other diseases of the circulatory system: Secondary | ICD-10-CM

## 2023-04-28 DIAGNOSIS — J984 Other disorders of lung: Secondary | ICD-10-CM | POA: Diagnosis present

## 2023-04-28 LAB — URINALYSIS, W/ REFLEX TO CULTURE (INFECTION SUSPECTED)
Bilirubin Urine: NEGATIVE
Glucose, UA: >=500 mg/dL — AB
Ketones, ur: NEGATIVE mg/dL
Nitrite: NEGATIVE
Protein, ur: 30 mg/dL — AB
Specific Gravity, Urine: 1.035 — ABNORMAL HIGH (ref 1.005–1.030)
WBC, UA: >50 WBC/hpf (ref 0–5)
pH: 5 (ref 5.0–8.0)

## 2023-04-28 LAB — HEMOGLOBIN A1C
Hgb A1c MFr Bld: 7.3 % — ABNORMAL HIGH (ref 4.8–5.6)
Mean Plasma Glucose: 162.81 mg/dL

## 2023-04-28 LAB — COMPREHENSIVE METABOLIC PANEL
ALT: 13 U/L (ref 0–44)
AST: 16 U/L (ref 15–41)
Albumin: 3.6 g/dL (ref 3.5–5.0)
Alkaline Phosphatase: 28 U/L — ABNORMAL LOW (ref 38–126)
Anion gap: 8 (ref 5–15)
BUN: 25 mg/dL — ABNORMAL HIGH (ref 8–23)
CO2: 36 mmol/L — ABNORMAL HIGH (ref 22–32)
Calcium: 8.6 mg/dL — ABNORMAL LOW (ref 8.9–10.3)
Chloride: 92 mmol/L — ABNORMAL LOW (ref 98–111)
Creatinine, Ser: 0.97 mg/dL (ref 0.44–1.00)
GFR, Estimated: 54 mL/min — ABNORMAL LOW (ref >60)
Glucose, Bld: 159 mg/dL — ABNORMAL HIGH (ref 70–99)
Potassium: 4.4 mmol/L (ref 3.5–5.1)
Sodium: 136 mmol/L (ref 135–145)
Total Bilirubin: 0.3 mg/dL (ref 0.3–1.2)
Total Protein: 7.7 g/dL (ref 6.5–8.1)

## 2023-04-28 LAB — CBC WITH DIFFERENTIAL/PLATELET
Abs Immature Granulocytes: 0.03 10*3/uL (ref 0.00–0.07)
Basophils Absolute: 0 10*3/uL (ref 0.0–0.1)
Basophils Relative: 0 %
Eosinophils Absolute: 0 10*3/uL (ref 0.0–0.5)
Eosinophils Relative: 0 %
HCT: 41.7 % (ref 36.0–46.0)
Hemoglobin: 12 g/dL (ref 12.0–15.0)
Immature Granulocytes: 1 %
Lymphocytes Relative: 12 %
Lymphs Abs: 0.7 10*3/uL (ref 0.7–4.0)
MCH: 30.1 pg (ref 26.0–34.0)
MCHC: 28.8 g/dL — ABNORMAL LOW (ref 30.0–36.0)
MCV: 104.5 fL — ABNORMAL HIGH (ref 80.0–100.0)
Monocytes Absolute: 0.8 10*3/uL (ref 0.1–1.0)
Monocytes Relative: 14 %
Neutro Abs: 4.1 10*3/uL (ref 1.7–7.7)
Neutrophils Relative %: 73 %
Platelets: 182 10*3/uL (ref 150–400)
RBC: 3.99 MIL/uL (ref 3.87–5.11)
RDW: 13.8 % (ref 11.5–15.5)
WBC: 5.6 10*3/uL (ref 4.0–10.5)
nRBC: 0 % (ref 0.0–0.2)

## 2023-04-28 LAB — LACTIC ACID, PLASMA
Lactic Acid, Venous: 0.9 mmol/L (ref 0.5–1.9)
Lactic Acid, Venous: 1.1 mmol/L (ref 0.5–1.9)

## 2023-04-28 LAB — BLOOD GAS, VENOUS
Acid-Base Excess: 11.5 mmol/L — ABNORMAL HIGH (ref 0.0–2.0)
Acid-Base Excess: 13 mmol/L — ABNORMAL HIGH (ref 0.0–2.0)
Bicarbonate: 42.3 mmol/L — ABNORMAL HIGH (ref 20.0–28.0)
Bicarbonate: 42.7 mmol/L — ABNORMAL HIGH (ref 20.0–28.0)
O2 Saturation: 72.5 %
O2 Saturation: 80.3 %
Patient temperature: 37
Patient temperature: 36.6
pCO2, Ven: 90 mmHg (ref 44–60)
pCO2, Ven: 80 mmHg (ref 44–60)
pH, Ven: 7.28 (ref 7.25–7.43)
pH, Ven: 7.34 (ref 7.25–7.43)
pO2, Ven: 45 mmHg (ref 32–45)
pO2, Ven: 47 mmHg — ABNORMAL HIGH (ref 32–45)

## 2023-04-28 LAB — GLUCOSE, CAPILLARY
Glucose-Capillary: 107 mg/dL — ABNORMAL HIGH (ref 70–99)
Glucose-Capillary: 202 mg/dL — ABNORMAL HIGH (ref 70–99)

## 2023-04-28 LAB — MRSA NEXT GEN BY PCR, NASAL: MRSA by PCR Next Gen: NOT DETECTED

## 2023-04-28 LAB — SARS CORONAVIRUS 2 BY RT PCR: SARS Coronavirus 2 by RT PCR: NEGATIVE

## 2023-04-28 LAB — BRAIN NATRIURETIC PEPTIDE: B Natriuretic Peptide: 279.8 pg/mL — ABNORMAL HIGH (ref 0.0–100.0)

## 2023-04-28 LAB — STREP PNEUMONIAE URINARY ANTIGEN: Strep Pneumo Urinary Antigen: NEGATIVE

## 2023-04-28 MED ORDER — ACETAMINOPHEN 650 MG RE SUPP
650.0000 mg | Freq: Four times a day (QID) | RECTAL | Status: DC | PRN
  Administered 2023-04-28: 650 mg via RECTAL
  Filled 2023-04-28: qty 1

## 2023-04-28 MED ORDER — INSULIN ASPART 100 UNIT/ML IJ SOLN
0.0000 [IU] | Freq: Three times a day (TID) | INTRAMUSCULAR | Status: DC
  Administered 2023-04-29 (×3): 3 [IU] via SUBCUTANEOUS
  Administered 2023-04-30: 5 [IU] via SUBCUTANEOUS
  Administered 2023-04-30: 3 [IU] via SUBCUTANEOUS
  Administered 2023-04-30 – 2023-05-01 (×2): 5 [IU] via SUBCUTANEOUS
  Administered 2023-05-01 (×2): 8 [IU] via SUBCUTANEOUS
  Administered 2023-05-02: 2 [IU] via SUBCUTANEOUS
  Administered 2023-05-02: 5 [IU] via SUBCUTANEOUS
  Administered 2023-05-02: 8 [IU] via SUBCUTANEOUS
  Administered 2023-05-03: 2 [IU] via SUBCUTANEOUS
  Administered 2023-05-03: 3 [IU] via SUBCUTANEOUS
  Administered 2023-05-03: 2 [IU] via SUBCUTANEOUS
  Administered 2023-05-04: 3 [IU] via SUBCUTANEOUS
  Administered 2023-05-04: 5 [IU] via SUBCUTANEOUS
  Administered 2023-05-04: 2 [IU] via SUBCUTANEOUS
  Filled 2023-04-28: qty 0.15

## 2023-04-28 MED ORDER — UMECLIDINIUM-VILANTEROL 62.5-25 MCG/ACT IN AEPB
1.0000 | INHALATION_SPRAY | Freq: Every day | RESPIRATORY_TRACT | Status: DC
  Filled 2023-04-28: qty 14

## 2023-04-28 MED ORDER — LEVOTHYROXINE SODIUM 25 MCG PO TABS
125.0000 ug | ORAL_TABLET | Freq: Every day | ORAL | Status: DC
  Administered 2023-04-30 – 2023-05-05 (×6): 125 ug via ORAL
  Filled 2023-04-28 (×6): qty 1

## 2023-04-28 MED ORDER — MAGNESIUM SULFATE IN D5W 1-5 GM/100ML-% IV SOLN
1.0000 g | Freq: Once | INTRAVENOUS | Status: AC
  Administered 2023-04-28: 1 g via INTRAVENOUS
  Filled 2023-04-28: qty 100

## 2023-04-28 MED ORDER — PANTOPRAZOLE SODIUM 40 MG PO TBEC: 40.0000 mg | DELAYED_RELEASE_TABLET | Freq: Every day | ORAL | Status: DC

## 2023-04-28 MED ORDER — PSYLLIUM 0.52 G PO CAPS: 0.5200 g | ORAL_CAPSULE | Freq: Every day | ORAL | Status: DC

## 2023-04-28 MED ORDER — POLYETHYLENE GLYCOL 3350 17 G PO PACK
17.0000 g | PACK | Freq: Two times a day (BID) | ORAL | Status: DC
  Administered 2023-04-30 – 2023-05-05 (×5): 17 g via ORAL
  Filled 2023-04-28 (×6): qty 1

## 2023-04-28 MED ORDER — FUROSEMIDE 10 MG/ML IJ SOLN: 40.0000 mg | Freq: Once | INTRAMUSCULAR | Status: DC

## 2023-04-28 MED ORDER — PIPERACILLIN-TAZOBACTAM 3.375 G IVPB
3.3750 g | Freq: Three times a day (TID) | INTRAVENOUS | Status: DC
  Administered 2023-04-28 – 2023-05-05 (×20): 3.375 g via INTRAVENOUS
  Filled 2023-04-28 (×21): qty 50

## 2023-04-28 MED ORDER — DOXAZOSIN MESYLATE 1 MG PO TABS: 1.0000 mg | ORAL_TABLET | Freq: Every day | ORAL | Status: DC

## 2023-04-28 MED ORDER — BUDESONIDE 0.5 MG/2ML IN SUSP: 0.5000 mg | Freq: Two times a day (BID) | RESPIRATORY_TRACT | Status: DC

## 2023-04-28 MED ORDER — GUAIFENESIN ER 600 MG PO TB12: 1200.0000 mg | ORAL_TABLET | Freq: Two times a day (BID) | ORAL | Status: DC

## 2023-04-28 MED ORDER — ESCITALOPRAM OXALATE 10 MG PO TABS: 10.0000 mg | ORAL_TABLET | Freq: Every day | ORAL | Status: DC

## 2023-04-28 MED ORDER — ASPIRIN 81 MG PO CHEW
81.0000 mg | CHEWABLE_TABLET | Freq: Every day | ORAL | Status: DC
  Administered 2023-04-30 – 2023-05-05 (×6): 81 mg via ORAL
  Filled 2023-04-28 (×6): qty 1

## 2023-04-28 MED ORDER — FUROSEMIDE 10 MG/ML IJ SOLN
60.0000 mg | Freq: Two times a day (BID) | INTRAMUSCULAR | Status: DC
  Administered 2023-04-28: 60 mg via INTRAVENOUS
  Filled 2023-04-28: qty 6

## 2023-04-28 MED ORDER — SODIUM CHLORIDE 0.9 % IV SOLN
100.0000 mg | Freq: Two times a day (BID) | INTRAVENOUS | Status: DC
  Administered 2023-04-29 – 2023-04-30 (×4): 100 mg via INTRAVENOUS
  Filled 2023-04-28 (×5): qty 100

## 2023-04-28 MED ORDER — METHYLPREDNISOLONE SODIUM SUCC 125 MG IJ SOLR
125.0000 mg | Freq: Once | INTRAMUSCULAR | Status: AC
  Administered 2023-04-28: 125 mg via INTRAVENOUS
  Filled 2023-04-28: qty 2

## 2023-04-28 MED ORDER — PANTOPRAZOLE SODIUM 40 MG PO TBEC
40.0000 mg | DELAYED_RELEASE_TABLET | Freq: Every day | ORAL | Status: DC
  Administered 2023-04-30 – 2023-05-05 (×6): 40 mg via ORAL
  Filled 2023-04-28 (×6): qty 1

## 2023-04-28 MED ORDER — SODIUM CHLORIDE 0.9 % IV SOLN
2.0000 g | Freq: Once | INTRAVENOUS | Status: AC
  Administered 2023-04-28: 2 g via INTRAVENOUS
  Filled 2023-04-28: qty 12.5

## 2023-04-28 MED ORDER — ESCITALOPRAM OXALATE 10 MG PO TABS
10.0000 mg | ORAL_TABLET | Freq: Every day | ORAL | Status: DC
  Administered 2023-04-30 – 2023-05-05 (×6): 10 mg via ORAL
  Filled 2023-04-28 (×7): qty 1

## 2023-04-28 MED ORDER — BUDESONIDE 0.5 MG/2ML IN SUSP
0.5000 mg | Freq: Two times a day (BID) | RESPIRATORY_TRACT | Status: DC
  Administered 2023-04-28 – 2023-05-05 (×14): 0.5 mg via RESPIRATORY_TRACT
  Filled 2023-04-28 (×14): qty 2

## 2023-04-28 MED ORDER — ACETAZOLAMIDE SODIUM 500 MG IJ SOLR
500.0000 mg | Freq: Once | INTRAMUSCULAR | Status: AC
  Administered 2023-04-28: 500 mg via INTRAVENOUS
  Filled 2023-04-28: qty 500

## 2023-04-28 MED ORDER — SENNA 8.6 MG PO TABS
2.0000 | ORAL_TABLET | Freq: Two times a day (BID) | ORAL | Status: DC
  Administered 2023-04-30 – 2023-05-05 (×5): 17.2 mg via ORAL
  Filled 2023-04-28 (×9): qty 2

## 2023-04-28 MED ORDER — CYCLOBENZAPRINE HCL 10 MG PO TABS: 5.0000 mg | ORAL_TABLET | Freq: Three times a day (TID) | ORAL | Status: DC

## 2023-04-28 MED ORDER — GUAIFENESIN ER 600 MG PO TB12
1200.0000 mg | ORAL_TABLET | Freq: Two times a day (BID) | ORAL | Status: DC
  Administered 2023-04-29 – 2023-05-03 (×9): 1200 mg via ORAL
  Filled 2023-04-28 (×10): qty 2

## 2023-04-28 MED ORDER — PIPERACILLIN-TAZOBACTAM 3.375 G IVPB 30 MIN
3.3750 g | Freq: Once | INTRAVENOUS | Status: AC
  Administered 2023-04-28: 3.375 g via INTRAVENOUS
  Filled 2023-04-28: qty 50

## 2023-04-28 MED ORDER — DORZOLAMIDE HCL 2 % OP SOLN
1.0000 [drp] | Freq: Two times a day (BID) | OPHTHALMIC | Status: DC
  Administered 2023-04-29 – 2023-05-05 (×13): 1 [drp] via OPHTHALMIC
  Filled 2023-04-28: qty 10

## 2023-04-28 MED ORDER — IPRATROPIUM-ALBUTEROL 0.5-2.5 (3) MG/3ML IN SOLN
3.0000 mL | Freq: Four times a day (QID) | RESPIRATORY_TRACT | Status: DC
  Administered 2023-04-28 – 2023-04-29 (×6): 3 mL via RESPIRATORY_TRACT
  Filled 2023-04-28 (×6): qty 3

## 2023-04-28 MED ORDER — FLUTICASONE PROPIONATE 50 MCG/ACT NA SUSP
2.0000 | Freq: Every day | NASAL | Status: DC
  Administered 2023-04-29 – 2023-05-05 (×7): 2 via NASAL
  Filled 2023-04-28: qty 16

## 2023-04-28 MED ORDER — LABETALOL HCL 5 MG/ML IV SOLN
20.0000 mg | INTRAVENOUS | Status: DC | PRN
  Administered 2023-04-28 – 2023-05-04 (×12): 20 mg via INTRAVENOUS
  Filled 2023-04-28 (×12): qty 4

## 2023-04-28 MED ORDER — GABAPENTIN 300 MG PO CAPS
300.0000 mg | ORAL_CAPSULE | Freq: Two times a day (BID) | ORAL | Status: DC
  Administered 2023-04-29 – 2023-05-05 (×12): 300 mg via ORAL
  Filled 2023-04-28 (×12): qty 1

## 2023-04-28 MED ORDER — CHLORHEXIDINE GLUCONATE CLOTH 2 % EX PADS
6.0000 | MEDICATED_PAD | Freq: Every day | CUTANEOUS | Status: DC
  Administered 2023-04-28 – 2023-05-05 (×5): 6 via TOPICAL

## 2023-04-28 MED ORDER — ACETAMINOPHEN 500 MG PO TABS: 1000.0000 mg | ORAL_TABLET | Freq: Three times a day (TID) | ORAL | Status: DC

## 2023-04-28 MED ORDER — HEPARIN SODIUM (PORCINE) 5000 UNIT/ML IJ SOLN
5000.0000 [IU] | Freq: Two times a day (BID) | INTRAMUSCULAR | Status: DC
  Administered 2023-04-28 – 2023-05-05 (×14): 5000 [IU] via SUBCUTANEOUS
  Filled 2023-04-28 (×14): qty 1

## 2023-04-28 MED ORDER — IOHEXOL 350 MG/ML SOLN
100.0000 mL | Freq: Once | INTRAVENOUS | Status: AC | PRN
  Administered 2023-04-28: 100 mL via INTRAVENOUS

## 2023-04-28 MED ORDER — VANCOMYCIN HCL IN DEXTROSE 1-5 GM/200ML-% IV SOLN
1000.0000 mg | Freq: Once | INTRAVENOUS | Status: AC
  Administered 2023-04-28: 1000 mg via INTRAVENOUS
  Filled 2023-04-28: qty 200

## 2023-04-28 MED ORDER — METHYLPREDNISOLONE SODIUM SUCC 40 MG IJ SOLR
40.0000 mg | Freq: Three times a day (TID) | INTRAMUSCULAR | Status: DC
  Administered 2023-04-28 – 2023-05-01 (×8): 40 mg via INTRAVENOUS
  Filled 2023-04-28 (×8): qty 1

## 2023-04-28 MED ORDER — ONDANSETRON HCL 4 MG/2ML IJ SOLN: 4.0000 mg | Freq: Four times a day (QID) | INTRAMUSCULAR | Status: DC | PRN

## 2023-04-28 MED ORDER — ALBUTEROL SULFATE (2.5 MG/3ML) 0.083% IN NEBU: 2.5000 mg | INHALATION_SOLUTION | RESPIRATORY_TRACT | Status: DC | PRN

## 2023-04-28 MED ORDER — ORAL CARE MOUTH RINSE: 15.0000 mL | OROMUCOSAL | Status: DC | PRN

## 2023-04-28 NOTE — Progress Notes (Signed)
A consult was received from an ED physician for vancomycin per pharmacy dosing.  The patient's profile has been reviewed for ht/wt/allergies/indication/available labs.    STAT Ht/wt order entered. No recent weight documented on file for weight-based loading dose.   A one time order has been placed for vancomycin 1000 mg IV once.  Further antibiotics/pharmacy consults should be ordered by admitting physician if indicated.                       Thank you, Cindi Carbon, PharmD, BCPS 04/28/2023  11:16 AM

## 2023-04-28 NOTE — Progress Notes (Signed)
Pharmacy Antibiotic Note  Debbie Arnold is a 89 y.o. female admitted on 04/28/2023 with pneumonia.  Pharmacy has been consulted for Zosyn dosing.  Active Problem(s): SOB from facility with low sats. PNA exacerbation for the past 3 months that "comes and goes." PO ABT Azithromycin with no improvement   ZOX:WRUE, DM, gout, HF, HTN, h/o PE, CKD, thyroid dz  ID: Aspiration PNA. Dr. Chipper Herb wants PSA coverage - tmax 99.9, WBC WNL, Scr <1  Azithro PTA Doxy 5/21>> Zosyn 5/21>>  Plan: Zosyn 3.375g IV q8hr Pharmacy will sign off. Please reconsult for further dosing assitance.      Temp (24hrs), Avg:99.9 F (37.7 C), Min:99.9 F (37.7 C), Max:99.9 F (37.7 C)  Recent Labs  Lab 04/28/23 1056  WBC 5.6  CREATININE 0.97  LATICACIDVEN 0.9    CrCl cannot be calculated (Unknown ideal weight.).    Allergies  Allergen Reactions   Brimonidine Itching    Not listed on med list from [facility]   Celecoxib Other (See Comments)   Red Dye Other (See Comments)    Not listed on med list from [facility]   Codeine Anxiety    Debbie Arnold, PharmD, BCPS Clinical Staff Pharmacist [email or web address]  Debbie Arnold 04/28/2023 1:29 PM

## 2023-04-28 NOTE — Progress Notes (Signed)
VBG reviewed, 7.28/90/45, CT chest showed multifocal infiltrates compatible with multifocal pneumonia.  Discussed with on-call PCCM attending at bedside along with Dr. Vassie Loll, who recommend as long as patient blood gas is compensated with normal pH, no indication for immediate BiPAP.  PCCM does recommend a chest BiPAP.  However given patient's poor ventilatory status, expect poor compliance with BiPAP. D/W nurse at [facility] that as well as patient comfortable on current oxygen setting, will continue the high flow.  Currently patient O2 saturation 100% on 7 L and nurse reported the patient appears to be comfortable with current high flow oxygen.

## 2023-04-28 NOTE — Plan of Care (Signed)

## 2023-04-28 NOTE — ED Triage Notes (Signed)
Pt BIB EMS from [facility] in [place], c/o SOB. Per staff, pt has had PNA exacerbation for the past 3 months that "comes and goes." PO ABT Azithromycin with no improvement. Nonproductive cough, previous UTI treatment x 2 weeks ago. Rt upper lower lobes noted bronchi. Nasal cannula placed 6 liters sats unable to improve highest 86%. Placed on nonrebreather, sats 97%. Alert and oriented x4  BP 157/63 P 90 RR 22 CBG 178

## 2023-04-28 NOTE — ED Notes (Signed)
Sats noted 86 on 7 liters, Nonrebreather placed at this time.

## 2023-04-28 NOTE — H&P (Addendum)
History and Physical    Debbie Arnold:[id number] DOB: [birth year] DOA: 04/28/2023  PCP: Martyn Malay, MD (Confirm with patient/family/NH records and if not entered, this has to be entered at [facility] point of entry) Patient coming from: SNF  I have personally briefly reviewed patient's old medical records in [facility]  Chief Complaint: Patient is somnolent  HPI: Debbie Arnold is a 89 y.o. female with medical history significant of COPD, recurrent multifocal pneumonia, restrictive lung disease, IDDM, HFpEF, HTN, remote history of PE with anticoagulation treatment, CKD stage IIIa, hypothyroidism, anxiety/depression, GERD, sent from nursing home for evaluation of worsening of hypoxia and mentation changes.  Somnolent, unable to answer any questions, all history provided by son at bedside.  Son reported that the patient has been having breathing problems for about 1 month with productive cough yellowish sputum, wheezing and was treated for pneumonia last week with azithromycin with no significant improvement.  Denies any fever or chills.  Overnight patient became somnolent and nursing home staff decided to send her to ED.  Patient family does report the patient has occasional choking on cough after eating regular texture food however patient continues to eat regular textured food at nursing home.  Son unsure about history of aspiration pneumonia.  Last 1 week patient has been receiving continuous breathing treatment bronchodilator almost every day and her breathing symptoms are all improving.  ED Course: Temperature 99.9, pulse is 88, blood pressure 144/65, O2 saturation initially was lower 90s on 4 L and still degenerated requiring 15 L with the O2 saturation 98%.  Chest x-ray showed multifocal bilateral pneumonia.  Blood work showed hemoglobin 12, creatinine 0.9, bicarb 36 glucose 159.  Patient was given breathing treatment and vancomycin and cefepime.  Review of Systems: Unable to perform,  patient is somnolent.  Past Medical History:  Diagnosis Date   COPD (chronic obstructive pulmonary disease) (HCC)    Diabetes mellitus without complication (HCC)    Gout    HFrEF (heart failure with reduced ejection fraction) (HCC)    40-45%, mild MR, mod TR   Hypertension    Pulmonary embolism (H[facility])    Stage III chronic kidney disease (H[facility])    Thyroid disease     History reviewed. No pertinent surgical history.   reports that she has never smoked. She has never used smokeless tobacco. She reports that she does not drink alcohol and does not use drugs.  Allergies  Allergen Reactions   Brimonidine Itching    Not listed on med list from [facility]   Celecoxib Other (See Comments)   Red Dye Other (See Comments)    Not listed on med list from [facility]   Codeine Anxiety    History reviewed. No pertinent family history.   Prior to Admission medications   Medication Sig Start Date End Date Taking? Authorizing Provider  acetaminophen (TYLENOL) 500 MG tablet Take 1,000 mg by mouth 3 (three) times daily.    [provider]  albuterol (PROVENTIL HFA;VENTOLIN HFA) 108 (90 BASE) MCG/ACT inhaler Inhale 2 puffs into the lungs every 4 (four) hours as needed for wheezing or shortness of breath.    [provider]  aspirin 81 MG chewable tablet Take 81 mg by mouth daily.    [provider]  budesonide (PULMICORT) 0.5 MG/2ML nebulizer solution Take 0.5 mg by nebulization 2 (two) times daily.    [provider]  cyclobenzaprine (FLEXERIL) 5 MG tablet Take 5 mg by mouth 3 (three) times daily.  [provider]  diclofenac Sodium (VOLTAREN) 1 % GEL Apply 2-4 g topically in the morning and at bedtime. Left shoulder/arm    [provider]  dorzolamide (TRUSOPT) 2 % ophthalmic solution Place 1 drop into both eyes 2 (two) times daily. 11/05/17   [provider]  doxazosin (CARDURA) 1 MG tablet Take 1 mg by mouth daily.     [provider]  empagliflozin (JARDIANCE) 10 MG TABS tablet Take 1 tablet (10 mg total) by mouth daily. 01/08/22   Arrien, York Ram, MD  escitalopram (LEXAPRO) 10 MG tablet Take 10 mg by mouth daily.    [provider]  fluticasone (FLONASE) 50 MCG/ACT nasal spray Place 2 sprays into both nostrils in the morning and at bedtime.    [provider]  folic acid (FOLVITE) 1 MG tablet Take 5 mg by mouth daily.    [provider]  furosemide (LASIX) 40 MG tablet Take 1.5 tablets (60 mg total) by mouth daily. 01/07/22   Arrien, York Ram, MD  gabapentin (NEURONTIN) 100 MG capsule Take 300 mg by mouth 2 (two) times daily.    [provider]  glipiZIDE (GLUCOTROL) 5 MG tablet Take 5 mg by mouth daily before breakfast.    [provider]  guaiFENesin (MUCINEX) 600 MG 12 hr tablet Take 1,200 mg by mouth 2 (two) times daily. (Every 12 hours)    [provider]  levothyroxine (SYNTHROID) 125 MCG tablet Take 125 mcg by mouth daily before breakfast.    [provider]  losartan (COZAAR) 100 MG tablet Take 100 mg by mouth daily.    [provider]  magnesium citrate SOLN Take 150 mLs by mouth every 12 (twelve) hours.    [provider]  Menthol-Methyl Salicylate (SALONPAS PAIN RELIEF PATCH EX) Apply 1 patch topically See admin instructions. Apply to left shoulder every 12 hours as needed for pain. Leave on for 12 hours, then off for 12 hours    [provider]  metoprolol succinate (TOPROL-XL) 50 MG 24 hr tablet Take 1 tablet (50 mg total) by mouth daily. Take with or immediately following a meal. 01/08/22 02/25/22  Arrien, York Ram, MD  omeprazole (PRILOSEC) 40 MG capsule Take 40 mg by mouth daily.    [provider]  polyethylene glycol powder (GLYCOLAX/MIRALAX) 17 GM/SCOOP powder Take 1 Container by mouth See admin instructions. 1 scoop bid    [provider]  psyllium (REGULOID)  0.52 g capsule Take 0.52 g by mouth at bedtime.    [provider]  senna (SENOKOT) 8.6 MG tablet Take 2 tablets by mouth 2 (two) times daily.    [provider]  umeclidinium-vilanterol (ANORO ELLIPTA) 62.5-25 MCG/ACT AEPB Inhale 1 puff into the lungs daily.    [provider]    Physical Exam: Vitals:   04/28/23 1045 04/28/23 1051  BP: (!) 144/65   Pulse: 88   Resp: 20   Temp: 99.9 F (37.7 C)   TempSrc: Oral   SpO2: 98% 97%    Constitutional: NAD, calm, comfortable Vitals:   04/28/23 1045 04/28/23 1051  BP: (!) 144/65   Pulse: 88   Resp: 20   Temp: 99.9 F (37.7 C)   TempSrc: Oral   SpO2: 98% 97%   Eyes: PERRL, lids and conjunctivae normal ENMT: Mucous membranes are moist. Posterior pharynx clear of any exudate or lesions.Normal dentition.  Neck: normal, supple, no masses, no thyromegaly Respiratory: Diminished breathing sound bilaterally, diffused wheezing bilaterally, scattered  crackles on bilateral lower fields, increasing breathing effort no accessory muscle use.  Cardiovascular: Regular rate and rhythm, no murmurs / rubs / gallops.  1+ extremity edema. 2+ pedal pulses. No carotid bruits.  Abdomen: no tenderness, no masses palpated. No hepatosplenomegaly. Bowel sounds positive.  Musculoskeletal: no clubbing / cyanosis. No joint deformity upper and lower extremities. Good ROM, no contractures. Normal muscle tone.  Skin: no rashes, lesions, ulcers. No induration Neurologic: No facial droops, moving all limbs responded to painful stimuli Psychiatric: Somnolent, responded to painful stimuli   Labs on Admission: I have personally reviewed following labs and imaging studies  CBC: Recent Labs  Lab 04/28/23 1056  WBC 5.6  NEUTROABS 4.1  HGB 12.0  HCT 41.7  MCV 104.5*  PLT 182   Basic Metabolic Panel: Recent Labs  Lab 04/28/23 1056  NA 136  K 4.4  CL 92*  CO2 36*  GLUCOSE 159*  BUN 25*  CREATININE 0.97  CALCIUM 8.6*    GFR: CrCl cannot be calculated (Unknown ideal weight.). Liver Function Tests: Recent Labs  Lab 04/28/23 1056  AST 16  ALT 13  ALKPHOS 28*  BILITOT 0.3  PROT 7.7  ALBUMIN 3.6   No results for input(s): "LIPASE", "AMYLASE" in the last 168 hours. No results for input(s): "AMMONIA" in the last 168 hours. Coagulation Profile: No results for input(s): "INR", "PROTIME" in the last 168 hours. Cardiac Enzymes: No results for input(s): "CKTOTAL", "CKMB", "CKMBINDEX", "TROPONINI" in the last 168 hours. BNP (last 3 results) No results for input(s): "PROBNP" in the last 8760 hours. HbA1C: No results for input(s): "HGBA1C" in the last 72 hours. CBG: No results for input(s): "GLUCAP" in the last 168 hours. Lipid Profile: No results for input(s): "CHOL", "HDL", "LDLCALC", "TRIG", "CHOLHDL", "LDLDIRECT" in the last 72 hours. Thyroid Function Tests: No results for input(s): "TSH", "T4TOTAL", "FREET4", "T3FREE", "THYROIDAB" in the last 72 hours. Anemia Panel: No results for input(s): "VITAMINB12", "FOLATE", "FERRITIN", "TIBC", "IRON", "RETICCTPCT" in the last 72 hours. Urine analysis:    Component Value Date/Time   COLORURINE YELLOW 12/30/2021 0735   APPEARANCEUR CLOUDY (A) 12/30/2021 0735   LABSPEC 1.010 12/30/2021 0735   PHURINE 7.0 12/30/2021 0735   GLUCOSEU NEGATIVE 12/30/2021 0735   HGBUR MODERATE (A) 12/30/2021 0735   BILIRUBINUR NEGATIVE 12/30/2021 0735   KETONESUR NEGATIVE 12/30/2021 0735   PROTEINUR NEGATIVE 12/30/2021 0735   NITRITE POSITIVE (A) 12/30/2021 0735   LEUKOCYTESUR MODERATE (A) 12/30/2021 0735    Radiological Exams on Admission: DG Chest Port 1 View  Result Date: 04/28/2023 CLINICAL DATA:  Evaluate for abnormality.  Possible sepsis. EXAM: PORTABLE CHEST 1 VIEW COMPARISON:  02/25/2022 FINDINGS: Cardiac enlargement. Aortic atherosclerotic calcifications. Bilateral upper lobe peripheral opacities are new when compared with the previous exam. There is also a new  opacity identified within the right lung base. No signs of pleural effusion or edema. Previous right shoulder arthroplasty. IMPRESSION: New bilateral upper lobe peripheral opacities and right lung base opacity compatible with multifocal pneumonia. Followup PA and lateral chest X-ray is recommended in 3-4 weeks following trial of antibiotic therapy to ensure resolution and exclude underlying malignancy. Electronically Signed   By: Signa Kell M.D.   On: 04/28/2023 12:08    EKG: Independently reviewed.  Sinus rhythm, no acute ST changes, chronic RBBB  Assessment/Plan Principal Problem:   PNA (pneumonia) Active Problems:   COPD (chronic obstructive pulmonary disease) (HCC)   Acute on chronic diastolic CHF (congestive heart failure) (HCC)   CHF (congestive heart failure) (HCC)  Multifocal pneumonia   Acute encephalopathy  (please populate well all problems here in Problem List. (For example, if patient is on BP meds at home and you resume or decide to hold them, it is a problem that needs to be her. Same for CAD, COPD, HLD and so on)  Acute hypoxic, and possibly hypercapnic respiratory failure -Likely secondary to multiple factors including multifocal/aspiration pneumonia, acute COPD exacerbation and possibly acute on chronic HFpEF decompensation. -Clinically suspect multifocal pneumonia/aspiration pneumonia, change antibiotic coverage to Zosyn plus doxycycline.  CT chest pending -Long discussion with son at bedside regarding patient's prognosis.  As of now, short-term prognosis and long-term prognosis both poor.  At baseline patient has poor lung function, only spirometry/PFT on care everywhere was in 2021 at [facility] which showed reduced FVC and FEV1 without evidence of airflow obstruction, suggestive of mild to moderate restriction.  And appears to be that as early as 2021 patient CT chest already showed bilateral infiltrates, reported as multifocal pneumonia versus scarring.  Her later CT  chest or reported as bilateral multifocal infiltrates compatible with scarring/restrictive lung disease.  On top of that now patient has recurrent multifocal pneumonia failed outpatient antibiotics management, estimated patient is a poor candidate for aggressive management such as intubation.  Family agreed with continued DNR/DNI.  Son agreed with treatment plan and understand if patient does not make significant progress in the next 24-48 hours, will consider palliative care. -Admit to stepdown unit for close monitor breathing status -N.p.o. and speech evaluation tomorrow -Check sputum culture, atypical pneumonia study  Acute COPD exacerbation -Elevated bicarb level, suspect acute respiratory acidosis.  May need BiPAP support.  Check VBG to rule out CO2 retention -IV Diamox given -Continue DuoNebs, as needed albuterol, -Dose of IV Solu-Medrol given, 1 dose of IV magnesium 1 g given. -Continue ICS and LABA -Solu-Medrol every 8 hours -Incentive spirometry  Suspect acute on chronic HFpEF decompensation -Change p.o. Lasix to IV Lasix 60 mg twice daily -Follow-up CT chest for volume status estimation  Acute metabolic encephalopathy GCS=9 -Secondary to acute hypoxic and probably hypercapnic respiratory failure and pneumonia and CHF decompensation -Check VBG  HTN -Hold all p.o. medication, start as needed labetalol  IDDM -SSI for now, hold off PO DM meds  DVT prophylaxis: Heparin subcu Code Status: DNR Family Communication: Son/POA at bedside Disposition Plan: Patient is sick with Consults called: None Admission status: Stepdown unit   Emeline General MD [facility] Pager [phone]  04/28/2023, 1:18 PM

## 2023-04-28 NOTE — ED Provider Notes (Signed)
[place] EMERGENCY DEPARTMENT AT [facility] Provider Note   CSN: [id number] Arrival date & time: 04/28/23  1035     History  Chief Complaint  Patient presents with   Shortness of Breath    Debbie Arnold is a 89 y.o. female.  HPI   89 year old female with medical history significant for recurrent pneumonia recently treated with oral azithromycin who [facility] with concern for respiratory failure.  The patient has had a nonproductive cough with rhonchi located in the lower lobes.  Was found to be hypoxic with EMS and was placed on 6 L and unable to improve saturations above 86%.  Subsequently placed on nonrebreather with improvement in saturations to 97%.  Worsening over the past few days.  No known fevers at home.  Remainder of history is limited by the patient's dementia.  Home Medications Prior to Admission medications   Medication Sig Start Date End Date Taking? Authorizing Provider  acetaminophen (TYLENOL) 500 MG tablet Take 1,000 mg by mouth 3 (three) times daily.   Yes [provider]  albuterol (PROVENTIL HFA;VENTOLIN HFA) 108 (90 BASE) MCG/ACT inhaler Inhale 2 puffs into the lungs every 4 (four) hours as needed for wheezing or shortness of breath.   Yes [provider]  aspirin 81 MG chewable tablet Take 81 mg by mouth daily.   Yes [provider]  azithromycin (ZITHROMAX) 250 MG tablet Take 250 mg by mouth daily. 04/27/23  Yes [provider]  budesonide (PULMICORT) 0.5 MG/2ML nebulizer solution Take 0.5 mg by nebulization 2 (two) times daily.   Yes [provider]  diclofenac Sodium (VOLTAREN) 1 % GEL Apply 2-4 g topically in the morning and at bedtime. Left shoulder/arm   Yes [provider]  dorzolamide (TRUSOPT) 2 % ophthalmic solution Place 1 drop into both eyes 3 (three) times daily. 11/05/17  Yes [provider]  doxazosin (CARDURA) 1 MG tablet Take 1 mg by mouth daily.    Yes [provider]  empagliflozin (JARDIANCE) 10 MG TABS tablet Take 1 tablet (10 mg total) by mouth daily. 01/08/22  Yes Arrien, York Ram, MD  escitalopram (LEXAPRO) 10 MG tablet Take 10 mg by mouth daily.   Yes [provider]  fluticasone (FLONASE) 50 MCG/ACT nasal spray Place 2 sprays into both nostrils in the morning and at bedtime.   Yes [provider]  folic acid (FOLVITE) 1 MG tablet Take 5 mg by mouth daily.   Yes [provider]  furosemide (LASIX) 40 MG tablet Take 1.5 tablets (60 mg total) by mouth daily. 01/07/22  Yes Arrien, York Ram, MD  gabapentin (NEURONTIN) 100 MG capsule Take 300 mg by mouth 2 (two) times daily.   Yes [provider]  guaiFENesin (MUCINEX) 600 MG 12 hr tablet Take 1,200 mg by mouth 2 (two) times daily. (Every 12 hours)   Yes [provider]  levothyroxine (SYNTHROID) 125 MCG tablet Take 125 mcg by mouth daily before breakfast.   Yes [provider]  losartan (COZAAR) 100 MG tablet Take 100 mg by mouth daily.   Yes [provider]  Menthol-Methyl Salicylate (SALONPAS PAIN RELIEF PATCH EX) Apply 1 patch topically See admin instructions. Apply to left shoulder every 12 hours as needed for pain. Leave on for 12 hours, then off for 12 hours   Yes [provider]  metoprolol succinate (TOPROL-XL) 50 MG 24 hr tablet Take 1 tablet (50 mg total) by mouth daily. Take with  or immediately following a meal. 01/08/22 04/28/23 Yes Arrien, York Ram, MD  omeprazole (PRILOSEC) 40 MG capsule Take 40 mg by mouth daily.   Yes [provider]  polyethylene glycol powder (GLYCOLAX/MIRALAX) 17 GM/SCOOP powder Take 1 Container by mouth See admin instructions. 1 scoop bid   Yes [provider]  senna (SENOKOT) 8.6 MG tablet Take 2 tablets by mouth 2 (two) times daily.   Yes [provider]  umeclidinium-vilanterol (ANORO ELLIPTA) 62.5-25 MCG/ACT AEPB Inhale 1 puff  into the lungs daily.   Yes [provider]  cyclobenzaprine (FLEXERIL) 5 MG tablet Take 5 mg by mouth 3 (three) times daily. Patient not taking: Reported on 04/28/2023    [provider]  magnesium citrate SOLN Take 150 mLs by mouth every 12 (twelve) hours. Patient not taking: Reported on 04/28/2023    [provider]  psyllium (REGULOID) 0.52 g capsule Take 0.52 g by mouth at bedtime. Patient not taking: Reported on 04/28/2023    [provider]      Allergies    Brimonidine, Celecoxib, Red dye, and Codeine    Review of Systems   Review of Systems  Unable to perform ROS: Dementia    Physical Exam Updated Vital Signs BP (!) 211/76   Pulse 85   Temp 98.6 F (37 C) (Oral)   Resp 20   Ht 5\' 5"  (1.651 m)   Wt 126.3 kg   SpO2 100%   BMI 46.34 kg/m  Physical Exam Vitals and nursing note reviewed.  Constitutional:      General: She is not in acute distress.    Appearance: She is well-developed. She is ill-appearing.  HENT:     Head: Normocephalic and atraumatic.  Eyes:     Conjunctiva/sclera: Conjunctivae normal.  Cardiovascular:     Rate and Rhythm: Normal rate and regular rhythm.     Heart sounds: No murmur heard. Pulmonary:     Effort: Pulmonary effort is normal. Tachypnea present. No respiratory distress.     Breath sounds: Decreased breath sounds and rhonchi present.     Comments: NRB in place saturating well Abdominal:     Palpations: Abdomen is soft.     Tenderness: There is no abdominal tenderness.  Musculoskeletal:        General: No swelling.     Cervical back: Neck supple.  Skin:    General: Skin is warm and dry.     Capillary Refill: Capillary refill takes less than 2 seconds.  Neurological:     Mental Status: She is alert.  Psychiatric:        Mood and Affect: Mood normal.     ED Results / Procedures / Treatments   Labs (all labs ordered are listed, but only abnormal results are displayed) Labs Reviewed   COMPREHENSIVE METABOLIC PANEL - Abnormal; Notable for the following components:      Result Value   Chloride 92 (*)    CO2 36 (*)    Glucose, Bld 159 (*)    BUN 25 (*)    Calcium 8.6 (*)    Alkaline Phosphatase 28 (*)    GFR, Estimated 54 (*)    All other components within normal limits  CBC WITH DIFFERENTIAL/PLATELET - Abnormal; Notable for the following components:   MCV 104.5 (*)    MCHC 28.8 (*)    All other components within normal limits  URINALYSIS, W/ REFLEX TO CULTURE (INFECTION SUSPECTED) - Abnormal; Notable for the following components:   APPearance  CLOUDY (*)    Specific Gravity, Urine 1.035 (*)    Glucose, UA >=500 (*)    Hgb urine dipstick MODERATE (*)    Protein, ur 30 (*)    Leukocytes,Ua LARGE (*)    Bacteria, UA RARE (*)    Non Squamous Epithelial 0-5 (*)    All other components within normal limits  BRAIN NATRIURETIC PEPTIDE - Abnormal; Notable for the following components:   B Natriuretic Peptide 279.8 (*)    All other components within normal limits  BLOOD GAS, VENOUS - Abnormal; Notable for the following components:   pCO2, Ven 90 (*)    Bicarbonate 42.3 (*)    Acid-Base Excess 11.5 (*)    All other components within normal limits  GLUCOSE, CAPILLARY - Abnormal; Notable for the following components:   Glucose-Capillary 107 (*)    All other components within normal limits  MRSA NEXT GEN BY PCR, NASAL  CULTURE, BLOOD (ROUTINE X 2)  CULTURE, BLOOD (ROUTINE X 2)  SARS CORONAVIRUS 2 BY RT PCR  EXPECTORATED SPUTUM ASSESSMENT W GRAM STAIN, RFLX TO RESP C  URINE CULTURE  LACTIC ACID, PLASMA  LACTIC ACID, PLASMA  LEGIONELLA PNEUMOPHILA SEROGP 1 UR AG  MYCOPLASMA PNEUMONIAE ANTIBODY, IGM  STREP PNEUMONIAE URINARY ANTIGEN  HEMOGLOBIN A1C  CBC  BASIC METABOLIC PANEL  BLOOD GAS, VENOUS    EKG EKG Interpretation  Date/Time:  Tuesday Apr 28 2023 10:46:02 EDT Ventricular Rate:  88 PR Interval:  175 QRS Duration: 158 QT Interval:  391 QTC  Calculation: 474 R Axis:   -72 Text Interpretation: Sinus rhythm RBBB and LAFB Confirmed by Ernie Avena ([id number]) on 04/28/2023 11:49:03 AM  Radiology DG Chest Port 1 View  Result Date: 04/28/2023 CLINICAL DATA:  Evaluate for abnormality.  Possible sepsis. EXAM: PORTABLE CHEST 1 VIEW COMPARISON:  02/25/2022 FINDINGS: Cardiac enlargement. Aortic atherosclerotic calcifications. Bilateral upper lobe peripheral opacities are new when compared with the previous exam. There is also a new opacity identified within the right lung base. No signs of pleural effusion or edema. Previous right shoulder arthroplasty. IMPRESSION: New bilateral upper lobe peripheral opacities and right lung base opacity compatible with multifocal pneumonia. Followup PA and lateral chest X-ray is recommended in 3-4 weeks following trial of antibiotic therapy to ensure resolution and exclude underlying malignancy. Electronically Signed   By: Signa Kell M.D.   On: 04/28/2023 12:08    Procedures Procedures    Medications Ordered in ED Medications  doxycycline (VIBRAMYCIN) 100 mg in sodium chloride 0.9 % 250 mL IVPB (has no administration in time range)  ipratropium-albuterol (DUONEB) 0.5-2.5 (3) MG/3ML nebulizer solution 3 mL (3 mLs Nebulization Given 04/28/23 1408)  ondansetron (ZOFRAN) injection 4 mg (has no administration in time range)  acetaminophen (TYLENOL) tablet 1,000 mg (has no administration in time range)  aspirin chewable tablet 81 mg (has no administration in time range)  labetalol (NORMODYNE) injection 20 mg (20 mg Intravenous Given 04/28/23 1720)  levothyroxine (SYNTHROID) tablet 125 mcg (has no administration in time range)  polyethylene glycol (MIRALAX / GLYCOLAX) packet 17 g (has no administration in time range)  senna (SENOKOT) tablet 17.2 mg (has no administration in time range)  gabapentin (NEURONTIN) capsule 300 mg (has no administration in time range)  albuterol (PROVENTIL) (2.5 MG/3ML) 0.083% nebulizer  solution 2.5 mg (has no administration in time range)  fluticasone (FLONASE) 50 MCG/ACT nasal spray 2 spray (has no administration in time range)  umeclidinium-vilanterol (ANORO ELLIPTA) 62.5-25 MCG/ACT 1 puff (1 puff Inhalation Not Given  04/28/23 1342)  magnesium sulfate IVPB 1 g 100 mL (1 g Intravenous New Bag/Given 04/28/23 1707)  methylPREDNISolone sodium succinate (SOLU-MEDROL) 40 mg/mL injection 40 mg (has no administration in time range)  dorzolamide (TRUSOPT) 2 % ophthalmic solution 1 drop (has no administration in time range)  heparin injection 5,000 Units (5,000 Units Subcutaneous Given 04/28/23 1647)  escitalopram (LEXAPRO) tablet 10 mg (has no administration in time range)  furosemide (LASIX) injection 60 mg (60 mg Intravenous Given 04/28/23 1646)  guaiFENesin (MUCINEX) 12 hr tablet 1,200 mg (has no administration in time range)  pantoprazole (PROTONIX) EC tablet 40 mg (has no administration in time range)  insulin aspart (novoLOG) injection 0-15 Units ( Subcutaneous Not Given 04/28/23 1708)  piperacillin-tazobactam (ZOSYN) IVPB 3.375 g (has no administration in time range)  budesonide (PULMICORT) nebulizer solution 0.5 mg (has no administration in time range)  Chlorhexidine Gluconate Cloth 2 % PADS 6 each (6 each Topical Given 04/28/23 1708)  Oral care mouth rinse (has no administration in time range)  ceFEPIme (MAXIPIME) 2 g in sodium chloride 0.9 % 100 mL IVPB (0 g Intravenous Stopped 04/28/23 1319)  vancomycin (VANCOCIN) IVPB 1000 mg/200 mL premix (0 mg Intravenous Stopped 04/28/23 1319)  methylPREDNISolone sodium succinate (SOLU-MEDROL) 125 mg/2 mL injection 125 mg (125 mg Intravenous Given 04/28/23 1646)  acetaZOLAMIDE (DIAMOX) injection 500 mg (500 mg Intravenous Given 04/28/23 1707)  piperacillin-tazobactam (ZOSYN) IVPB 3.375 g (3.375 g Intravenous New Bag/Given 04/28/23 1648)  iohexol (OMNIPAQUE) 350 MG/ML injection 100 mL (100 mLs Intravenous Contrast Given 04/28/23 1448)    ED  Course/ Medical Decision Making/ A&P                             Medical Decision Making Amount and/or Complexity of Data Reviewed Labs: ordered. Radiology: ordered.  Risk Prescription drug management. Decision regarding hospitalization.    89 year old female with medical history significant for recurrent pneumonia recently treated with oral azithromycin who presents from [facility] with concern for respiratory failure.  The patient has had a nonproductive cough with rhonchi located in the lower lobes.  Was found to be hypoxic with EMS and was placed on 6 L and unable to improve saturations above 86%.  Subsequently placed on nonrebreather with improvement in saturations to 97%.  Worsening over the past few days.  No known fevers at home.  Remainder of history is limited by the patient's dementia.  On arrival, the patient was afebrile, temperature 99.9, not tachycardic or tachypneic, heart rate 88, RR 20, BP 144/65, hypoxic with EMS and subsequently placed on a nonrebreather, O2 saturations at 98% on arrival.  Sats were noted to be 86% on 7 L and nonrebreather was replaced.  Patient presentation concerning for aspiration pneumonia, community-acquired pneumonia, possible PE.  History is limited by the patient's dementia.  Considered CHF exacerbation.  Rhonchi noted on exam.  IV access was obtained and the patient was covered with broad-spectrum antibiotics to include vancomycin and cefepime as she has previously been on azithromycin and has been symptomatically worsening.  Laboratory evaluation significant for lactic acid normal at 0.9, CBC without leukocytosis or anemia, CMP with hyperglycemia 159, normal renal and liver function.  Chest x-ray was concerning for multifocal pneumonia which is the most likely explanation for the patient's acute hypoxic respiratory failure.  Septic workup was initiated however the patient is not meeting SIRS criteria at this time, code sepsis not called.   Blood cultures were collected x  2 prior to antibiotic administration.  CTA PE: IMPRESSION: 1. No definite pulmonary embolus is identified; reduced sensitivity for small (segmental and subsegmental) pulmonary emboli related to body habitus, motion artifact, artifact from right proximal humeral prosthesis, and arm positioning. 2. Multifocal airspace opacities in both lungs, suspicious for multilobar pneumonia. 3. Moderate cardiomegaly. 4. Coronary, aortic arch, and branch vessel atherosclerotic vascular disease. 5. Thoracic spondylosis. 6. Aortic atherosclerosis.  Aortic Atherosclerosis (ICD10-I70.0).   Patient not actively meeting SIRS criteria, was covered with broad-spectrum antibiotics, hospitalist medicine consulted for admission for treatment of pneumonia.  Stable on oxygen at time of admission.   Final Clinical Impression(s) / ED Diagnoses Final diagnoses:  Community acquired pneumonia, unspecified laterality  Acute respiratory failure with hypoxia [facility])    Rx / DC Orders ED Discharge Orders     None         Ernie Avena, MD 04/28/23 1758

## 2023-04-28 NOTE — Consult Note (Signed)
Consultation Note Date: 04/28/2023   Patient Name: Debbie Arnold  DOB: 16-Feb-1927  MRN: [id number]  Age / Sex: 89 y.o., female   PCP: Martyn Malay, MD Referring Physician: Ernie Avena, MD  Reason for Consultation: Establishing goals of care     Chief Complaint/History of Present Illness:   Patient is a 89 year old female with a past medical history of anxiety, CHF, COPD, diabetes mellitus type 2, GERD, history of COVID-19, history of recurrent UTIs, OSA, and PE who was admitted on 04/28/2023 for management of worsening shortness of breath.  Patient transferred from [facility] facility in was not guardian for evaluation.  As per EMR review, patient had recently received oral azithromycin for recurrent pneumonia management.  Patient has noted history of recurrent pneumonias since 11/2022.  Since admission, imaging showed multifocal pneumonia causing patient's acute hypoxic respiratory failure.  Patient receiving O2 support and antibiotics for management.  Palliative medicine consulted to assist with complex medical decision making.  Extensive review of EMR prior to presenting to bedside.  Also discussed care with bedside RN prior to meeting with patient and her family.  Presented to bedside and introduced myself and the role of the palliative medicine team to patient.  Patient very lethargic in bed on nonrebreather.  Patient's son presented to bedside at that time.  Again able to introduce myself and the role of the palliative medicine team in patient's care. Palliative medicine is specialized medical care for people living with serious illness. It focuses on providing relief from the symptoms and stress of a serious illness. The goal is to improve quality of life for both the patient and the family.  Son at bedside noted his brother was also present on the phone during our conversation.  There are 3 children total, 2 sons and 1 daughter as per son at bedside.  Son able to update me regarding  patient's medical history.  Describes recurrent pneumonias since December, 2023.  Son denied any known history of dementia or neurocognitive impairment though this is listed in patient's chart.  Son also denied any known history of aspiration.  Did express concern about possible silent aspiration with patient's recurrent multifocal pneumonias that continue to occur despite antibiotic management.  Noted would defer to hospitalist regarding SLP evaluation.  Son expressed hearing from other providers that this pneumonia is likely to happen again should patient survive this hospitalization.  Agreed and expressed concern with this with history son is describing.  Noted importance of continued conversations moving forward to determine how to best optimize patient's quality of life.  Son expressed concern about fully understanding DNR status.  Explained that DNR status only comes in to affect should patient's heart stop or she stop breathing, would not perform cardiac resuscitation or intubation to place patient on ventilator support.  Discussed patient can receive all appropriate medical care up until that point including the current oxygen support she is getting and antibiotics.  Son acknowledged this and agreed with DNR status, continue appropriate medical care.  Noted would continue to monitor patient's status and should patient further deteriorate, may need to discuss ways to optimize comfort so patient does not suffer.  Son agreed with this and opened continued conversations moving forward.  All questions answered at that time.  Provided emotional support via active listening.  Thanked patient and son for allowing me to meet with them today.  Bedside RN was present during conversation and noted patient will be transferred to floor room soon.  Primary Diagnoses  Present  on Admission: **None**  Palliative Review of Systems: Work of breathing appears increased  Past Medical History:  Diagnosis Date   COPD  (chronic obstructive pulmonary disease) (HCC)    Diabetes mellitus without complication (HCC)    Gout    HFrEF (heart failure with reduced ejection fraction) (HCC)    40-45%, mild MR, mod TR   Hypertension    Pulmonary embolism (HCC)    Stage III chronic kidney disease (HCC)    Thyroid disease    Social History   Socioeconomic History   Marital status: Married    Spouse name: Not on file   Number of children: Not on file   Years of education: Not on file   Highest education level: Not on file  Occupational History   Not on file  Tobacco Use   Smoking status: Never   Smokeless tobacco: Never  Substance and Sexual Activity   Alcohol use: No   Drug use: No   Sexual activity: Not on file  Other Topics Concern   Not on file  Social History Narrative   Not on file   Social Determinants of Health   Financial Resource Strain: Not on file  Food Insecurity: Not on file  Transportation Needs: Not on file  Physical Activity: Not on file  Stress: Not on file  Social Connections: Not on file   History reviewed. No pertinent family history. Scheduled Meds: Continuous Infusions:  vancomycin     PRN Meds:. Allergies  Allergen Reactions   Brimonidine Itching    Not listed on med list from [facility]   Celecoxib Other (See Comments)   Red Dye Other (See Comments)    Not listed on med list from [facility]   Codeine Anxiety   CBC:    Component Value Date/Time   WBC 5.6 04/28/2023 1056   HGB 12.0 04/28/2023 1056   HCT 41.7 04/28/2023 1056   PLT 182 04/28/2023 1056   MCV 104.5 (H) 04/28/2023 1056   NEUTROABS 4.1 04/28/2023 1056   LYMPHSABS 0.7 04/28/2023 1056   MONOABS 0.8 04/28/2023 1056   EOSABS 0.0 04/28/2023 1056   BASOSABS 0.0 04/28/2023 1056   Comprehensive Metabolic Panel:    Component Value Date/Time   NA 136 04/28/2023 1056   K 4.4 04/28/2023 1056   CL 92 (L) 04/28/2023 1056   CO2 36 (H) 04/28/2023 1056   BUN 25 (H) 04/28/2023 1056    CREATININE 0.97 04/28/2023 1056   GLUCOSE 159 (H) 04/28/2023 1056   CALCIUM 8.6 (L) 04/28/2023 1056   AST 16 04/28/2023 1056   ALT 13 04/28/2023 1056   ALKPHOS 28 (L) 04/28/2023 1056   BILITOT 0.3 04/28/2023 1056   PROT 7.7 04/28/2023 1056   ALBUMIN 3.6 04/28/2023 1056    Physical Exam: Vital Signs: BP (!) 144/65 (BP Location: Left Wrist)   Pulse 88   Temp 99.9 F (37.7 C) (Oral)   Resp 20   SpO2 97%  SpO2: SpO2: 97 % O2 Device: O2 Device: NRB O2 Flow Rate: O2 Flow Rate (L/min): 12 L/min Intake/output summary: No intake or output data in the 24 hours ending 04/28/23 1258 LBM:   Baseline Weight:   Most recent weight:    General: lethargic, laying in bed, chronically ill appearing  Eyes: no drainage noted HENT: Nonrebreather mask in place Cardiovascular: RRR Respiratory: increased work of breathing noted, nonrebreather mask in place, course breath sounds b/l, audible wheezing appreciated  Abdomen: not distended Skin: no rashes or lesions on visible  skin Neuro: Lethargic          Palliative Performance Scale: 40%              Additional Data Reviewed: Recent Labs    04/28/23 1056  WBC 5.6  HGB 12.0  PLT 182  NA 136  BUN 25*  CREATININE 0.97    Imaging: DG Chest Port 1 View CLINICAL DATA:  Evaluate for abnormality.  Possible sepsis.  EXAM: PORTABLE CHEST 1 VIEW  COMPARISON:  02/25/2022  FINDINGS: Cardiac enlargement. Aortic atherosclerotic calcifications. Bilateral upper lobe peripheral opacities are new when compared with the previous exam. There is also a new opacity identified within the right lung base. No signs of pleural effusion or edema. Previous right shoulder arthroplasty.  IMPRESSION: New bilateral upper lobe peripheral opacities and right lung base opacity compatible with multifocal pneumonia. Followup PA and lateral chest X-ray is recommended in 3-4 weeks following trial of antibiotic therapy to ensure resolution and exclude  underlying malignancy.  Electronically Signed   By: Signa Kell M.D.   On: 04/28/2023 12:08    I personally reviewed recent imaging.   Palliative Care Assessment and Plan Summary of Established Goals of Care and Medical Treatment Preferences    Patient is a 89 year old female with a past medical history of anxiety, CHF, COPD, diabetes mellitus type 2, GERD, history of COVID-19, history of recurrent UTIs, OSA, and PE who was admitted on 04/28/2023 for management of worsening shortness of breath.  Patient transferred from [facility] facility in was not guardian for evaluation.  As per EMR review, patient had recently received oral azithromycin for recurrent pneumonia management.  Patient has noted history of recurrent pneumonias since 11/2022.  Since admission, imaging showed multifocal pneumonia causing patient's acute hypoxic respiratory failure.  Patient receiving O2 support and antibiotics for management.  Palliative medicine consulted to assist with complex medical decision making.  # Complex medical decision making/goals of care  -Patient unable to participate in complex medical decision making due to underlying medical status at this time.   -Discussed care with patient's son as described above in HPI. Son noted there are three children total. Will need to determine if HCPOA documents are already in place; if not, majority of children (if no spouse) will need to make medical decisions if patient unable to do so.   -Discussed code status with patient's son. Confirmed that DNR only comes into place should patient's heart stop or she stop breathing as would then NOT initiate cardiac resuscitation or intubation. Son agreed with DNR status. Wants to continue appropriate medical therapies at this time including abx, O2, support, etc to determine if patient will be able to overcome this episode of PNA. Did express that should patient's medical statu worsen, may need to have further discussions as to  best support patient's comfort if appears she is reaching end of life.   -Son stated other physician expressed worry about PNA reoccurring if patient is able to survive this hospitalization. Agree with this worry with her multiple episodes of PNA. Son denied any known history of aspiration or dementia (though neurocognitive disorder noted in EMR). Discussed worry of silent aspiration and worry of muscles weakening with age though this may not be the root cause of patient's PNA. Defer to hospitalist as to SLP evaluation.   -  Code Status: DNR    -Code status confirmed with son at bedside. DNR paperwork in EMR from 2023.  # Symptom management  -As per primary hospitalist. Did discuss  with son at bedside monitoring patient's situation. If continues to worse, may need to consider addition of medications for comfort/work of breathing.   # Psycho-social/Spiritual Support:  - Support System: 2 sons and 1 daughter  # Discharge Planning:  To Be Determined  Thank you for allowing the palliative care team to participate in the care Juliette Alcide.  Alvester Morin, DO Palliative Care Provider PMT # [phone]  If patient remains symptomatic despite maximum doses, please call PMT at [phone] between 0700 and 1900. Outside of these hours, please call attending, as PMT does not have night coverage.  This provider spent a total of 80 minutes providing patient's care.  Includes review of EMR, discussing care with other staff members involved in patient's medical care, obtaining relevant history and information from patient and/or patient's family, and personal review of imaging and lab work. Greater than 50% of the time was spent counseling and coordinating care related to the above assessment and plan.    *Please note that this is a verbal dictation therefore any spelling or grammatical errors are due to the "[employer] One" system interpretation.

## 2023-04-28 NOTE — ED Notes (Signed)
Palliative Provider at bedside.

## 2023-04-28 NOTE — ED Notes (Signed)
ED TO INPATIENT HANDOFF REPORT  Name/Age/Gender Debbie Arnold 89 y.o. female  Code Status    Code Status Orders  (From admission, onward)           Start     Ordered   04/28/23 1316  Do not attempt resuscitation (DNR)  Continuous       Question Answer Comment  If patient has no pulse and is not breathing Do Not Attempt Resuscitation   If patient has a pulse and/or is breathing: Medical Treatment Goals LIMITED ADDITIONAL INTERVENTIONS: Use medication/IV fluids and cardiac monitoring as indicated; Do not use intubation or mechanical ventilation (DNI), also provide comfort medications.  Transfer to Progressive/Stepdown as indicated, avoid Intensive Care.   Consent: Discussion documented in EHR or advanced directives reviewed      04/28/23 1315           Code Status History     Date Active Date Inactive Code Status Order ID Comments User Context   12/30/2021 0022 01/08/2022 1757 Full Code [id number]  Hillary Bow, DO ED       Home/SNF/Other Skilled nursing facility  Chief Complaint PNA (pneumonia) [J18.9]  Level of Care/Admitting Diagnosis ED Disposition     ED Disposition  Admit   Condition  --   Comment  Hospital Area: [facility] [[id number]]  Level of Care: Stepdown [14]  Admit to SDU based on following criteria: Respiratory Distress:  Frequent assessment and/or intervention to maintain adequate ventilation/respiration, pulmonary toilet, and respiratory treatment.  May admit patient to [facility] or [facility] if equivalent level of care is available:: Yes  Covid Evaluation: Asymptomatic - no recent exposure (last 10 days) testing not required  Diagnosis: PNA (pneumonia) [409811]  Admitting Physician: Em[facility] General [[id number]]  Attending Physician: [facility] [[id number]]  Certification:: I certify this patient will need inpatient services for at least 2 midnights  Estimated Length of Stay: 4          Medical History Past Medical  History:  Diagnosis Date   COPD (chronic obstructive pulmonary disease) (HCC)    Diabetes mellitus without complication (HCC)    Gout    HFrEF (heart failure with reduced ejection fraction) (HCC)    40-45%, mild MR, mod TR   Hypertension    Pulmonary embolism (HCC)    Stage III chronic kidney disease (HCC)    Thyroid disease     Allergies Allergies  Allergen Reactions   Brimonidine Itching    Not listed on med list from [facility]   Celecoxib Other (See Comments)   Red Dye Other (See Comments)    Not listed on med list from [facility]   Codeine Anxiety    IV Location/Drains/Wounds Patient Lines/Drains/Airways Status     Active Line/Drains/Airways     Name Placement date Placement time Site Days   Peripheral IV 04/28/23 20 G Left Antecubital 04/28/23  1127  Antecubital  less than 1            Labs/Imaging Results for orders placed or performed during the hospital encounter of 04/28/23 (from the past 48 hour(s))  Lactic acid, plasma     Status: None   Collection Time: 04/28/23 10:56 AM  Result Value Ref Range   Lactic Acid, Venous 0.9 0.5 - 1.9 mmol/L    Comment: Performed at [facility], [address] [place], [place] [address]  Comprehensive metabolic panel     Status: Abnormal   Collection Time: 04/28/23  10:56 AM  Result Value Ref Range   Sodium 136 135 - 145 mmol/L   Potassium 4.4 3.5 - 5.1 mmol/L   Chloride 92 (L) 98 - 111 mmol/L   CO2 36 (H) 22 - 32 mmol/L   Glucose, Bld 159 (H) 70 - 99 mg/dL    Comment: Glucose reference range applies only to samples taken after fasting for at least 8 hours.   BUN 25 (H) 8 - 23 mg/dL   Creatinine, Ser 1.47 0.44 - 1.00 mg/dL   Calcium 8.6 (L) 8.9 - 10.3 mg/dL   Total Protein 7.7 6.5 - 8.1 g/dL   Albumin 3.6 3.5 - 5.0 g/dL   AST 16 15 - 41 U/L   ALT 13 0 - 44 U/L   Alkaline Phosphatase 28 (L) 38 - 126 U/L   Total Bilirubin 0.3 0.3 - 1.2 mg/dL   GFR, Estimated 54 (L) >60 mL/min     Comment: (NOTE) Calculated using the CKD-EPI Creatinine Equation (2021)    Anion gap 8 5 - 15    Comment: Performed at [facility], [address], [place], [place] [address]  CBC with Differential     Status: Abnormal   Collection Time: 04/28/23 10:56 AM  Result Value Ref Range   WBC 5.6 4.0 - 10.5 K/uL   RBC 3.99 3.87 - 5.11 MIL/uL   Hemoglobin 12.0 12.0 - 15.0 g/dL   HCT 21.3 08.6 - 57.8 %   MCV 104.5 (H) 80.0 - 100.0 fL   MCH 30.1 26.0 - 34.0 pg   MCHC 28.8 (L) 30.0 - 36.0 g/dL   RDW 46.9 62.9 - 52.8 %   Platelets 182 150 - 400 K/uL   nRBC 0.0 0.0 - 0.2 %   Neutrophils Relative % 73 %   Neutro Abs 4.1 1.7 - 7.7 K/uL   Lymphocytes Relative 12 %   Lymphs Abs 0.7 0.7 - 4.0 K/uL   Monocytes Relative 14 %   Monocytes Absolute 0.8 0.1 - 1.0 K/uL   Eosinophils Relative 0 %   Eosinophils Absolute 0.0 0.0 - 0.5 K/uL   Basophils Relative 0 %   Basophils Absolute 0.0 0.0 - 0.1 K/uL   Immature Granulocytes 1 %   Abs Immature Granulocytes 0.03 0.00 - 0.07 K/uL    Comment: Performed at [facility], [address], [place], [place] [address]  Brain natriuretic peptide     Status: Abnormal   Collection Time: 04/28/23 10:57 AM  Result Value Ref Range   B Natriuretic Peptide 279.8 (H) 0.0 - 100.0 pg/mL    Comment: Performed at [facility], [address] [place], [place] [address]   DG Chest Port 1 View  Result Date: 04/28/2023 CLINICAL DATA:  Evaluate for abnormality.  Possible sepsis. EXAM: PORTABLE CHEST 1 VIEW COMPARISON:  02/25/2022 FINDINGS: Cardiac enlargement. Aortic atherosclerotic calcifications. Bilateral upper lobe peripheral opacities are new when compared with the previous exam. There is also a new opacity identified within the right lung base. No signs of pleural effusion or edema. Previous right shoulder arthroplasty. IMPRESSION: New bilateral upper lobe peripheral opacities and right lung base opacity compatible  with multifocal pneumonia. Followup PA and lateral chest X-ray is recommended in 3-4 weeks following trial of antibiotic therapy to ensure resolution and exclude underlying malignancy. Electronically Signed   By: Signa Kell M.D.   On: 04/28/2023 12:08    Pending Labs Unresulted Labs (From admission, onward)     Start  Ordered   04/29/23 0500  CBC  Tomorrow morning,   R        04/28/23 1315   04/29/23 0500  Basic metabolic panel  Tomorrow morning,   R        04/28/23 1315   04/28/23 1346  Expectorated Sputum Assessment w Gram Stain, Rflx to Resp Cult  Once,   R        04/28/23 1345   04/28/23 1323  Hemoglobin A1c  Once,   R       Comments: To assess prior glycemic control    04/28/23 1323   04/28/23 1317  Blood gas, venous  Once,   R        04/28/23 1316   04/28/23 1310  Legionella Pneumophila Serogp 1 Ur Ag  Once,   URGENT        04/28/23 1310   04/28/23 1310  Mycoplasma pneumoniae antibody, IgM  Once,   URGENT        04/28/23 1310   04/28/23 1310  Strep pneumoniae urinary antigen  Once,   URGENT        04/28/23 1310   04/28/23 1112  MRSA Next Gen by PCR, Nasal  (MRSA Screening)  Once,   URGENT        04/28/23 1111   04/28/23 1058  SARS Coronavirus 2 by RT PCR (hospital order, performed in [facility] hospital lab) *cepheid single result test* Anterior Nasal Swab  (Tier 2 - SARS Coronavirus 2 by RT PCR (hospital order, performed in [facility] hospital lab) *cepheid single result test*)  Once,   URGENT        04/28/23 1058   04/28/23 1056  Lactic acid, plasma  (Undifferentiated presentation (screening labs and basic nursing orders))  STAT Now then every 2 hours,   R      04/28/23 1056   04/28/23 1056  Blood Culture (routine x 2)  (Undifferentiated presentation (screening labs and basic nursing orders))  BLOOD CULTURE X 2,   STAT      04/28/23 1056   04/28/23 1056  Urinalysis, w/ Reflex to Culture (Infection Suspected) -Urine, Clean Catch  (Undifferentiated presentation  (screening labs and basic nursing orders))  Once,   URGENT       Question:  Specimen Source  Answer:  Urine, Clean Catch   04/28/23 1056            Vitals/Pain Today's Vitals   04/28/23 1045 04/28/23 1051 04/28/23 1330  BP: (!) 144/65  131/66  Pulse: 88  62  Resp: 20  18  Temp: 99.9 F (37.7 C)    TempSrc: Oral    SpO2: 98% 97% 99%    Isolation Precautions No active isolations  Medications Medications  doxycycline (VIBRAMYCIN) 100 mg in sodium chloride 0.9 % 250 mL IVPB (has no administration in time range)  ipratropium-albuterol (DUONEB) 0.5-2.5 (3) MG/3ML nebulizer solution 3 mL (has no administration in time range)  ondansetron (ZOFRAN) injection 4 mg (has no administration in time range)  acetaminophen (TYLENOL) tablet 1,000 mg (has no administration in time range)  aspirin chewable tablet 81 mg (has no administration in time range)  labetalol (NORMODYNE) injection 20 mg (has no administration in time range)  levothyroxine (SYNTHROID) tablet 125 mcg (has no administration in time range)  polyethylene glycol powder (GLYCOLAX/MIRALAX) container 255 g (has no administration in time range)  psyllium (REGULOID) capsule 0.52 g (has no administration in time range)  senna (SENOKOT) tablet 17.2 mg (has no  administration in time range)  gabapentin (NEURONTIN) capsule 300 mg (has no administration in time range)  albuterol (VENTOLIN HFA) 108 (90 Base) MCG/ACT inhaler 2 puff (has no administration in time range)  fluticasone (FLONASE) 50 MCG/ACT nasal spray 2 spray (has no administration in time range)  umeclidinium-vilanterol (ANORO ELLIPTA) 62.5-25 MCG/ACT 1 puff (1 puff Inhalation Not Given 04/28/23 1342)  methylPREDNISolone sodium succinate (SOLU-MEDROL) 125 mg/2 mL injection 125 mg (has no administration in time range)  magnesium sulfate IVPB 1 g 100 mL (has no administration in time range)  methylPREDNISolone sodium succinate (SOLU-MEDROL) 40 mg/mL injection 40 mg (has no  administration in time range)  dorzolamide (TRUSOPT) 2 % ophthalmic solution 1 drop (has no administration in time range)  heparin injection 5,000 Units (has no administration in time range)  acetaZOLAMIDE (DIAMOX) injection 500 mg (has no administration in time range)  escitalopram (LEXAPRO) tablet 10 mg (has no administration in time range)  furosemide (LASIX) injection 60 mg (has no administration in time range)  guaiFENesin (MUCINEX) 12 hr tablet 1,200 mg (has no administration in time range)  pantoprazole (PROTONIX) EC tablet 40 mg (has no administration in time range)  insulin aspart (novoLOG) injection 0-15 Units (has no administration in time range)  piperacillin-tazobactam (ZOSYN) IVPB 3.375 g (has no administration in time range)  piperacillin-tazobactam (ZOSYN) IVPB 3.375 g (has no administration in time range)  budesonide (PULMICORT) nebulizer solution 0.5 mg (has no administration in time range)  ceFEPIme (MAXIPIME) 2 g in sodium chloride 0.9 % 100 mL IVPB (0 g Intravenous Stopped 04/28/23 1319)  vancomycin (VANCOCIN) IVPB 1000 mg/200 mL premix (0 mg Intravenous Stopped 04/28/23 1319)    Mobility non-ambulatory

## 2023-04-29 DIAGNOSIS — J9601 Acute respiratory failure with hypoxia: Secondary | ICD-10-CM | POA: Diagnosis not present

## 2023-04-29 DIAGNOSIS — J189 Pneumonia, unspecified organism: Secondary | ICD-10-CM

## 2023-04-29 DIAGNOSIS — Z515 Encounter for palliative care: Secondary | ICD-10-CM | POA: Diagnosis not present

## 2023-04-29 DIAGNOSIS — R4589 Other symptoms and signs involving emotional state: Secondary | ICD-10-CM | POA: Diagnosis not present

## 2023-04-29 LAB — BLOOD GAS, VENOUS
Acid-Base Excess: 11.5 mmol/L — ABNORMAL HIGH (ref 0.0–2.0)
Acid-Base Excess: 12 mmol/L — ABNORMAL HIGH (ref 0.0–2.0)
Bicarbonate: 40.1 mmol/L — ABNORMAL HIGH (ref 20.0–28.0)
Bicarbonate: 39.3 mmol/L — ABNORMAL HIGH (ref 20.0–28.0)
O2 Saturation: 86.6 %
O2 Saturation: 90.3 %
Patient temperature: 37
Patient temperature: 37
pCO2, Ven: 71 mmHg (ref 44–60)
pCO2, Ven: 62 mmHg — ABNORMAL HIGH (ref 44–60)
pH, Ven: 7.36 (ref 7.25–7.43)
pH, Ven: 7.41 (ref 7.25–7.43)
pO2, Ven: 53 mmHg — ABNORMAL HIGH (ref 32–45)
pO2, Ven: 57 mmHg — ABNORMAL HIGH (ref 32–45)

## 2023-04-29 LAB — BASIC METABOLIC PANEL
Anion gap: 15 (ref 5–15)
BUN: 32 mg/dL — ABNORMAL HIGH (ref 8–23)
CO2: 33 mmol/L — ABNORMAL HIGH (ref 22–32)
Calcium: 8.5 mg/dL — ABNORMAL LOW (ref 8.9–10.3)
Chloride: 90 mmol/L — ABNORMAL LOW (ref 98–111)
Creatinine, Ser: 1.44 mg/dL — ABNORMAL HIGH (ref 0.44–1.00)
GFR, Estimated: 34 mL/min — ABNORMAL LOW (ref >60)
Glucose, Bld: 217 mg/dL — ABNORMAL HIGH (ref 70–99)
Potassium: 4 mmol/L (ref 3.5–5.1)
Sodium: 138 mmol/L (ref 135–145)

## 2023-04-29 LAB — CBC
HCT: 37.8 % (ref 36.0–46.0)
Hemoglobin: 10.6 g/dL — ABNORMAL LOW (ref 12.0–15.0)
MCH: 30.4 pg (ref 26.0–34.0)
MCHC: 28 g/dL — ABNORMAL LOW (ref 30.0–36.0)
MCV: 108.3 fL — ABNORMAL HIGH (ref 80.0–100.0)
Platelets: 190 10*3/uL (ref 150–400)
RBC: 3.49 MIL/uL — ABNORMAL LOW (ref 3.87–5.11)
RDW: 13.9 % (ref 11.5–15.5)
WBC: 5.7 10*3/uL (ref 4.0–10.5)
nRBC: 0 % (ref 0.0–0.2)

## 2023-04-29 LAB — GLUCOSE, CAPILLARY
Glucose-Capillary: 197 mg/dL — ABNORMAL HIGH (ref 70–99)
Glucose-Capillary: 181 mg/dL — ABNORMAL HIGH (ref 70–99)
Glucose-Capillary: 166 mg/dL — ABNORMAL HIGH (ref 70–99)
Glucose-Capillary: 237 mg/dL — ABNORMAL HIGH (ref 70–99)
Glucose-Capillary: 196 mg/dL — ABNORMAL HIGH (ref 70–99)

## 2023-04-29 LAB — MYCOPLASMA PNEUMONIAE ANTIBODY, IGM: Mycoplasma pneumo IgM: <770 U/mL (ref 0–769)

## 2023-04-29 LAB — URINE CULTURE

## 2023-04-29 LAB — LEGIONELLA PNEUMOPHILA SEROGP 1 UR AG: L. pneumophila Serogp 1 Ur Ag: NEGATIVE

## 2023-04-29 LAB — CULTURE, BLOOD (ROUTINE X 2): Culture: NO GROWTH

## 2023-04-29 MED ORDER — ARFORMOTEROL TARTRATE 15 MCG/2ML IN NEBU
15.0000 ug | INHALATION_SOLUTION | Freq: Two times a day (BID) | RESPIRATORY_TRACT | Status: DC
  Administered 2023-04-29 – 2023-05-05 (×13): 15 ug via RESPIRATORY_TRACT
  Filled 2023-04-29 (×12): qty 2

## 2023-04-29 MED ORDER — ACETAMINOPHEN 500 MG PO TABS
1000.0000 mg | ORAL_TABLET | Freq: Two times a day (BID) | ORAL | Status: DC
  Administered 2023-04-29 – 2023-05-05 (×12): 1000 mg via ORAL
  Filled 2023-04-29 (×12): qty 2

## 2023-04-29 MED ORDER — INSULIN ASPART 100 UNIT/ML IJ SOLN
2.0000 [IU] | Freq: Once | INTRAMUSCULAR | Status: AC
  Administered 2023-04-29: 2 [IU] via SUBCUTANEOUS

## 2023-04-29 MED ORDER — ACETAMINOPHEN 500 MG PO TABS: 1000.0000 mg | ORAL_TABLET | Freq: Two times a day (BID) | ORAL | Status: DC

## 2023-04-29 NOTE — Progress Notes (Signed)
Clinical/Bedside Swallow Evaluation Patient Details  Name: Debbie Arnold MRN: [id number] Date of Birth: 06-21-1927  Today's Date: 04/29/2023 Time: SLP Start Time (ACUTE ONLY): 1030 SLP Stop Time (ACUTE ONLY): 1100 SLP Time Calculation (min) (ACUTE ONLY): 30 min  Past Medical History:  Past Medical History:  Diagnosis Date   COPD (chronic obstructive pulmonary disease) (HCC)    Diabetes mellitus without complication (HCC)    Gout    HFrEF (heart failure with reduced ejection fraction) (HCC)    40-45%, mild MR, mod TR   Hypertension    Pulmonary embolism (HCC)    Stage III chronic kidney disease (HCC)    Thyroid disease    Past Surgical History: History reviewed. No pertinent surgical history. HPI:  Per MD notes : "Debbie Arnold is a 89 y.o. female with medical history significant of COPD, recurrent multifocal pneumonia, restrictive lung disease, IDDM, HFpEF, HTN, remote history of PE with anticoagulation treatment, CKD stage IIIa, hypothyroidism, anxiety/depression, GERD, sent from nursing home for evaluation of worsening of hypoxia and mentation changes."  Pt has been seen by palliative and recurrent pneumonia since December 2023 noted.  PT required bipap support overnight - but was able to wean this am.  Swallow eval ordered.  Son, Ricki, present and reports pt has seen an ENT approx 15 years ago and scoped- and was informed she would need speech in the future for voice due to weakness.  She saw ENT for post nasal drip. Pt describes dysphagia as "choking" in the throat and having po "come back up".  Pt has prior h/o cervical laminectomy C3-C5 02/2021. Last brain scan done in 2001 due to weakness.dizziness showed mild cerebral atrophy, nothing acute.    Assessment / Plan / Recommendation  Clinical Impression  Patient fully alert with adequate phonation and participation during assessment.   She presents with clinical signs of minimal oral dysphagia c/b prolonged mastication and oral  retention. She requested liquids to clear but used applesauce effectively. She has dentures that appear slightly loose and reports cleaning them after meals. No indication of aspiration across all po trials including 3 ounce Yale, nectar thick, applesauce and solids.  Respiratory and swallow judged to be timed adequately.  Eructation noted but pt denies issues with GERD.  At this time, given pt's recurrent pneumonias, general deconditioning, recommend dys3/thin diet.   Encouraged pt to refrain from "washing down food" and use Ensures, slightly thicker drinks or purees to clear food particles from mouth.  Also rinse and expectorate after meals advised.  Sign posted and swallow precautions reviewed with pt and her son.  Will follow briefly to assure tolerance. SLP Visit Diagnosis: Dysphagia, oral phase (R13.11)    Aspiration Risk  Mild aspiration risk    Diet Recommendation Dysphagia 3 (Mech soft);Thin liquid   Liquid Administration via: Cup;Straw Medication Administration: Whole meds with puree Supervision: Staff to assist with self feeding Compensations: Slow rate;Small sips/bites;Other (Comment) (use puree or Ensures, thicker drinks to clear food into pharynx) Postural Changes: Seated upright at 90 degrees;Remain upright for at least 30 minutes after po intake    Other  Recommendations Oral Care Recommendations: Oral care BID    Recommendations for follow up therapy are one component of a multi-disciplinary discharge planning process, led by the attending physician.  Recommendations may be updated based on patient status, additional functional criteria and insurance authorization.  Follow up Recommendations No SLP follow up      Assistance Recommended at Discharge  N/a  Functional Status  Assessment Patient has had a recent decline in their functional status and demonstrates the ability to make significant improvements in function in a reasonable and predictable amount of time.  Frequency and  Duration min 1 x/week  1 week       Prognosis Prognosis for improved oropharyngeal function: Fair Barriers to Reach Goals: Cognitive deficits      Swallow Study   General Date of Onset: 04/29/23 HPI: Per MD notes : "Debbie Arnold is a 89 y.o. female with medical history significant of COPD, recurrent multifocal pneumonia, restrictive lung disease, IDDM, HFpEF, HTN, remote history of PE with anticoagulation treatment, CKD stage IIIa, hypothyroidism, anxiety/depression, GERD, sent from nursing home for evaluation of worsening of hypoxia and mentation changes."  Pt has been seen by palliative and recurrent pneumonia since December 2023 noted.  PT required bipap support overnight - but was able to wean this am.  Swallow eval ordered.  Son, Ricki, present and reports pt has seen an ENT approx 15 years ago and scoped- and was informed she would need speech in the future for voice due to weakness.  She saw ENT for post nasal drip. Pt describes dysphagia as "choking" in the throat and having po "come back up".  Pt has prior h/o cervical laminectomy C3-C5 02/2021. Last brain scan done in 2001 due to weakness.dizziness showed mild cerebral atrophy, nothing acute. Type of Study: Bedside Swallow Evaluation Diet Prior to this Study: NPO Temperature Spikes Noted: No Respiratory Status: Nasal cannula History of Recent Intubation: No Behavior/Cognition: Alert;Cooperative;Pleasant mood Oral Care Completed by SLP: No Oral Cavity - Dentition: Dentures, top;Dentures, bottom Vision: Functional for self-feeding Self-Feeding Abilities: Able to feed self Patient Positioning: Upright in bed Baseline Vocal Quality: Normal Volitional Cough: Weak Volitional Swallow: Able to elicit    Oral/Motor/Sensory Function Overall Oral Motor/Sensory Function: Within functional limits (mild weakness overall)   Ice Chips Ice chips: Not tested   Thin Liquid Thin Liquid: Within functional limits    Nectar Thick Nectar Thick  Liquid: Within functional limits   Honey Thick Honey Thick Liquid: Not tested   Puree Puree: Within functional limits   Solid     Solid: Impaired Presentation: Self Fed Oral Phase Impairments: Impaired mastication Oral Phase Functional Implications: Impaired mastication;Oral residue      Chales Abrahams 04/29/2023,2:16 PM   Rolena Infante, MS [facility] SLP Acute Rehab Services Office [phone]

## 2023-04-29 NOTE — TOC Initial Note (Addendum)
Transition of Care [facility]) - Initial/Assessment Note    Patient Details  Name: Debbie Arnold MRN: [id number] Date of Birth: [birth year]  [facility]) CM/SW Contact:    Lanier Clam, RN Phone Number: 04/29/2023, 10:01 AM  Clinical Narrative: Spoke to [name](Son) about d/c plans-from [facility], bedbound uses w/c. Rep [facility] to confirm.Palliative Care following await recc.                  Expected Discharge Plan:  (TBD) Barriers to Discharge: Continued Medical Work up   Patient Goals and CMS Choice Patient states their goals for this hospitalization and ongoing recovery are::  (TBD) CMS Medicare.gov Compare Post Acute Care list provided to:: Patient Represenative (must comment) ([name](Son)) Choice offered to / list presented to : Adult Children [facility] ownership interest in [facility].provided to:: Adult Children    Expected Discharge Plan and Services   Discharge Planning Services: CM Consult Post Acute Care Choice:  (TBD) Living arrangements for the past 2 months: Skilled Nursing Facility                                      Prior Living Arrangements/Services Living arrangements for the past 2 months: Skilled Nursing Facility Lives with:: Facility Resident Patient language and need for interpreter reviewed:: Yes Do you feel safe going back to the place where you live?: Yes      Need for Family Participation in Patient Care: Yes (Comment) Care giver support system in place?: Yes (comment) Current home services: DME (w/c bound) Criminal Activity/Legal Involvement Pertinent to Current Situation/Hospitalization: No - Comment as needed  Activities of Daily Living Home Assistive Devices/Equipment: Nurse, adult ADL Screening (condition at time of admission) Patient's cognitive ability adequate to safely complete daily activities?: No Is the patient deaf or have difficulty hearing?: Yes Does the patient have difficulty seeing, even when wearing  glasses/contacts?: Yes Does the patient have difficulty concentrating, remembering, or making decisions?: Yes Patient able to express need for assistance with ADLs?: No Does the patient have difficulty dressing or bathing?: Yes Independently performs ADLs?: No Communication: Independent Dressing (OT): Dependent Is this a change from baseline?: Pre-admission baseline Grooming: Dependent Is this a change from baseline?: Pre-admission baseline Feeding: Needs assistance Is this a change from baseline?: Pre-admission baseline Bathing: Needs assistance Is this a change from baseline?: Pre-admission baseline Toileting: Dependent Is this a change from baseline?: Pre-admission baseline In/Out Bed: Dependent Is this a change from baseline?: Pre-admission baseline Walks in Home: Dependent Is this a change from baseline?: Pre-admission baseline Does the patient have difficulty walking or climbing stairs?: Yes Weakness of Legs: Both Weakness of Arms/Hands: None  Permission Sought/Granted Permission sought to share information with : Case Manager Permission granted to share information with : Yes, Verbal Permission Granted  Share Information with NAME: Case manager           Emotional Assessment Appearance:: Appears stated age Attitude/Demeanor/Rapport: Gracious Affect (typically observed): Accepting Orientation: : Oriented to Self, Oriented to Place, Oriented to  Time, Oriented to Situation Alcohol / Substance Use: Not Applicable Psych Involvement: No (comment)  Admission diagnosis:  Acute respiratory failure with hypoxia (HCC) [J96.01] PNA (pneumonia) [J18.9] Community acquired pneumonia, unspecified laterality [J18.9] Patient Active Problem List   Diagnosis Date Noted   Multifocal pneumonia 04/28/2023   Acute encephalopathy 04/28/2023   PNA (pneumonia) 04/28/2023   Palliative care encounter 04/28/2023   Acute  respiratory failure with hypoxia (HCC) 04/28/2023   Goals of care,  counseling/discussion 04/28/2023   DNR (do not resuscitate) 04/28/2023   Need for emotional support 04/28/2023   Acute pulmonary edema (HCC)    Pulmonary embolism (HCC) 01/26/2022   Gout 01/26/2022   Stage III chronic kidney disease (HCC) 01/26/2022   Acute lower UTI 01/01/2022   Class 3 obesity (HCC) 01/01/2022   Depression 01/01/2022   Acute kidney injury superimposed on chronic kidney disease (HCC) 12/30/2021   Acute on chronic diastolic CHF (congestive heart failure) (HCC) 12/30/2021   CHF (congestive heart failure) (HCC) 12/30/2021   Acute kidney injury (HCC) 11/24/2017   Hyponatremia 11/24/2017   Diabetes mellitus type 2 in obese 11/23/2017   Hyperlipidemia with target LDL less than 100 11/23/2017   Lumbar facet arthropathy 05/28/2017   Steroid responder, bilateral 01/12/2017   COPD (chronic obstructive pulmonary disease) (HCC) 08/05/2016   Gastroesophageal reflux disease without esophagitis 08/02/2015   Laryngopharyngeal reflux 08/02/2015   Corneal edema 07/03/2015   XT (exotropia) 07/03/2015   Anterior uveitis 06/22/2015   Pain of right eye 05/21/2015   Allergic conjunctivitis of both eyes 03/14/2014   Anxiety, generalized 01/04/2014   Pseudophakia of both eyes 04/27/2013   Obesity, Class III, BMI 40-49.9 (morbid obesity) (HCC) 04/13/2013   Peripheral neuropathy 12/14/2012   Diabetes (HCC) 11/10/2012   Primary open angle glaucoma of both eyes, mild stage 11/10/2012   Spinal stenosis of lumbar region with neurogenic claudication 09/08/2012   Low back pain 07/19/2012   Diabetes mellitus with stage 3 chronic kidney disease (HCC) 11/12/2010   HTN (hypertension) 11/29/2009   Benign neoplasm of skin 05/02/2009   Hypothyroidism 05/02/2009   Constipation 04/16/2009   Other affections of shoulder region, not elsewhere classified 05/30/2008   Obstructive sleep apnea syndrome 04/07/2008   Anemia, chronic disease 03/22/2008   Alopecia 02/02/2008   DDD (degenerative disc  disease), lumbar 01/18/2008   Osteoarthritis of multiple joints 01/18/2008   PCP:  Martyn Malay, MD Pharmacy:   [id number] - [place], [place] - [address] AT [facility] [address] [place] [address] Phone: [phone] Fax: [phone]  [facility] [address] [place] [address] Phone: [phone] Fax: [phone]     Social Determinants of Health (SDOH) Social History: SDOH Screenings   Food Insecurity: No Food Insecurity (04/28/2023)  Housing: Low Risk  (04/28/2023)  Transportation Needs: No Transportation Needs (04/28/2023)  Utilities: Not At Risk (04/28/2023)  Tobacco Use: Low Risk  (04/28/2023)   SDOH Interventions:     Readmission Risk Interventions     No data to display

## 2023-04-29 NOTE — Progress Notes (Signed)
Chaplain engaged in an initial visit with Tykisha and her son. Son shared that his sister passed recently. He attributed some of Tulip's confusion and anxiety today to that grief and loss. He shared that he has never seen [place] like this. Chaplain worked to provide calm and supportive presence to [place]. Krystina responded to me rubbing her leg and saying "yes mam" to her as she shared her grievances.   Chaplain will follow-up.    04/29/23 1400  Spiritual Encounters  Type of Visit Initial  Care provided to: Pt and family  Reason for visit Routine spiritual support  Spiritual Framework  Presenting Themes Community and relationships;Significant life change  Interventions  Spiritual Care Interventions Made Compassionate presence;Reflective listening;Established relationship of care and support  Intervention Outcomes  Outcomes Awareness of support;Connection to spiritual care

## 2023-04-29 NOTE — Progress Notes (Signed)
Daily Progress Note   Patient Name: Debbie Arnold       Date: 04/29/2023 DOB: [birth year]  Age: 89 y.o. MRN#: [id number] Attending Physician: Briant Cedar, MD Primary Care Physician: Martyn Malay, MD Admit Date: 04/28/2023 Length of Stay: 1 day  Reason for Consultation/Follow-up: Establishing goals of care  Subjective:   CC: Patient more awake today and asking when she will be able to eat and drink again. Following up regarding complex medical decision making.  Subjective:  Reviewed EMR prior to presenting to bedside.  Patient was on BiPAP overnight.  Patient was transition to high flow nasal cannula and then nasal cannula this morning.  Presented to bedside to meet with patient.  Patient's son Debbie Arnold also present at bedside when presenting.  Introduced myself to the patient who is laying comfortably in the bed.  Patient is much more awake and interactive today.  Patient welcoming visit.  Patient inquiring when she will be able to eat and drink again because her mouth is so dry.  Patient looking forward to getting out of the hospital soon.  Discussed with patient and Debbie Arnold about SLP evaluation with worry about patient's aspiration causing recurrent pneumonia.  Awaiting SLP input.  Patient's only symptom concern at this time dry mouth was her chronic left shoulder pain.  Patient normally takes Tylenol at home for this for management.  Discussed with patient and she is willing to take 1000 mg Tylenol scheduled twice daily.  If needed will increase to 3 times daily.  Patient able to recognize Debbie Arnold at bedside.  Patient notes she has 4 children total.  Patient notes that if she was unable to make medical decisions for herself, she would want her son Debbie Arnold and her daughter [name] to make medical decisions for her.  Debbie Arnold believes he may have HCPOA paperwork at home so inquired if he could please bring that in to scan into EMR. All questions answered at that time.  Thanked patient and her son  for allowing me to visit with them today.  Review of Systems Chronic left shoulder pain  Objective:   Vital Signs:  BP (!) 151/58 (BP Location: Left Arm)   Pulse 71   Temp 99.3 F (37.4 C) (Axillary)   Resp 12   Ht 5\' 5"  (1.651 m)   Wt 126.3 kg   SpO2 94%   BMI 46.34 kg/m   Physical Exam: General: NAD, alert, laying in bed, pleasant, heard of hearing Eyes: no drainage noted HENT: dry mucous membranes Cardiovascular: RRR Respiratory: no increased work of breathing noted, not in respiratory distress, on [place] O2 Neuro: awake, interactive Psych: pleasant  Imaging:  I personally reviewed recent imaging.   Assessment & Plan:   Assessment: Patient is a 89 year old female with a past medical history of anxiety, CHF, COPD, diabetes mellitus type 2, GERD, history of COVID-19, history of recurrent UTIs, OSA, and PE who was admitted on 04/28/2023 for management of worsening shortness of breath.  Patient transferred from [facility] facility in was not guardian for evaluation.  As per EMR review, patient had recently received oral azithromycin for recurrent pneumonia management.  Patient has noted history of recurrent pneumonias since 11/2022.  Since admission, imaging showed multifocal pneumonia causing patient's acute hypoxic respiratory failure.  Patient receiving O2 support and antibiotics for management.  Palliative medicine consulted to assist with complex medical decision making.   Recommendations/Plan: # Complex medical decision making/goals of care:   -Patient much more alert and interactive today.  Patient  hoping to be able to eat and drink soon and then get out of the hospital.  Patient's son, Debbie Arnold, at bedside during conversation.  Continuing current medical care at this time. Awaiting SLP evaluation to assist in further discussions moving forward.  -Patient can state that she has 4 children.  Patient notes that if she is unable to make medical decisions for herself, she would want her  son-Debbie Arnold and her daughter-Debbie Arnold called to assist in making medical decisions for her.  Debbie Arnold believes that he has healthcare power of attorney documentation in addition to POA documentation at home.  Debbie Arnold will review and bring in if able.  Also noted chaplain can assist with HCPOA documentation while in hospital if needed.                -  Code Status: DNR    # Symptom management                -Left shoulder pain, chronic   -Change to Tylenol 1000 mg BID which patient agreed with taking. Can go up to TID if needed.   # Psycho-social/Spiritual Support:  - Patient 4 children today. Debbie Arnold and Debbie Arnold help her.   # Discharge Planning: TBD  Discussed with: bedside RN, SLP, patient, patient's son Debbie Arnold  Thank you for allowing the palliative care team to participate in the care Juliette Alcide.  Alvester Morin, DO Palliative Care Provider PMT # [phone]  If patient remains symptomatic despite maximum doses, please call PMT at [phone] between 0700 and 1900. Outside of these hours, please call attending, as PMT does not have night coverage.  This provider spent a total of 36 minutes providing patient's care.  Includes review of EMR, discussing care with other staff members involved in patient's medical care, obtaining relevant history and information from patient and/or patient's family, and personal review of imaging and lab work. Greater than 50% of the time was spent counseling and coordinating care related to the above assessment and plan.    *Please note that this is a verbal dictation therefore any spelling or grammatical errors are due to the "Dragon [employer] One" system interpretation.

## 2023-04-29 NOTE — Progress Notes (Signed)
BSE completed, full report to follow.     Recommend dys3/thin - medicine with applesauce - start and follow with liquids.    Rolena Infante, MS [facility] SLP Acute [employer] [phone]

## 2023-04-29 NOTE — Progress Notes (Signed)
PROGRESS NOTE  [name]:[id number] DOB: [birth year] DOA: 04/28/2023 PCP: Martyn Malay, MD  HPI/Recap of past 24 hours: Debbie Arnold is a 89 y.o. female with medical history significant of COPD, recurrent multifocal pneumonia, restrictive lung disease, IDDM, HFpEF, HTN, remote history of PE on anticoagulation, CKD stage IIIa, hypothyroidism, GERD, sent from nursing home for evaluation of worsening of hypoxia and mentation changes. History provided by son at bedside.  Son reported that the patient has been having breathing problems for about 1 month with productive cough yellowish sputum, wheezing and was treated for pneumonia last week with azithromycin with no significant improvement.  In the ED, patient was noted to be requiring about 15 L of O2 was subsequently placed on BiPAP due to hypoxic and hypercapnic respiratory failure.  Chest x-ray showed multifocal bilateral pneumonia.  Patient was admitted for further management.    Today, patient was noted to be more awake, was able to carry on a simple conversation, requiring about 6 L of O2 via nasal cannula.  Shortly after, patient was noted to be somewhat confused and placed back on BiPAP.  Discussed extensively with son at bedside.   Assessment/Plan: Principal Problem:   PNA (pneumonia) Active Problems:   COPD (chronic obstructive pulmonary disease) (HCC)   Acute on chronic diastolic CHF (congestive heart failure) (HCC)   CHF (congestive heart failure) (HCC)   Multifocal pneumonia   Acute encephalopathy   Palliative care encounter   Acute respiratory failure with hypoxia (HCC)   Goals of care, counseling/discussion   DNR (do not resuscitate)   Need for emotional support   Acute hypoxic and acute on chronic hypercapnic respiratory failure Likely multifactorial including multifocal/aspiration pneumonia, acute COPD exacerbation and possibly acute on chronic HFpEF decompensation Currently afebrile, with no leukocytosis CTA  chest negative for PE but with reduced sensitivity due to body habitus, motion artifact, multifocal airspace opacities in both lungs suspicious for multilobar pneumonia Urine strep pneumo, Legionella and both negative Continue Zosyn, doxycycline SLP consulted, appreciate recs with modified diet Aspiration precautions Supplemental O2, BiPAP as needed  Acute metabolic encephalopathy Improving Likely due to above Management as above   Possible acute COPD exacerbation Continue DuoNebs, Brovana, Pulmicort, as needed albuterol, Continue IV Solu-Medrol  Incentive spirometry Supplemental oxygen, BiPAP as needed   Possible acute on chronic HFpEF decompensation BNP 279, falsely low in obese patients Last echo in 2023 showed EF of 55 to 60%, no regional wall motion abnormalities, grade 1 diastolic dysfunction Started on IV Lasix, will hold dose for today due to a significant jump in creatinine Daily weights, strict I's and O's  CKD stage IIIa Noted jump in creatinine Hold Lasix for today Daily BMP   HTN Hold all p.o. medication, start as needed labetalol   IDDM SSI for now, hold off PO DM meds  Hypothyroidism Start Synthroid once able  Morbid obesity Estimated body mass index is 46.34 kg/m as calculated from the following:   Height as of this encounter: 5\' 5"  (1.651 m).   Weight as of this encounter: 126.3 kg.     Code Status: DNR  Family Communication: Discussed extensively with son at bedside  Disposition Plan: Status is: Inpatient Remains inpatient appropriate because: Level of care      Consultants: None  Procedures: None  Antimicrobials: Zosyn Doxycycline  DVT prophylaxis: Heparin Diablo   Objective: Vitals:   04/29/23 1300 04/29/23 1325 04/29/23 1500 04/29/23 1600  BP: (!) 168/61     Pulse: 82 85 81  Resp: (!) 25 (!) 32 18   Temp:    98.2 F (36.8 C)  TempSrc:    Oral  SpO2: 96% 100% 99%   Weight:      Height:        Intake/Output Summary  (Last 24 hours) at 04/29/2023 1840 Last data filed at 04/29/2023 1800 Gross per 24 hour  Intake 1079.95 ml  Output 1500 ml  Net -420.05 ml   Filed Weights   04/28/23 1600  Weight: 126.3 kg    Exam: General: NAD, more awake/alert Cardiovascular: S1, S2 present Respiratory: Minich breath sounds bilaterally, bibasilar crackles Abdomen: Soft, nontender, nondistended, bowel sounds present Musculoskeletal: Trace bilateral pedal edema noted Skin: Normal Psychiatry: Normal mood     Data Reviewed: CBC: Recent Labs  Lab 04/28/23 1056 04/29/23 0331  WBC 5.6 5.7  NEUTROABS 4.1  --   HGB 12.0 10.6*  HCT 41.7 37.8  MCV 104.5* 108.3*  PLT 182 190   Basic Metabolic Panel: Recent Labs  Lab 04/28/23 1056 04/29/23 0331  NA 136 138  K 4.4 4.0  CL 92* 90*  CO2 36* 33*  GLUCOSE 159* 217*  BUN 25* 32*  CREATININE 0.97 1.44*  CALCIUM 8.6* 8.5*   GFR: Estimated Creatinine Clearance: 32.6 mL/min (A) (by C-G formula based on SCr of 1.44 mg/dL (H)). Liver Function Tests: Recent Labs  Lab 04/28/23 1056  AST 16  ALT 13  ALKPHOS 28*  BILITOT 0.3  PROT 7.7  ALBUMIN 3.6   No results for input(s): "LIPASE", "AMYLASE" in the last 168 hours. No results for input(s): "AMMONIA" in the last 168 hours. Coagulation Profile: No results for input(s): "INR", "PROTIME" in the last 168 hours. Cardiac Enzymes: No results for input(s): "CKTOTAL", "CKMB", "CKMBINDEX", "TROPONINI" in the last 168 hours. BNP (last 3 results) No results for input(s): "PROBNP" in the last 8760 hours. HbA1C: Recent Labs    04/28/23 1635  HGBA1C 7.3*   CBG: Recent Labs  Lab 04/28/23 1700 04/28/23 2119 04/29/23 0753 04/29/23 1157 04/29/23 1614  GLUCAP 107* 202* 197* 181* 166*   Lipid Profile: No results for input(s): "CHOL", "HDL", "LDLCALC", "TRIG", "CHOLHDL", "LDLDIRECT" in the last 72 hours. Thyroid Function Tests: No results for input(s): "TSH", "T4TOTAL", "FREET4", "T3FREE", "THYROIDAB" in the  last 72 hours. Anemia Panel: No results for input(s): "VITAMINB12", "FOLATE", "FERRITIN", "TIBC", "IRON", "RETICCTPCT" in the last 72 hours. Urine analysis:    Component Value Date/Time   COLORURINE YELLOW 04/28/2023 1612   APPEARANCEUR CLOUDY (A) 04/28/2023 1612   LABSPEC 1.035 (H) 04/28/2023 1612   PHURINE 5.0 04/28/2023 1612   GLUCOSEU >=500 (A) 04/28/2023 1612   HGBUR MODERATE (A) 04/28/2023 1612   BILIRUBINUR NEGATIVE 04/28/2023 1612   KETONESUR NEGATIVE 04/28/2023 1612   PROTEINUR 30 (A) 04/28/2023 1612   NITRITE NEGATIVE 04/28/2023 1612   LEUKOCYTESUR LARGE (A) 04/28/2023 1612   Sepsis Labs: @LABRCNTIP (procalcitonin:4,lacticidven:4)  ) Recent Results (from the past 240 hour(s))  Blood Culture (routine x 2)     Status: None (Preliminary result)   Collection Time: 04/28/23 11:25 AM   Specimen: BLOOD  Result Value Ref Range Status   Specimen Description   Final    BLOOD BLOOD LEFT FOREARM Performed at [facility], [address] [place], [place] [address]    Special Requests   Final    Blood Culture results may not be optimal due to an inadequate volume of blood received in culture bottles BOTTLES DRAWN AEROBIC ONLY Performed at [facility],  [address] [place], [place] [address]    Culture   Final    NO GROWTH < 24 HOURS Performed at [facility], [address] [place], [place] [address]    Report Status PENDING  Incomplete  Blood Culture (routine x 2)     Status: None (Preliminary result)   Collection Time: 04/28/23 11:30 AM   Specimen: BLOOD  Result Value Ref Range Status   Specimen Description   Final    BLOOD RIGHT ANTECUBITAL Performed at [facility], [address] [place], [place] [address]    Special Requests   Final    BOTTLES DRAWN AEROBIC AND ANAEROBIC Blood Culture results may not be optimal due to an inadequate volume of blood received in culture bottles Performed at [facility], [address] [place], [place] [address]    Culture   Final    NO GROWTH < 24 HOURS Performed at [facility], [address] [place], [place] [address]    Report Status PENDING  Incomplete  MRSA Next Gen by PCR, Nasal     Status: None   Collection Time: 04/28/23  3:53 PM   Specimen: Nasal Mucosa; Nasal Swab  Result Value Ref Range Status   MRSA by PCR Next Gen NOT DETECTED NOT DETECTED Final    Comment: (NOTE) The GeneXpert MRSA Assay (FDA approved for NASAL specimens only), is one component of a comprehensive MRSA colonization surveillance program. It is not intended to diagnose MRSA infection nor to guide or monitor treatment for MRSA infections. Test performance is not FDA approved in patients less than 84 years old. Performed at [facility], [address] [place], [place] [address]   SARS Coronavirus 2 by RT PCR (hospital order, performed in [facility] hospital lab) *cepheid single result test* Urine, Unspecified Source     Status: None   Collection Time: 04/28/23  4:12 PM   Specimen: Urine, Unspecified Source; Nasal Swab  Result Value Ref Range Status   SARS Coronavirus 2 by RT PCR NEGATIVE NEGATIVE Final    Comment: (NOTE) SARS-CoV-2 target nucleic acids are NOT DETECTED.  The SARS-CoV-2 RNA is generally detectable in upper and lower respiratory specimens during the acute phase of infection. The lowest concentration of SARS-CoV-2 viral copies this assay can detect is 250 copies / mL. A negative result does not preclude SARS-CoV-2 infection and should not be used as the sole basis for treatment or other patient management decisions.  A negative result may occur with improper specimen collection / handling, submission of specimen other than nasopharyngeal swab, presence of viral mutation(s) within the areas targeted by this assay, and inadequate number of viral copies (<250 copies / mL). A negative result must be  combined with clinical observations, patient history, and epidemiological information.  Fact Sheet for Patients:   RoadLapTop.co.za  Fact Sheet for Healthcare Providers: http://kim-miller.com/  This test is not yet approved or  cleared by the [place] FDA and has been authorized for detection and/or diagnosis of SARS-CoV-2 by FDA under an Emergency Use Authorization (EUA).  This EUA will remain in effect (meaning this test can be used) for the duration of the COVID-19 declaration under Section 564(b)(1) of the Act, 21 U.S.C. section 360bbb-3(b)(1), unless the authorization is terminated or revoked sooner.  Performed at [facility], [address], [place], [place] [address]   Urine Culture     Status: Abnormal (Preliminary result)  Collection Time: 04/28/23  4:12 PM   Specimen: Urine, Random  Result Value Ref Range Status   Specimen Description   Final    URINE, RANDOM Performed at [facility], [address], [place], [place] [address]    Special Requests   Final    NONE Reflexed from [id number] Performed at [facility], [address], [place], [place] [address]    Culture (A)  Final    50,000 COLONIES/mL GRAM NEGATIVE RODS IDENTIFICATION AND SUSCEPTIBILITIES TO FOLLOW Performed at [facility], [address] [place], [place] [address]    Report Status PENDING  Incomplete      Studies: No results found.  Scheduled Meds:  acetaminophen  1,000 mg Oral BID   arformoterol  15 mcg Nebulization BID   aspirin  81 mg Oral Daily   budesonide  0.5 mg Nebulization BID   Chlorhexidine Gluconate Cloth  6 each Topical Daily   dorzolamide  1 drop Both Eyes BID   escitalopram  10 mg Oral Daily   fluticasone  2 spray Each Nare Daily   gabapentin  300 mg Oral BID   guaiFENesin  1,200 mg Oral BID   heparin  5,000 Units Subcutaneous Q12H   insulin aspart  0-15 Units  Subcutaneous TID WC   ipratropium-albuterol  3 mL Nebulization Q6H   levothyroxine  125 mcg Oral QAC breakfast   methylPREDNISolone (SOLU-MEDROL) injection  40 mg Intravenous Q8H   pantoprazole  40 mg Oral Daily   polyethylene glycol  17 g Oral BID   senna  2 tablet Oral BID    Continuous Infusions:  doxycycline (VIBRAMYCIN) IV Stopped (04/29/23 1220)   piperacillin-tazobactam (ZOSYN)  IV Stopped (04/29/23 1754)     LOS: 1 day     Briant Cedar, MD [facility]  If 7PM-7AM, please contact night-coverage [email or web address] 04/29/2023, 6:40 PM

## 2023-04-30 ENCOUNTER — Inpatient Hospital Stay (HOSPITAL_COMMUNITY): Payer: Medicare HMO

## 2023-04-30 DIAGNOSIS — I5033 Acute on chronic diastolic (congestive) heart failure: Secondary | ICD-10-CM | POA: Diagnosis not present

## 2023-04-30 DIAGNOSIS — Z515 Encounter for palliative care: Secondary | ICD-10-CM | POA: Diagnosis not present

## 2023-04-30 DIAGNOSIS — Z7189 Other specified counseling: Secondary | ICD-10-CM

## 2023-04-30 DIAGNOSIS — J9601 Acute respiratory failure with hypoxia: Secondary | ICD-10-CM | POA: Diagnosis not present

## 2023-04-30 DIAGNOSIS — J189 Pneumonia, unspecified organism: Secondary | ICD-10-CM | POA: Diagnosis not present

## 2023-04-30 LAB — URINE CULTURE: Culture: 50000 — AB

## 2023-04-30 LAB — CBC WITH DIFFERENTIAL/PLATELET
Abs Immature Granulocytes: 0.02 10*3/uL (ref 0.00–0.07)
Basophils Absolute: 0 10*3/uL (ref 0.0–0.1)
Basophils Relative: 0 %
Eosinophils Absolute: 0 10*3/uL (ref 0.0–0.5)
Eosinophils Relative: 0 %
HCT: 35.2 % — ABNORMAL LOW (ref 36.0–46.0)
Hemoglobin: 10.2 g/dL — ABNORMAL LOW (ref 12.0–15.0)
Immature Granulocytes: 0 %
Lymphocytes Relative: 23 %
Lymphs Abs: 1 10*3/uL (ref 0.7–4.0)
MCH: 30.3 pg (ref 26.0–34.0)
MCHC: 29 g/dL — ABNORMAL LOW (ref 30.0–36.0)
MCV: 104.5 fL — ABNORMAL HIGH (ref 80.0–100.0)
Monocytes Absolute: 0.4 10*3/uL (ref 0.1–1.0)
Monocytes Relative: 8 %
Neutro Abs: 3.2 10*3/uL (ref 1.7–7.7)
Neutrophils Relative %: 69 %
Platelets: 199 10*3/uL (ref 150–400)
RBC: 3.37 MIL/uL — ABNORMAL LOW (ref 3.87–5.11)
RDW: 14.1 % (ref 11.5–15.5)
WBC: 4.6 10*3/uL (ref 4.0–10.5)
nRBC: 0 % (ref 0.0–0.2)

## 2023-04-30 LAB — BASIC METABOLIC PANEL
Anion gap: 12 (ref 5–15)
BUN: 46 mg/dL — ABNORMAL HIGH (ref 8–23)
CO2: 33 mmol/L — ABNORMAL HIGH (ref 22–32)
Calcium: 8.2 mg/dL — ABNORMAL LOW (ref 8.9–10.3)
Chloride: 92 mmol/L — ABNORMAL LOW (ref 98–111)
Creatinine, Ser: 1.54 mg/dL — ABNORMAL HIGH (ref 0.44–1.00)
GFR, Estimated: 31 mL/min — ABNORMAL LOW (ref >60)
Glucose, Bld: 217 mg/dL — ABNORMAL HIGH (ref 70–99)
Potassium: 3.7 mmol/L (ref 3.5–5.1)
Sodium: 137 mmol/L (ref 135–145)

## 2023-04-30 LAB — GLUCOSE, CAPILLARY
Glucose-Capillary: 192 mg/dL — ABNORMAL HIGH (ref 70–99)
Glucose-Capillary: 225 mg/dL — ABNORMAL HIGH (ref 70–99)
Glucose-Capillary: 235 mg/dL — ABNORMAL HIGH (ref 70–99)
Glucose-Capillary: 245 mg/dL — ABNORMAL HIGH (ref 70–99)

## 2023-04-30 LAB — CULTURE, BLOOD (ROUTINE X 2)

## 2023-04-30 MED ORDER — HYDRALAZINE HCL 20 MG/ML IJ SOLN
10.0000 mg | Freq: Four times a day (QID) | INTRAMUSCULAR | Status: AC | PRN
  Administered 2023-04-30 (×2): 10 mg via INTRAVENOUS
  Filled 2023-04-30 (×2): qty 1

## 2023-04-30 MED ORDER — IPRATROPIUM-ALBUTEROL 0.5-2.5 (3) MG/3ML IN SOLN
3.0000 mL | Freq: Three times a day (TID) | RESPIRATORY_TRACT | Status: DC
  Administered 2023-04-30 – 2023-05-01 (×4): 3 mL via RESPIRATORY_TRACT
  Filled 2023-04-30 (×4): qty 3

## 2023-04-30 MED ORDER — METOPROLOL SUCCINATE ER 50 MG PO TB24
50.0000 mg | ORAL_TABLET | Freq: Every day | ORAL | Status: DC
  Administered 2023-04-30 – 2023-05-05 (×6): 50 mg via ORAL
  Filled 2023-04-30: qty 2
  Filled 2023-04-30 (×2): qty 1
  Filled 2023-04-30: qty 2
  Filled 2023-04-30: qty 1
  Filled 2023-04-30: qty 2

## 2023-04-30 NOTE — Progress Notes (Addendum)
Pt refused cpap for tonight in presence of family at bedside.  RN notified.

## 2023-04-30 NOTE — Progress Notes (Signed)
Note pt is s/p palliative meeting today.  SLP reached out to RN re: pt tolerance of po today, she reports pt managing diet well - no coughing with po.  Will follow up next week if pt still present to consider RMST to strengthen pt's cough for airway clearance assistance when/if she aspirates.   Rolena Infante, MS [facility] [employer] [phone]

## 2023-04-30 NOTE — Progress Notes (Signed)
Daily Progress Note   Patient Name: Debbie Arnold       Date: 04/30/2023 DOB: [birth year]  Age: 89 y.o. MRN#: [id number] Attending Physician: Briant Cedar, MD Primary Care Physician: Martyn Malay, MD Admit Date: 04/28/2023 Length of Stay: 2 days  Reason for Consultation/Follow-up: Establishing goals of care  Subjective:   CC: Patient sleeping comfortably in bed with noted times of coughing. Spoke with son outside of room as requested.  Following up regarding complex medical decision making.  Subjective:  Reviewed EMR prior to presenting to bedside.  Patient remains on high flow nasal cannula support.  When presenting to bedside, patient sleeping comfortably laying in bed.  Patient noted to have fits of coughing at time.  Patient's son, Debbie Arnold, asked to speak with this provider outside of room and so acknowledged and respected this.  Spent lengthy time again reviewing patient's overall medical course.  Discussed patient's multiple medical comorbidities (PNA, aspiration, CHF, obesity hypoventilation syndrome, etc).  Discussed risk of aspiration.  Son inquiring about prognosis.  Spent time discussing that as someone's functional status decreases, there life expectancy usually decreases.  Due to her bedbound status with her other multiple medical comorbidities and risk of aspiration, did express concern that time is getting short.  Expressed would not be surprised if patient died within the next 6 months and believe she would be appropriate for hospice focused care.  Then spent time discussing possible pathways for medical care moving forward.  Again reviewed continuing with aggressive medical management to extend quantity of time noting patient could return to hospital for aggressive medical interventions knowing there Band-Aids that would not fix her underlying medical condition.  Also discussed patient can choose to instead focus on her comfort and quality of life moving forward.  Son  acknowledges that patient keeps saying she wants to go "home" but she has not been home in 2 years.  Spent time discussing hospice philosophy and what home hospice would and would not provide.  This typically address that should patient return to a home that is not a nursing facility, would need 24/7 support from either family or hired caregivers.  Even with hospice layered and, this does not provide 24/7 personnel at home.  Son is wondering if he should should have patient return to facility or to his house.  Unfortunately his house is also undergoing construction.  Spent lots of time answering questions regarding care and facilities are at home as able.  Son struggling with making sure that he wants to do right by patient.  Acknowledged this and empathized with difficult situation.  Also thanked for son for all he has continued to do for the patient.  Can tell he cares greatly about her because he is worrying so much about doing the right thing for her.  Debbie Arnold noted that patient did have 4 children though his sister died 2 months ago from a heart attack after and during kidney disease for a long time.  Empathized with loss.  Son also said that his other siblings are not able to support patient's care so care is going to fall onto him.  Discussed care giving support needs and again that hospice can be layered and at a facility where nursing staff can care for her needs versus him having to provide every day care.  Son is going to consider pathways for medical care and determine what he and patient want to pursue moving forward.  Son voiced great appreciation for extensive information regarding  patient's medical care at this time.  Noted palliative medicine team will continue to follow along with patient's medical journey.  Objective:   Vital Signs:  BP (!) 164/42   Pulse 84   Temp 99.4 F (37.4 C) (Oral)   Resp (!) 24   Ht 5\' 5"  (1.651 m)   Wt 126.3 kg   SpO2 97%   BMI 46.34 kg/m   Physical  Exam: General: NAD, sleeping comfortably in bed Eyes: no drainage noted HENT: dry mucous membranes Cardiovascular: RRR Respiratory: no increased work of breathing noted, not in respiratory distress, on [place] O2 Neuro: awake, interactive Psych: pleasant  Imaging:  I personally reviewed recent imaging.   Assessment & Plan:   Assessment: Patient is a 89 year old female with a past medical history of anxiety, CHF, COPD, diabetes mellitus type 2, GERD, history of COVID-19, history of recurrent UTIs, OSA, and PE who was admitted on 04/28/2023 for management of worsening shortness of breath.  Patient transferred from [facility] facility in was not guardian for evaluation.  As per EMR review, patient had recently received oral azithromycin for recurrent pneumonia management.  Patient has noted history of recurrent pneumonias since 11/2022.  Since admission, imaging showed multifocal pneumonia causing patient's acute hypoxic respiratory failure.  Patient receiving O2 support and antibiotics for management.  Palliative medicine consulted to assist with complex medical decision making.   Recommendations/Plan: # Complex medical decision making/goals of care:   -Patient resting comfortably in bed today though noted to have episodes of coughing.   -Spoke at length with patient's son, Debbie Arnold, described above in HPI.  Again reviewed possible pathways for medical care moving forward.  Expressed concern about patient's overall prognosis given her poor functional status/bedbound status, risk of aspiration, chronic underlying medical conditions.  Did express would not be surprised if patient passed away within the next 6 months making her appropriate for hospice level of care.  Discussed hospice at a facility such as long-term care versus hospice at home.  Discussed patient will need 24/7 care wherever she is at.  Son will consider all information discussed today to determine best way to support patient moving forward.                 -  Code Status: DNR    # Symptom management                -Left shoulder pain, chronic   -Continue Tylenol 1000 mg BID  # Psycho-social/Spiritual Support:  - Patient has 3 living children. One of patient's daughters passed away a coupld months ago.   # Discharge Planning: TBD  -Will plan to reach out to [facility] tomorrow to assist with discussions for son to plan care moving forward.   Discussed with: patient, patient's son Debbie Arnold  Thank you for allowing the palliative care team to participate in the care Juliette Alcide.  Alvester Morin, DO Palliative Care Provider PMT # [phone]  If patient remains symptomatic despite maximum doses, please call PMT at [phone] between 0700 and 1900. Outside of these hours, please call attending, as PMT does not have night coverage.  This provider spent a total of 65 minutes providing patient's care.  Includes review of EMR, discussing care with other staff members involved in patient's medical care, obtaining relevant history and information from patient and/or patient's family, and personal review of imaging and lab work. Greater than 50% of the time was spent counseling and coordinating care related to the above assessment and plan.    *  Please note that this is a verbal dictation therefore any spelling or grammatical errors are due to the "Dragon Medical One" system interpretation.

## 2023-04-30 NOTE — Progress Notes (Signed)
PROGRESS NOTE  [name] NWG:[id number] DOB: July 09, 1927 DOA: 04/28/2023 PCP: Martyn Malay, MD  HPI/Recap of past 24 hours: Debbie Arnold is a 89 y.o. female with medical history significant of COPD, recurrent multifocal pneumonia, restrictive lung disease, IDDM, HFpEF, HTN, remote history of PE on anticoagulation, CKD stage IIIa, hypothyroidism, GERD, sent from nursing home for evaluation of worsening of hypoxia and mentation changes. History provided by son at bedside.  Son reported that the patient has been having breathing problems for about 1 month with productive cough yellowish sputum, wheezing and was treated for pneumonia last week with azithromycin with no significant improvement.  In the ED, patient was noted to be requiring about 15 L of O2 was subsequently placed on BiPAP due to hypoxic and hypercapnic respiratory failure.  Chest x-ray showed multifocal bilateral pneumonia.  Patient was admitted for further management.    Today, patient was noted to be alert, awake, oriented.  Still requiring about 8L of oxygen.  Denies any chest pain, abdominal pain, nausea/vomiting, fever/chills.   Assessment/Plan: Principal Problem:   PNA (pneumonia) Active Problems:   COPD (chronic obstructive pulmonary disease) (HCC)   Acute on chronic diastolic CHF (congestive heart failure) (HCC)   CHF (congestive heart failure) (HCC)   Multifocal pneumonia   Acute encephalopathy   Palliative care encounter   Acute respiratory failure with hypoxia (HCC)   Goals of care, counseling/discussion   DNR (do not resuscitate)   Need for emotional support   Acute hypoxic and acute on chronic hypercapnic respiratory failure Likely multifactorial including multifocal/aspiration pneumonia, acute COPD exacerbation and possibly acute on chronic HFpEF decompensation Currently afebrile, with no leukocytosis CTA chest negative for PE but with reduced sensitivity due to body habitus, motion artifact,  multifocal airspace opacities in both lungs suspicious for multilobar pneumonia Urine strep pneumo, Legionella and both negative Continue Zosyn, doxycycline SLP consulted, appreciate recs with modified diet Aspiration precautions Supplemental O2, BiPAP as needed  Acute metabolic encephalopathy Improving Likely due to above Management as above   Possible acute COPD exacerbation Continue DuoNebs, Brovana, Pulmicort, as needed albuterol, Continue IV Solu-Medrol  Incentive spirometry Supplemental oxygen, BiPAP as needed   Possible acute on chronic HFpEF decompensation BNP 279, falsely low in obese patients Last echo in 2023 showed EF of 55 to 60%, no regional wall motion abnormalities, grade 1 diastolic dysfunction Started on IV Lasix, will hold dose for today due to a jump in creatinine Repeat chest x-ray pending Daily weights, strict I's and O's  CKD stage IIIa Noted jump in creatinine Hold Lasix for today Daily BMP   HTN Restart home Toprol, hold losartan due to jump in creatinine  Continue as needed labetalol   IDDM SSI for now, hold off PO DM meds  Hypothyroidism Continue Synthroid   Morbid obesity Estimated body mass index is 46.34 kg/m as calculated from the following:   Height as of this encounter: 5\' 5"  (1.651 m).   Weight as of this encounter: 126.3 kg.     Code Status: DNR  Family Communication: Discussed extensively with son over the phone  Disposition Plan: Status is: Inpatient Remains inpatient appropriate because: Level of care      Consultants: None  Procedures: None  Antimicrobials: Zosyn Doxycycline  DVT prophylaxis: Heparin [place]   Objective: Vitals:   04/30/23 1316 04/30/23 1330 04/30/23 1345 04/30/23 1400  BP:    (!) 164/42  Pulse: 84 85 85 84  Resp: (!) 23 (!) 27 20 (!) 24  Temp:  TempSrc:      SpO2: 95% 96% 96% 97%  Weight:      Height:        Intake/Output Summary (Last 24 hours) at 04/30/2023 1635 Last data  filed at 04/30/2023 0208 Gross per 24 hour  Intake 902.58 ml  Output 200 ml  Net 702.58 ml   Filed Weights   04/28/23 1600  Weight: 126.3 kg    Exam: General: NAD, more awake/alert Cardiovascular: S1, S2 present Respiratory: Diminished breath sounds bilaterally, bibasilar crackles Abdomen: Soft, nontender, nondistended, bowel sounds present Musculoskeletal: Trace bilateral pedal edema noted Skin: Normal Psychiatry: Normal mood     Data Reviewed: CBC: Recent Labs  Lab 04/28/23 1056 04/29/23 0331 04/30/23 0329  WBC 5.6 5.7 4.6  NEUTROABS 4.1  --  3.2  HGB 12.0 10.6* 10.2*  HCT 41.7 37.8 35.2*  MCV 104.5* 108.3* 104.5*  PLT 182 190 199   Basic Metabolic Panel: Recent Labs  Lab 04/28/23 1056 04/29/23 0331 04/30/23 0329  NA 136 138 137  K 4.4 4.0 3.7  CL 92* 90* 92*  CO2 36* 33* 33*  GLUCOSE 159* 217* 217*  BUN 25* 32* 46*  CREATININE 0.97 1.44* 1.54*  CALCIUM 8.6* 8.5* 8.2*   GFR: Estimated Creatinine Clearance: 30.5 mL/min (A) (by C-G formula based on SCr of 1.54 mg/dL (H)). Liver Function Tests: Recent Labs  Lab 04/28/23 1056  AST 16  ALT 13  ALKPHOS 28*  BILITOT 0.3  PROT 7.7  ALBUMIN 3.6   No results for input(s): "LIPASE", "AMYLASE" in the last 168 hours. No results for input(s): "AMMONIA" in the last 168 hours. Coagulation Profile: No results for input(s): "INR", "PROTIME" in the last 168 hours. Cardiac Enzymes: No results for input(s): "CKTOTAL", "CKMB", "CKMBINDEX", "TROPONINI" in the last 168 hours. BNP (last 3 results) No results for input(s): "PROBNP" in the last 8760 hours. HbA1C: Recent Labs    04/28/23 1635  HGBA1C 7.3*   CBG: Recent Labs  Lab 04/29/23 1942 04/29/23 2249 04/30/23 0755 04/30/23 1213 04/30/23 1601  GLUCAP 237* 196* 192* 225* 235*   Lipid Profile: No results for input(s): "CHOL", "HDL", "LDLCALC", "TRIG", "CHOLHDL", "LDLDIRECT" in the last 72 hours. Thyroid Function Tests: No results for input(s): "TSH",  "T4TOTAL", "FREET4", "T3FREE", "THYROIDAB" in the last 72 hours. Anemia Panel: No results for input(s): "VITAMINB12", "FOLATE", "FERRITIN", "TIBC", "IRON", "RETICCTPCT" in the last 72 hours. Urine analysis:    Component Value Date/Time   COLORURINE YELLOW 04/28/2023 1612   APPEARANCEUR CLOUDY (A) 04/28/2023 1612   LABSPEC 1.035 (H) 04/28/2023 1612   PHURINE 5.0 04/28/2023 1612   GLUCOSEU >=500 (A) 04/28/2023 1612   HGBUR MODERATE (A) 04/28/2023 1612   BILIRUBINUR NEGATIVE 04/28/2023 1612   KETONESUR NEGATIVE 04/28/2023 1612   PROTEINUR 30 (A) 04/28/2023 1612   NITRITE NEGATIVE 04/28/2023 1612   LEUKOCYTESUR LARGE (A) 04/28/2023 1612   Sepsis Labs: @LABRCNTIP (procalcitonin:4,lacticidven:4)  ) Recent Results (from the past 240 hour(s))  Blood Culture (routine x 2)     Status: None (Preliminary result)   Collection Time: 04/28/23 11:25 AM   Specimen: BLOOD  Result Value Ref Range Status   Specimen Description   Final    BLOOD BLOOD LEFT FOREARM Performed at [facility], [address], [place], [place] [address]    Special Requests   Final    Blood Culture results may not be optimal due to an inadequate volume of blood received in culture bottles BOTTLES DRAWN AEROBIC ONLY Performed at [facility], [address], [place], [place] [address]    Culture   Final    NO GROWTH 2 DAYS Performed at [facility], [address] [place], [place] [address]    Report Status PENDING  Incomplete  Blood Culture (routine x 2)     Status: None (Preliminary result)   Collection Time: 04/28/23 11:30 AM   Specimen: BLOOD  Result Value Ref Range Status   Specimen Description   Final    BLOOD RIGHT ANTECUBITAL Performed at [facility], [address], [place], [place] [address]    Special Requests   Final    BOTTLES DRAWN AEROBIC AND ANAEROBIC Blood Culture results may not be optimal due to an inadequate volume of blood  received in culture bottles Performed at [facility], [address], [place], [place] [address]    Culture   Final    NO GROWTH 2 DAYS Performed at [facility], [address], [place], [place] [address]    Report Status PENDING  Incomplete  MRSA Next Gen by PCR, Nasal     Status: None   Collection Time: 04/28/23  3:53 PM   Specimen: Nasal Mucosa; Nasal Swab  Result Value Ref Range Status   MRSA by PCR Next Gen NOT DETECTED NOT DETECTED Final    Comment: (NOTE) The GeneXpert MRSA Assay (FDA approved for NASAL specimens only), is one component of a comprehensive MRSA colonization surveillance program. It is not intended to diagnose MRSA infection nor to guide or monitor treatment for MRSA infections. Test performance is not FDA approved in patients less than 23 years old. Performed at [facility], [address], [place], [place] [address]   SARS Coronavirus 2 by RT PCR (hospital order, performed in [facility] hospital lab) *cepheid single result test* Urine, Unspecified Source     Status: None   Collection Time: 04/28/23  4:12 PM   Specimen: Urine, Unspecified Source; Nasal Swab  Result Value Ref Range Status   SARS Coronavirus 2 by RT PCR NEGATIVE NEGATIVE Final    Comment: (NOTE) SARS-CoV-2 target nucleic acids are NOT DETECTED.  The SARS-CoV-2 RNA is generally detectable in upper and lower respiratory specimens during the acute phase of infection. The lowest concentration of SARS-CoV-2 viral copies this assay can detect is 250 copies / mL. A negative result does not preclude SARS-CoV-2 infection and should not be used as the sole basis for treatment or other patient management decisions.  A negative result may occur with improper specimen collection / handling, submission of specimen other than nasopharyngeal swab, presence of viral mutation(s) within the areas targeted by this assay, and inadequate number of viral  copies (<250 copies / mL). A negative result must be combined with clinical observations, patient history, and epidemiological information.  Fact Sheet for Patients:   RoadLapTop.co.za  Fact Sheet for Healthcare Providers: http://kim-miller.com/  This test is not yet approved or  cleared by the [place] FDA and has been authorized for detection and/or diagnosis of SARS-CoV-2 by FDA under an Emergency Use Authorization (EUA).  This EUA will remain in effect (meaning this test can be used) for the duration of the COVID-19 declaration under Section 564(b)(1) of the Act, 21 U.S.C. section 360bbb-3(b)(1), unless the authorization is terminated or revoked sooner.  Performed at [facility], [address] [place], [place] [address]   Urine Culture     Status: Abnormal   Collection Time: 04/28/23  4:12 PM   Specimen: Urine, Random  Result Value Ref Range Status   Specimen Description   Final    URINE, RANDOM Performed at [facility], [address], [place], [place] [address]    Special Requests   Final    NONE Reflexed from [id number] Performed at [facility], [address], [place], [place] [address]    Culture (A)  Final    50,000 COLONIES/mL ESCHERICHIA COLI Confirmed Extended Spectrum Beta-Lactamase Producer (ESBL).  In bloodstream infections from ESBL organisms, carbapenems are preferred over piperacillin/tazobactam. They are shown to have a lower risk of mortality.    Report Status 04/30/2023 FINAL  Final   Organism ID, Bacteria ESCHERICHIA COLI (A)  Final      Susceptibility   Escherichia coli - MIC*    AMPICILLIN >=32 RESISTANT Resistant     CEFAZOLIN >=64 RESISTANT Resistant     CEFEPIME 16 RESISTANT Resistant     CIPROFLOXACIN >=4 RESISTANT Resistant     GENTAMICIN >=16 RESISTANT Resistant     IMIPENEM <=0.25 SENSITIVE Sensitive     NITROFURANTOIN <=16 SENSITIVE  Sensitive     TRIMETH/SULFA >=320 RESISTANT Resistant     AMPICILLIN/SULBACTAM >=32 RESISTANT Resistant     PIP/TAZO <=4 SENSITIVE Sensitive     * 50,000 COLONIES/mL ESCHERICHIA COLI      Studies: No results found.  Scheduled Meds:  acetaminophen  1,000 mg Oral BID   arformoterol  15 mcg Nebulization BID   aspirin  81 mg Oral Daily   budesonide  0.5 mg Nebulization BID   Chlorhexidine Gluconate Cloth  6 each Topical Daily   dorzolamide  1 drop Both Eyes BID   escitalopram  10 mg Oral Daily   fluticasone  2 spray Each Nare Daily   gabapentin  300 mg Oral BID   guaiFENesin  1,200 mg Oral BID   heparin  5,000 Units Subcutaneous Q12H   insulin aspart  0-15 Units Subcutaneous TID WC   ipratropium-albuterol  3 mL Nebulization TID   levothyroxine  125 mcg Oral QAC breakfast   methylPREDNISolone (SOLU-MEDROL) injection  40 mg Intravenous Q8H   pantoprazole  40 mg Oral Daily   polyethylene glycol  17 g Oral BID   senna  2 tablet Oral BID    Continuous Infusions:  doxycycline (VIBRAMYCIN) IV Stopped (04/30/23 1205)   piperacillin-tazobactam (ZOSYN)  IV 3.375 g (04/30/23 1313)     LOS: 2 days     Briant Cedar, MD [facility]  If 7PM-7AM, please contact night-coverage [email or web address] 04/30/2023, 4:35 PM

## 2023-04-30 NOTE — Progress Notes (Signed)
Stool sample for Cdiff remains uncollected. She had one type 6 stool that does not met consistency requirements. Dr. Sharolyn Douglas notified. No new orders.

## 2023-05-01 ENCOUNTER — Inpatient Hospital Stay (HOSPITAL_COMMUNITY): Payer: Medicare HMO

## 2023-05-01 DIAGNOSIS — M7989 Other specified soft tissue disorders: Secondary | ICD-10-CM

## 2023-05-01 DIAGNOSIS — J189 Pneumonia, unspecified organism: Secondary | ICD-10-CM | POA: Diagnosis not present

## 2023-05-01 LAB — CBC WITH DIFFERENTIAL/PLATELET
Abs Immature Granulocytes: 0.06 10*3/uL (ref 0.00–0.07)
Basophils Absolute: 0 10*3/uL (ref 0.0–0.1)
Basophils Relative: 0 %
Eosinophils Absolute: 0 10*3/uL (ref 0.0–0.5)
Eosinophils Relative: 0 %
HCT: 36.6 % (ref 36.0–46.0)
Hemoglobin: 10.3 g/dL — ABNORMAL LOW (ref 12.0–15.0)
Immature Granulocytes: 2 %
Lymphocytes Relative: 29 %
Lymphs Abs: 1.2 10*3/uL (ref 0.7–4.0)
MCH: 29.6 pg (ref 26.0–34.0)
MCHC: 28.1 g/dL — ABNORMAL LOW (ref 30.0–36.0)
MCV: 105.2 fL — ABNORMAL HIGH (ref 80.0–100.0)
Monocytes Absolute: 0.3 10*3/uL (ref 0.1–1.0)
Monocytes Relative: 7 %
Neutro Abs: 2.6 10*3/uL (ref 1.7–7.7)
Neutrophils Relative %: 62 %
Platelets: 230 10*3/uL (ref 150–400)
RBC: 3.48 MIL/uL — ABNORMAL LOW (ref 3.87–5.11)
RDW: 14.5 % (ref 11.5–15.5)
WBC: 4.1 10*3/uL (ref 4.0–10.5)
nRBC: 0 % (ref 0.0–0.2)

## 2023-05-01 LAB — CULTURE, BLOOD (ROUTINE X 2)

## 2023-05-01 LAB — BASIC METABOLIC PANEL
Anion gap: 13 (ref 5–15)
BUN: 52 mg/dL — ABNORMAL HIGH (ref 8–23)
CO2: 31 mmol/L (ref 22–32)
Calcium: 8.3 mg/dL — ABNORMAL LOW (ref 8.9–10.3)
Chloride: 92 mmol/L — ABNORMAL LOW (ref 98–111)
Creatinine, Ser: 1.34 mg/dL — ABNORMAL HIGH (ref 0.44–1.00)
GFR, Estimated: 37 mL/min — ABNORMAL LOW (ref >60)
Glucose, Bld: 255 mg/dL — ABNORMAL HIGH (ref 70–99)
Potassium: 3.5 mmol/L (ref 3.5–5.1)
Sodium: 136 mmol/L (ref 135–145)

## 2023-05-01 LAB — GLUCOSE, CAPILLARY
Glucose-Capillary: 229 mg/dL — ABNORMAL HIGH (ref 70–99)
Glucose-Capillary: 283 mg/dL — ABNORMAL HIGH (ref 70–99)
Glucose-Capillary: 253 mg/dL — ABNORMAL HIGH (ref 70–99)
Glucose-Capillary: 243 mg/dL — ABNORMAL HIGH (ref 70–99)

## 2023-05-01 MED ORDER — INSULIN GLARGINE-YFGN 100 UNIT/ML ~~LOC~~ SOLN
10.0000 [IU] | Freq: Every day | SUBCUTANEOUS | Status: DC
  Administered 2023-05-01 – 2023-05-04 (×4): 10 [IU] via SUBCUTANEOUS
  Filled 2023-05-01 (×5): qty 0.1

## 2023-05-01 MED ORDER — IPRATROPIUM-ALBUTEROL 0.5-2.5 (3) MG/3ML IN SOLN
3.0000 mL | Freq: Two times a day (BID) | RESPIRATORY_TRACT | Status: DC
  Administered 2023-05-01 – 2023-05-05 (×8): 3 mL via RESPIRATORY_TRACT
  Filled 2023-05-01 (×8): qty 3

## 2023-05-01 MED ORDER — AMLODIPINE BESYLATE 5 MG PO TABS
5.0000 mg | ORAL_TABLET | Freq: Every day | ORAL | Status: DC
  Administered 2023-05-01: 5 mg via ORAL
  Filled 2023-05-01: qty 1

## 2023-05-01 MED ORDER — FUROSEMIDE 10 MG/ML IJ SOLN
40.0000 mg | Freq: Once | INTRAMUSCULAR | Status: AC
  Administered 2023-05-01: 40 mg via INTRAVENOUS
  Filled 2023-05-01: qty 4

## 2023-05-01 MED ORDER — DOXYCYCLINE HYCLATE 100 MG PO TABS
100.0000 mg | ORAL_TABLET | Freq: Two times a day (BID) | ORAL | Status: DC
  Administered 2023-05-01 – 2023-05-05 (×9): 100 mg via ORAL
  Filled 2023-05-01 (×9): qty 1

## 2023-05-01 MED ORDER — PREDNISONE 20 MG PO TABS
40.0000 mg | ORAL_TABLET | Freq: Every day | ORAL | Status: DC
  Administered 2023-05-02 – 2023-05-05 (×4): 40 mg via ORAL
  Filled 2023-05-01 (×5): qty 2

## 2023-05-01 MED ORDER — METHYLPREDNISOLONE SODIUM SUCC 40 MG IJ SOLR: 40.0000 mg | Freq: Two times a day (BID) | INTRAMUSCULAR | Status: DC

## 2023-05-01 NOTE — Evaluation (Signed)
Physical Therapy Evaluation Patient Details Name: Debbie Arnold MRN: [id number] DOB: 1926/12/25 Today's Date: 05/01/2023  History of Present Illness  Ms. Skok is a 89 yr old female admitted to the hospital from her nursing facility with hypoxia and mentation changes. She was found to have PNA, acute metabolic encephalopathy, and acute hypoxic respiratory failure. PMH: COPD, recurrent PNA, restrictive lung disease, IDDM, heart failure, HTN, PE, CKD 3  Clinical Impression  Pt admitted as above and presents with functional mobility limitations 2* generalized weakness, obesity, balance deficits and limited bilateral knee flexion. The pt was highly motivated to participate in the therapy session and put forth great effort throughout. She repeatedly expressed having a desired goal of being able to walk again, which she has not been able to do in at least 1 year. She required 2 person assist for bed mobility and she initially presented with poor sitting balance edge of bed, given posterior leaning, for which she required cues and assist to correct; as the session progressed, she was able to maintain sitting EOB with min guard assist. Two sit to stand transfers were attempted from the EOB using the Corene Cornea, however the pt was unable to clear her buttocks off the bed during both attempts. PT will follow the pt for trial to further PT assess pt for potential progress to regain increased mobility.      Recommendations for follow up therapy are one component of a multi-disciplinary discharge planning process, led by the attending physician.  Recommendations may be updated based on patient status, additional functional criteria and insurance authorization.  Follow Up Recommendations Can patient physically be transported by private vehicle: No     Assistance Recommended at Discharge Intermittent Supervision/Assistance  Patient can return home with the following       Equipment Recommendations None  recommended by PT  Recommendations for Other Services       Functional Status Assessment Patient has had a recent decline in their functional status and demonstrates the ability to make significant improvements in function in a reasonable and predictable amount of time.     Precautions / Restrictions Precautions Precautions: Fall Restrictions Weight Bearing Restrictions: No      Mobility  Bed Mobility Overal bed mobility: Needs Assistance Bed Mobility: Supine to Sit, Sit to Supine     Supine to sit: Max assist, +2 for physical assistance Sit to supine: Total assist, +2 for physical assistance   General bed mobility comments: initial posterior drift with feet unsupported (high bed/short legs) but pt able to stabilize and maintain balance unsupported once STEDY platform under feet    Transfers Overall transfer level: Needs assistance Equipment used:  Corene Cornea)               General transfer comment: The pt was instructed on sit to stand from the EOB using the Corene Cornea device. She required cues for BLE placement on foot plate, trunk shifting forward to correct occasional posterior lean, and for hand placement on support bar. She was unable to clear her buttocks off the bed, in order to stand on 2 standing attempts    Ambulation/Gait                  Stairs            Wheelchair Mobility    Modified Rankin (Stroke Patients Only)       Balance       Sitting balance - Comments: Initially poor due to posterior  leaning, however improved to fair as the session progressed                                     Pertinent Vitals/Pain Pain Assessment Pain Assessment: Faces Faces Pain Scale: Hurts little more Pain Location: chronic BUE shoulder pain, which is worse with activity Pain Intervention(s): Limited activity within patient's tolerance, Monitored during session    Home Living Family/patient expects to be discharged to:: Skilled  nursing facility                 Home Equipment: Wheelchair - manual;Other (comment) Additional Comments: She resides at a SNF.    Prior Function Prior Level of Function : Needs assist             Mobility Comments: She has not ambulated in at least 1 year, rather the staff uses a hoyer lift to transfer her into & out of her wheelchair. She reported being unable to propel her wheelchair, due to improper wheelchair fitting, including an inability to reach the wheels with her BUE. ADLs Comments: She reported requiring increased assist from the staff for toileting at bed level (wears briefs), dressing, and bathing. She could feed herself. She has not been receiving therapy at the facility recently.     Hand Dominance   Dominant Hand: Right    Extremity/Trunk Assessment   Upper Extremity Assessment Upper Extremity Assessment: Defer to OT evaluation RUE Deficits / Details: Severe chronic shoulder AROM limitations. Elbow and hand AROM WFL. Functional grip strength LUE Deficits / Details: Severe chronic shoulder AROM limitations. Elbow and hand AROM WFL. Functional grip strength    Lower Extremity Assessment Lower Extremity Assessment: Generalized weakness;RLE deficits/detail;LLE deficits/detail RLE Deficits / Details: Knee ROM ltd to ~75 flex in sitting LLE Deficits / Details: Knee ROM ltd to ~60 degrees in sitting    Cervical / Trunk Assessment Cervical / Trunk Assessment: Normal  Communication   Communication: HOH  Cognition Arousal/Alertness: Awake/alert Behavior During Therapy: WFL for tasks assessed/performed Overall Cognitive Status: Within Functional Limits for tasks assessed                                 General Comments: Oriented to person, place, and year. Disoriented to month. Partially oriented to situation. Able to follow 1-2 step commands consistently, friendly and motivated.        General Comments      Exercises     Assessment/Plan     PT Assessment Patient needs continued PT services  PT Problem List Decreased strength;Decreased range of motion;Decreased activity tolerance;Decreased balance;Decreased mobility;Decreased knowledge of use of DME;Obesity;Pain       PT Treatment Interventions DME instruction;Functional mobility training;Therapeutic activities;Therapeutic exercise;Balance training;Patient/family education    PT Goals (Current goals can be found in the Care Plan section)  Acute Rehab PT Goals Patient Stated Goal: I want to walk or be able to propel my own WC PT Goal Formulation: With patient Time For Goal Achievement: 05/15/23 Potential to Achieve Goals: Fair    Frequency Min 1X/week     Co-evaluation PT/OT/SLP Co-Evaluation/Treatment: Yes Reason for Co-Treatment: To address functional/ADL transfers;Complexity of the patient's impairments (multi-system involvement);For patient/therapist safety PT goals addressed during session: Mobility/safety with mobility OT goals addressed during session: Strengthening/ROM       AM-PAC PT "6 Clicks" Mobility  Outcome Measure Help needed  turning from your back to your side while in a flat bed without using bedrails?: A Lot Help needed moving from lying on your back to sitting on the side of a flat bed without using bedrails?: A Lot Help needed moving to and from a bed to a chair (including a wheelchair)?: Total Help needed standing up from a chair using your arms (e.g., wheelchair or bedside chair)?: Total Help needed to walk in hospital room?: Total Help needed climbing 3-5 steps with a railing? : Total 6 Click Score: 8    End of Session Equipment Utilized During Treatment: Gait belt Activity Tolerance: Patient tolerated treatment well;Patient limited by fatigue Patient left: in bed;with call bell/phone within reach;with bed alarm set Nurse Communication: Mobility status PT Visit Diagnosis: Muscle weakness (generalized) (M62.81);Difficulty in walking, not  elsewhere classified (R26.2)    Time: 4098-1191 PT Time Calculation (min) (ACUTE ONLY): 37 min   Charges:   PT Evaluation $PT Eval Moderate Complexity: 1 Mod          Mauro Kaufmann PT Acute Rehabilitation Services Pager [phone] Office [phone]   [facility] 05/01/2023, 4:48 PM

## 2023-05-01 NOTE — Progress Notes (Signed)
RUE venous duplex has been completed.   Results can be found under chart review under CV PROC. 05/01/2023 2:53 PM Renesmay Nesbitt RVT, RDMS

## 2023-05-01 NOTE — Inpatient Diabetes Management (Addendum)
Inpatient Diabetes Program Recommendations  AACE/ADA: New Consensus Statement on Inpatient Glycemic Control (2015)  Target Ranges:  Prepandial:   less than 140 mg/dL      Peak postprandial:   less than 180 mg/dL (1-2 hours)      Critically ill patients:  140 - 180 mg/dL   Lab Results  Component Value Date   GLUCAP 229 (H) 05/01/2023   HGBA1C 7.3 (H) 04/28/2023    Review of Glycemic Control  Diabetes history: DM2 Outpatient Diabetes medications: Jardiance 10 mg QD Current orders for Inpatient glycemic control: Novolog 0-15 TID with meals On Solumedrol 40 mg Q12H  Post-prandials elevated.  Inpatient Diabetes Program Recommendations:    Consider adding Novolog 3 units TID with meals if eating > 50%  Consider Semglee 10 units QHS  Continue to follow.  Thank you. Ailene Ards, RD, LDN, CDCES Inpatient Diabetes Coordinator [phone]

## 2023-05-01 NOTE — Progress Notes (Signed)
PROGRESS NOTE  Debbie Arnold ZOX:[id number] DOB: 1927-09-23 DOA: 04/28/2023 PCP: Martyn Malay, MD  HPI/Recap of past 24 hours: Debbie Arnold is a 89 y.o. female with medical history significant of COPD, recurrent multifocal pneumonia, restrictive lung disease, IDDM, HFpEF, HTN, remote history of PE on anticoagulation, CKD stage IIIa, hypothyroidism, GERD, sent from nursing home for evaluation of worsening of hypoxia and mentation changes. History provided by son at bedside.  Son reported that the patient has been having breathing problems for about 1 month with productive cough yellowish sputum, wheezing and was treated for pneumonia last week with azithromycin with no significant improvement.  In the ED, patient was noted to be requiring about 15 L of O2 was subsequently placed on BiPAP due to hypoxic and hypercapnic respiratory failure.  Chest x-ray showed multifocal bilateral pneumonia.  Patient was admitted for further management.    Today, pt noted to be requiring less O2, more interactive.  BP still uncontrolled   Assessment/Plan: Principal Problem:   PNA (pneumonia) Active Problems:   COPD (chronic obstructive pulmonary disease) (HCC)   Acute on chronic diastolic CHF (congestive heart failure) (HCC)   CHF (congestive heart failure) (HCC)   Multifocal pneumonia   Acute encephalopathy   Palliative care encounter   Acute respiratory failure with hypoxia (HCC)   Goals of care, counseling/discussion   DNR (do not resuscitate)   Need for emotional support   Counseling and coordination of care   Acute hypoxic and acute on chronic hypercapnic respiratory failure Possibly obesity hypoventilation syndrome Currently on about 2 L of O2 Likely multifactorial including multifocal/aspiration pneumonia, acute COPD exacerbation and possibly acute on chronic HFpEF decompensation Currently afebrile, with no leukocytosis CTA chest negative for PE but with reduced sensitivity due to body  habitus, motion artifact, multifocal airspace opacities in both lungs suspicious for multilobar pneumonia Urine strep pneumo, Legionella and both negative Continue Zosyn, doxycycline SLP consulted, appreciate recs with modified diet Aspiration precautions Supplemental O2, BiPAP as needed  Acute metabolic encephalopathy Improving Likely due to above Management as above   Possible acute COPD exacerbation Continue DuoNebs, Brovana, Pulmicort, as needed albuterol, S/p IV Solu-Medrol, switch to p.o. prednisone Incentive spirometry Supplemental oxygen, BiPAP as needed   Possible acute on chronic HFpEF decompensation BNP 279, falsely low in obese patients Last echo in 2023 showed EF of 55 to 60%, no regional wall motion abnormalities, grade 1 diastolic dysfunction Will give 1 dose of IV Lasix on 5/24 Repeat chest x-ray with bilateral interstitial pulmonary opacity, consistent with edema or infection.  No new air space opacity Daily weights, strict I's and O's  CKD stage IIIa Stable Daily BMP   HTN Restart home Toprol, hold losartan due to jump in creatinine  Start amlodipine Continue as needed labetalol   IDDM SSI for now, hold off PO DM meds, semglee nightly  Hypothyroidism Continue Synthroid   Right upper extremity swelling Doppler negative for DVT   Morbid obesity Estimated body mass index is 45.82 kg/m as calculated from the following:   Height as of this encounter: 5\' 5"  (1.651 m).   Weight as of this encounter: 124.9 kg.   Goals of care discussion Poor prognosis, palliative/hospice on board Family planning for hospice    Code Status: DNR  Family Communication: Discussed with son over the phone  Disposition Plan: Status is: Inpatient Remains inpatient appropriate because: Level of care      Consultants: Palliative/hospice  Procedures: None  Antimicrobials: Zosyn Doxycycline  DVT prophylaxis: Heparin  [place]   Objective: Vitals:   05/01/23 1141  05/01/23 1327 05/01/23 1656 05/01/23 1700  BP: (!) 140/28 (!) 171/58 (!) 202/52   Pulse: 71 68 65   Resp: 17 18 20    Temp:    98.1 F (36.7 C)  TempSrc:    Oral  SpO2: 99% 96% 96%   Weight:      Height:        Intake/Output Summary (Last 24 hours) at 05/01/2023 1818 Last data filed at 05/01/2023 1656 Gross per 24 hour  Intake 120 ml  Output 1400 ml  Net -1280 ml   Filed Weights   04/28/23 1600 05/01/23 0600  Weight: 126.3 kg 124.9 kg    Exam: General: NAD, more awake/alert Cardiovascular: S1, S2 present Respiratory: Diminished breath sounds bilaterally, bibasilar crackles Abdomen: Soft, nontender, nondistended, bowel sounds present Musculoskeletal: Trace bilateral pedal edema noted Skin: Normal Psychiatry: Normal mood     Data Reviewed: CBC: Recent Labs  Lab 04/28/23 1056 04/29/23 0331 04/30/23 0329 05/01/23 0311  WBC 5.6 5.7 4.6 4.1  NEUTROABS 4.1  --  3.2 2.6  HGB 12.0 10.6* 10.2* 10.3*  HCT 41.7 37.8 35.2* 36.6  MCV 104.5* 108.3* 104.5* 105.2*  PLT 182 190 199 230   Basic Metabolic Panel: Recent Labs  Lab 04/28/23 1056 04/29/23 0331 04/30/23 0329 05/01/23 0311  NA 136 138 137 136  K 4.4 4.0 3.7 3.5  CL 92* 90* 92* 92*  CO2 36* 33* 33* 31  GLUCOSE 159* 217* 217* 255*  BUN 25* 32* 46* 52*  CREATININE 0.97 1.44* 1.54* 1.34*  CALCIUM 8.6* 8.5* 8.2* 8.3*   GFR: Estimated Creatinine Clearance: 34.9 mL/min (A) (by C-G formula based on SCr of 1.34 mg/dL (H)). Liver Function Tests: Recent Labs  Lab 04/28/23 1056  AST 16  ALT 13  ALKPHOS 28*  BILITOT 0.3  PROT 7.7  ALBUMIN 3.6   No results for input(s): "LIPASE", "AMYLASE" in the last 168 hours. No results for input(s): "AMMONIA" in the last 168 hours. Coagulation Profile: No results for input(s): "INR", "PROTIME" in the last 168 hours. Cardiac Enzymes: No results for input(s): "CKTOTAL", "CKMB", "CKMBINDEX", "TROPONINI" in the last 168 hours. BNP (last 3 results) No results for input(s):  "PROBNP" in the last 8760 hours. HbA1C: No results for input(s): "HGBA1C" in the last 72 hours.  CBG: Recent Labs  Lab 04/30/23 1601 04/30/23 2128 05/01/23 0751 05/01/23 1129 05/01/23 1650  GLUCAP 235* 245* 229* 283* 253*   Lipid Profile: No results for input(s): "CHOL", "HDL", "LDLCALC", "TRIG", "CHOLHDL", "LDLDIRECT" in the last 72 hours. Thyroid Function Tests: No results for input(s): "TSH", "T4TOTAL", "FREET4", "T3FREE", "THYROIDAB" in the last 72 hours. Anemia Panel: No results for input(s): "VITAMINB12", "FOLATE", "FERRITIN", "TIBC", "IRON", "RETICCTPCT" in the last 72 hours. Urine analysis:    Component Value Date/Time   COLORURINE YELLOW 04/28/2023 1612   APPEARANCEUR CLOUDY (A) 04/28/2023 1612   LABSPEC 1.035 (H) 04/28/2023 1612   PHURINE 5.0 04/28/2023 1612   GLUCOSEU >=500 (A) 04/28/2023 1612   HGBUR MODERATE (A) 04/28/2023 1612   BILIRUBINUR NEGATIVE 04/28/2023 1612   KETONESUR NEGATIVE 04/28/2023 1612   PROTEINUR 30 (A) 04/28/2023 1612   NITRITE NEGATIVE 04/28/2023 1612   LEUKOCYTESUR LARGE (A) 04/28/2023 1612   Sepsis Labs: @LABRCNTIP (procalcitonin:4,lacticidven:4)  ) Recent Results (from the past 240 hour(s))  Blood Culture (routine x 2)     Status: None (Preliminary result)   Collection Time: 04/28/23 11:25 AM   Specimen: BLOOD  Result Value Ref  Range Status   Specimen Description   Final    BLOOD BLOOD LEFT FOREARM Performed at [facility], [address] [place], [place] [address]    Special Requests   Final    Blood Culture results may not be optimal due to an inadequate volume of blood received in culture bottles BOTTLES DRAWN AEROBIC ONLY Performed at [facility], [address] [place], [place] [address]    Culture   Final    NO GROWTH 3 DAYS Performed at [facility], [address] [place], [place] [address]    Report Status PENDING  Incomplete  Blood Culture (routine x 2)     Status:  None (Preliminary result)   Collection Time: 04/28/23 11:30 AM   Specimen: BLOOD  Result Value Ref Range Status   Specimen Description   Final    BLOOD RIGHT ANTECUBITAL Performed at [facility], [address] [place], [place] [address]    Special Requests   Final    BOTTLES DRAWN AEROBIC AND ANAEROBIC Blood Culture results may not be optimal due to an inadequate volume of blood received in culture bottles Performed at [facility], [address], [place], [place] [address]    Culture   Final    NO GROWTH 3 DAYS Performed at [facility], [address], [place], [place] [address]    Report Status PENDING  Incomplete  MRSA Next Gen by PCR, Nasal     Status: None   Collection Time: 04/28/23  3:53 PM   Specimen: Nasal Mucosa; Nasal Swab  Result Value Ref Range Status   MRSA by PCR Next Gen NOT DETECTED NOT DETECTED Final    Comment: (NOTE) The GeneXpert MRSA Assay (FDA approved for NASAL specimens only), is one component of a comprehensive MRSA colonization surveillance program. It is not intended to diagnose MRSA infection nor to guide or monitor treatment for MRSA infections. Test performance is not FDA approved in patients less than 40 years old. Performed at [facility], [address], [place], [place] [address]   SARS Coronavirus 2 by RT PCR (hospital order, performed in [facility] hospital lab) *cepheid single result test* Urine, Unspecified Source     Status: None   Collection Time: 04/28/23  4:12 PM   Specimen: Urine, Unspecified Source; Nasal Swab  Result Value Ref Range Status   SARS Coronavirus 2 by RT PCR NEGATIVE NEGATIVE Final    Comment: (NOTE) SARS-CoV-2 target nucleic acids are NOT DETECTED.  The SARS-CoV-2 RNA is generally detectable in upper and lower respiratory specimens during the acute phase of infection. The lowest concentration of SARS-CoV-2 viral copies this assay can detect is  250 copies / mL. A negative result does not preclude SARS-CoV-2 infection and should not be used as the sole basis for treatment or other patient management decisions.  A negative result may occur with improper specimen collection / handling, submission of specimen other than nasopharyngeal swab, presence of viral mutation(s) within the areas targeted by this assay, and inadequate number of viral copies (<250 copies / mL). A negative result must be combined with clinical observations, patient history, and epidemiological information.  Fact Sheet for Patients:   RoadLapTop.co.za  Fact Sheet for Healthcare Providers: http://kim-miller.com/  This test is not yet approved or  cleared by the [place] FDA and has been authorized for detection and/or diagnosis of SARS-CoV-2 by FDA under an Emergency Use Authorization (EUA).  This EUA will remain in effect (meaning this test can be used) for the duration of the COVID-19 declaration under Section 564(b)(1) of the Act, 21 U.S.C. section 360bbb-3(b)(1), unless the authorization is terminated or revoked sooner.  Performed at [facility], [address], [place], [place] [address]   Urine Culture     Status: Abnormal   Collection Time: 04/28/23  4:12 PM   Specimen: Urine, Random  Result Value Ref Range Status   Specimen Description   Final    URINE, RANDOM Performed at [facility], [address], [place], [place] [address]    Special Requests   Final    NONE Reflexed from [id number] Performed at [facility], [address], [place], [place] [address]    Culture (A)  Final    50,000 COLONIES/mL ESCHERICHIA COLI Confirmed Extended Spectrum Beta-Lactamase Producer (ESBL).  In bloodstream infections from ESBL organisms, carbapenems are preferred over piperacillin/tazobactam. They are shown to have a lower risk of mortality.    Report  Status 04/30/2023 FINAL  Final   Organism ID, Bacteria ESCHERICHIA COLI (A)  Final      Susceptibility   Escherichia coli - MIC*    AMPICILLIN >=32 RESISTANT Resistant     CEFAZOLIN >=64 RESISTANT Resistant     CEFEPIME 16 RESISTANT Resistant     CIPROFLOXACIN >=4 RESISTANT Resistant     GENTAMICIN >=16 RESISTANT Resistant     IMIPENEM <=0.25 SENSITIVE Sensitive     NITROFURANTOIN <=16 SENSITIVE Sensitive     TRIMETH/SULFA >=320 RESISTANT Resistant     AMPICILLIN/SULBACTAM >=32 RESISTANT Resistant     PIP/TAZO <=4 SENSITIVE Sensitive     * 50,000 COLONIES/mL ESCHERICHIA COLI      Studies: VAS [place] UPPER EXTREMITY VENOUS DUPLEX  Result Date: 05/01/2023 UPPER VENOUS STUDY  Patient Name:  Debbie Arnold  Date of Exam:   05/01/2023 Medical Rec #: [id number]      Accession #:    [id number] Date of Birth: 1927/09/08      Patient Gender: F Patient Age:   41 years Exam Location:  [facility] Procedure:      VAS [place] UPPER EXTREMITY VENOUS DUPLEX Referring Phys: [facility] Adriane Guglielmo --------------------------------------------------------------------------------  Indications: Swelling Other Indications: Recent RUE IV placement. Limitations: Body habitus and poor ultrasound/tissue interface. Comparison Study: No previous exams Performing Technologist: Jody Hill RVT, RDMS  Examination Guidelines: A complete evaluation includes B-mode imaging, spectral Doppler, color Doppler, and power Doppler as needed of all accessible portions of each vessel. Bilateral testing is considered an integral part of a complete examination. Limited examinations for reoccurring indications may be performed as noted.  Right Findings: +----------+------------+---------+-----------+----------+---------------------+ RIGHT     CompressiblePhasicitySpontaneousProperties       Summary        +----------+------------+---------+-----------+----------+---------------------+ IJV           Full       No        Yes                                     +----------+------------+---------+-----------+----------+---------------------+ Subclavian               Yes       Yes                    patent by  color/doppler     +----------+------------+---------+-----------+----------+---------------------+ Axillary      Full       Yes       Yes                                    +----------+------------+---------+-----------+----------+---------------------+ Brachial      Full       Yes       Yes                                    +----------+------------+---------+-----------+----------+---------------------+ Radial        Full                                                        +----------+------------+---------+-----------+----------+---------------------+ Ulnar         Full                                                        +----------+------------+---------+-----------+----------+---------------------+ Cephalic      Full                                   only visualized in                                                            the forearm      +----------+------------+---------+-----------+----------+---------------------+ Basilic       Full       Yes       Yes                                    +----------+------------+---------+-----------+----------+---------------------+  Left Findings: +----------+------------+---------+-----------+----------+--------------------+ LEFT      CompressiblePhasicitySpontaneousProperties      Summary        +----------+------------+---------+-----------+----------+--------------------+ Subclavian               Yes       Yes                   patent by                                                              color/doppler     +----------+------------+---------+-----------+----------+--------------------+  Summary:  Right: No evidence of deep vein thrombosis in  the upper extremity. No evidence of superficial vein thrombosis in the upper extremity.  Left: No evidence of thrombosis in the subclavian.  *See table(s) above for measurements and observations.  Preliminary     Scheduled Meds:  acetaminophen  1,000 mg Oral BID   amLODipine  5 mg Oral Daily   arformoterol  15 mcg Nebulization BID   aspirin  81 mg Oral Daily   budesonide  0.5 mg Nebulization BID   Chlorhexidine Gluconate Cloth  6 each Topical Daily   dorzolamide  1 drop Both Eyes BID   doxycycline  100 mg Oral Q12H   escitalopram  10 mg Oral Daily   fluticasone  2 spray Each Nare Daily   gabapentin  300 mg Oral BID   guaiFENesin  1,200 mg Oral BID   heparin  5,000 Units Subcutaneous Q12H   insulin aspart  0-15 Units Subcutaneous TID WC   ipratropium-albuterol  3 mL Nebulization BID   levothyroxine  125 mcg Oral QAC breakfast   methylPREDNISolone (SOLU-MEDROL) injection  40 mg Intravenous Q12H   metoprolol succinate  50 mg Oral Daily   pantoprazole  40 mg Oral Daily   polyethylene glycol  17 g Oral BID   senna  2 tablet Oral BID    Continuous Infusions:  piperacillin-tazobactam (ZOSYN)  IV 3.375 g (05/01/23 1653)     LOS: 3 days     Briant Cedar, MD [facility]  If 7PM-7AM, please contact night-coverage [email or web address] 05/01/2023, 6:18 PM

## 2023-05-01 NOTE — Progress Notes (Signed)
Pt refused cpap tonight. Machine remained bedside. Family at bedside said she only use O2 at home no cpap. RN notified.

## 2023-05-01 NOTE — Progress Notes (Signed)
  Daily Progress Note   Patient Name: Debbie Arnold       Date: 05/01/2023 DOB: June 05, 1927  Age: 89 y.o. MRN#: [id number] Attending Physician: Briant Cedar, MD Primary Care Physician: Martyn Malay, MD Admit Date: 04/28/2023 Length of Stay: 3 days  Inform hospitalist and TOC of son's , [name], wishes to discuss possible avenues to support patient's care moving forward. PMT will plan to follow up tomorrow to continue discussions as able after this information has been provided. Thank you.    Alvester Morin, DO Palliative Care Provider PMT # [phone]

## 2023-05-01 NOTE — Evaluation (Signed)
Occupational Therapy Evaluation Patient Details Name: Debbie Arnold MRN: [id number] DOB: [birth year] Today's Date: 05/01/2023   History of Present Illness Debbie Arnold is a 89 yr old female admitted to the hospital from her nursing facility with hypoxia and mentation changes. She was found to have PNA, acute metabolic encephalopathy, and acute hypoxic respiratory failure. PMH: COPD, recurrent PNA, restrictive lung disease, IDDM, heart failure, HTN, PE, CKD 3   Clinical Impression   The pt was highly motivated to participate in the therapy session and she put forth great effort throughout. She repeatedly expressed having a desired goal of being able to walk again, which she has not been able to do in at least 1 year. She required 2 person assist for bed mobility, and she currently requires significant assistance for self-care tasks such as dressing, bathing, and toileting. She initially presented with poor sitting balance edge of bed, given posterior leaning, for which she required cues and assist to correct; as the session progressed, she was able to maintain sitting EOB for brief instances with min guard assist. Two sit to stand transfers were attempted from the EOB using the Corene Cornea, however the pt was unable to clear her buttocks off the bed during both attempts.  OT will follow the pt for further OT services in the acute care setting at a lower frequency, in order facilitate improved ADL participation and to decrease the risk for progressive weakness and functional decline.      Recommendations for follow up therapy are one component of a multi-disciplinary discharge planning process, led by the attending physician.  Recommendations may be updated based on patient status, additional functional criteria and insurance authorization.   Assistance Recommended at Discharge Frequent or constant Supervision/Assistance  Patient can return home with the following Two people to help with walking and/or  transfers;A lot of help with bathing/dressing/bathroom;Direct supervision/assist for medications management    Functional Status Assessment  Patient has had a recent decline in their functional status and demonstrates the ability to make significant improvements in function in a reasonable and predictable amount of time.  Equipment Recommendations  None recommended by OT       Precautions / Restrictions Precautions Precautions: Fall Restrictions Weight Bearing Restrictions: No      Mobility Bed Mobility Overal bed mobility: Needs Assistance Bed Mobility: Supine to Sit, Sit to Supine     Supine to sit: Max assist, +2 for physical assistance Sit to supine: Total assist, +2 for physical assistance        Transfers Overall transfer level: Needs assistance Equipment used:  Corene Cornea)        General transfer comment: The pt was instructed on sit to stand from the EOB using the Corene Cornea device. She required cues for BLE placement on foot plate, trunk shifting forward to correct occasional posterior lean, and for hand placement on support bar. She was unable to clear her buttocks off the bed, in order to stand on 2 standing attempts      Balance       Sitting balance - Comments: Initially poor due to posterior leaning, however improved to brief instances of fair as the session progressed             ADL either performed or assessed with clinical judgement   ADL Overall ADL's : Needs assistance/impaired Eating/Feeding: Sitting;Bed level   Grooming: Moderate assistance;Bed level Grooming Details (indicate cue type and reason): Limited by significant chronic BUE shoulder AROM limitations  Upper Body Dressing : Maximal assistance;Bed level   Lower Body Dressing: Total assistance;Bed level       Toileting- Clothing Manipulation and Hygiene: Total assistance;Bed level               Pertinent Vitals/Pain Pain Assessment Pain Assessment:  Faces Pain Score: 4  Pain Location: chronic BUE shoulder pain, which is worse with activity Pain Intervention(s): Limited activity within patient's tolerance, Monitored during session     Hand Dominance Right   Extremity/Trunk Assessment Upper Extremity Assessment Upper Extremity Assessment: RUE deficits/detail;LUE deficits/detail RUE Deficits / Details: Severe chronic shoulder AROM limitations. Elbow and hand AROM WFL. Functional grip strength LUE Deficits / Details: Severe chronic shoulder AROM limitations. Elbow and hand AROM WFL. Functional grip strength   Lower Extremity Assessment Lower Extremity Assessment: Generalized weakness       Communication Communication Communication: HOH   Cognition Arousal/Alertness: Awake/alert Behavior During Therapy: WFL for tasks assessed/performed Overall Cognitive Status: Within Functional Limits for tasks assessed          General Comments: Oriented to person, place, and year. Disoriented to month. Partially oriented to situation. Able to follow 1-2 step commands consistently, friendly and motivated.                Home Living Family/patient expects to be discharged to:: Skilled nursing facility        Home Equipment: Wheelchair - manual;Other (comment) Michiel Sites lift)   Additional Comments: She resides at a SNF.      Prior Functioning/Environment Prior Level of Function : Needs assist             Mobility Comments: She has not ambulated in at least 1 year, rather the staff uses a hoyer lift to transfer her into & out of her wheelchair. She reported being unable to propel her wheelchair, due to improper wheelchair fitting, including an inability to reach the wheels with her BUE. ADLs Comments: She reported requiring increased assist from the staff for toileting at bed level (wears briefs), dressing, and bathing. She could feed herself. She has not been receiving therapy at the facility recently.        OT Problem List:  Decreased strength;Decreased range of motion;Decreased activity tolerance;Impaired balance (sitting and/or standing);Decreased knowledge of use of DME or AE;Obesity;Pain      OT Treatment/Interventions: Self-care/ADL training;Therapeutic exercise;Energy conservation;DME and/or AE instruction;Therapeutic activities;Balance training;Patient/family education    OT Goals(Current goals can be found in the care plan section) Acute Rehab OT Goals Patient Stated Goal: To be able to walk again OT Goal Formulation: With patient Time For Goal Achievement: 05/15/23 Potential to Achieve Goals: Fair ADL Goals Pt Will Perform Grooming: with min assist;sitting Pt Will Perform Upper Body Dressing: bed level;with mod assist Additional ADL Goal #1: The pt will perform bed mobility with min assist, in prep for progressive ADL participation. Additional ADL Goal #2: The pt will sit EOB with no more than min guard assist for 10-15 minutes, in prep for progressive ADL participation.  OT Frequency: Min 1X/week    Co-evaluation PT/OT/SLP Co-Evaluation/Treatment: Yes Reason for Co-Treatment: To address functional/ADL transfers;Complexity of the patient's impairments (multi-system involvement) PT goals addressed during session: Mobility/safety with mobility OT goals addressed during session: Strengthening/ROM      AM-PAC OT "6 Clicks" Daily Activity     Outcome Measure Help from another person eating meals?: None Help from another person taking care of personal grooming?: A Lot Help from another person toileting, which includes using toliet, bedpan,  or urinal?: Total Help from another person bathing (including washing, rinsing, drying)?: A Lot Help from another person to put on and taking off regular upper body clothing?: A Lot Help from another person to put on and taking off regular lower body clothing?: Total 6 Click Score: 12   End of Session Equipment Utilized During Treatment: Gait belt;Other (comment)  Corene Cornea) Nurse Communication: Mobility status;Need for lift equipment  Activity Tolerance: Other (comment) (Fair tolerance) Patient left: in bed;with call bell/phone within reach;with bed alarm set  OT Visit Diagnosis: Muscle weakness (generalized) (M62.81);Pain                Time: 1610-9604 OT Time Calculation (min): 37 min Charges:  OT General Charges $OT Visit: 1 Visit OT Evaluation $OT Eval Moderate Complexity: 1 Mod    Geneve Kimpel L Florabelle Cardin, OTR/L 05/01/2023, 3:16 PM

## 2023-05-01 NOTE — TOC Progression Note (Signed)
Transition of Care [facility]) - Progression Note    Patient Details  Name: Debbie Arnold MRN: [id number] Date of Birth: 1927/10/16  Transition of Care [facility]) CM/SW Contact  Adrian Prows, RN Phone Number: 05/01/2023, 4:25 PM  Clinical Narrative:    Notified pt's son would like to discuss insurance support, and LTC options; spoke w/ pt's son Charon Burgen [phone]); Mr Macklin clarified pt is a long-term care resident at [facility]; he said he would like info to give his family (home w/ hospice vs returning to long-term care w/ hospice); explained facility may have contracted agency to provide hospice services; also explained list of hospice agencies can be provided, but he would have to choose agency, and services provided may vary; Mr Delphia also encouraged to contact pt's insurance to see exactly what costs are covered; he verbalized understanding and will discuss w/ his family; TOC will follow.   Expected Discharge Plan:  (TBD) Barriers to Discharge: Continued Medical Work up  Expected Discharge Plan and Services   Discharge Planning Services: CM Consult Post Acute Care Choice:  (TBD) Living arrangements for the past 2 months: Skilled Nursing Facility                                       Social Determinants of Health (SDOH) Interventions SDOH Screenings   Food Insecurity: No Food Insecurity (04/28/2023)  Housing: Low Risk  (04/28/2023)  Transportation Needs: No Transportation Needs (04/28/2023)  Utilities: Not At Risk (04/28/2023)  Tobacco Use: Low Risk  (04/28/2023)    Readmission Risk Interventions    04/29/2023   10:04 AM  Readmission Risk Prevention Plan  Transportation Screening Complete  PCP or Specialist Appt within 3-5 Days Complete  HRI or Home Care Consult Complete  Social Work Consult for Recovery Care Planning/Counseling Complete  Palliative Care Screening Complete  Medication Review Oceanographer) Complete

## 2023-05-02 DIAGNOSIS — J189 Pneumonia, unspecified organism: Secondary | ICD-10-CM | POA: Diagnosis not present

## 2023-05-02 DIAGNOSIS — Z515 Encounter for palliative care: Secondary | ICD-10-CM | POA: Diagnosis not present

## 2023-05-02 DIAGNOSIS — I5033 Acute on chronic diastolic (congestive) heart failure: Secondary | ICD-10-CM | POA: Diagnosis not present

## 2023-05-02 DIAGNOSIS — J9601 Acute respiratory failure with hypoxia: Secondary | ICD-10-CM | POA: Diagnosis not present

## 2023-05-02 LAB — GLUCOSE, CAPILLARY
Glucose-Capillary: 144 mg/dL — ABNORMAL HIGH (ref 70–99)
Glucose-Capillary: 205 mg/dL — ABNORMAL HIGH (ref 70–99)
Glucose-Capillary: 254 mg/dL — ABNORMAL HIGH (ref 70–99)
Glucose-Capillary: 243 mg/dL — ABNORMAL HIGH (ref 70–99)

## 2023-05-02 LAB — BASIC METABOLIC PANEL
Anion gap: 10 (ref 5–15)
BUN: 52 mg/dL — ABNORMAL HIGH (ref 8–23)
CO2: 33 mmol/L — ABNORMAL HIGH (ref 22–32)
Calcium: 8.2 mg/dL — ABNORMAL LOW (ref 8.9–10.3)
Chloride: 94 mmol/L — ABNORMAL LOW (ref 98–111)
Creatinine, Ser: 1.41 mg/dL — ABNORMAL HIGH (ref 0.44–1.00)
GFR, Estimated: 35 mL/min — ABNORMAL LOW (ref >60)
Glucose, Bld: 198 mg/dL — ABNORMAL HIGH (ref 70–99)
Potassium: 3.1 mmol/L — ABNORMAL LOW (ref 3.5–5.1)
Sodium: 137 mmol/L (ref 135–145)

## 2023-05-02 LAB — CBC WITH DIFFERENTIAL/PLATELET
Abs Immature Granulocytes: 0.06 10*3/uL (ref 0.00–0.07)
Basophils Absolute: 0 10*3/uL (ref 0.0–0.1)
Basophils Relative: 0 %
Eosinophils Absolute: 0 10*3/uL (ref 0.0–0.5)
Eosinophils Relative: 0 %
HCT: 35.3 % — ABNORMAL LOW (ref 36.0–46.0)
Hemoglobin: 10 g/dL — ABNORMAL LOW (ref 12.0–15.0)
Immature Granulocytes: 1 %
Lymphocytes Relative: 41 %
Lymphs Abs: 2.4 10*3/uL (ref 0.7–4.0)
MCH: 30 pg (ref 26.0–34.0)
MCHC: 28.3 g/dL — ABNORMAL LOW (ref 30.0–36.0)
MCV: 106 fL — ABNORMAL HIGH (ref 80.0–100.0)
Monocytes Absolute: 0.6 10*3/uL (ref 0.1–1.0)
Monocytes Relative: 11 %
Neutro Abs: 2.7 10*3/uL (ref 1.7–7.7)
Neutrophils Relative %: 47 %
Platelets: 223 10*3/uL (ref 150–400)
RBC: 3.33 MIL/uL — ABNORMAL LOW (ref 3.87–5.11)
RDW: 13.9 % (ref 11.5–15.5)
WBC: 5.7 10*3/uL (ref 4.0–10.5)
nRBC: 0 % (ref 0.0–0.2)

## 2023-05-02 MED ORDER — AMLODIPINE BESYLATE 10 MG PO TABS
10.0000 mg | ORAL_TABLET | Freq: Every day | ORAL | Status: DC
  Administered 2023-05-02 – 2023-05-05 (×4): 10 mg via ORAL
  Filled 2023-05-02 (×4): qty 1

## 2023-05-02 MED ORDER — LOSARTAN POTASSIUM 25 MG PO TABS
25.0000 mg | ORAL_TABLET | Freq: Every day | ORAL | Status: DC
  Administered 2023-05-02 – 2023-05-03 (×2): 25 mg via ORAL
  Filled 2023-05-02 (×2): qty 1

## 2023-05-02 MED ORDER — FUROSEMIDE 10 MG/ML IJ SOLN
40.0000 mg | Freq: Once | INTRAMUSCULAR | Status: AC
  Administered 2023-05-02: 40 mg via INTRAVENOUS
  Filled 2023-05-02: qty 4

## 2023-05-02 MED ORDER — POTASSIUM CHLORIDE CRYS ER 20 MEQ PO TBCR
40.0000 meq | EXTENDED_RELEASE_TABLET | Freq: Two times a day (BID) | ORAL | Status: AC
  Administered 2023-05-02 (×2): 40 meq via ORAL
  Filled 2023-05-02 (×2): qty 2

## 2023-05-02 NOTE — Progress Notes (Signed)
Daily Progress Note   Patient Name: Debbie Arnold       Date: 05/02/2023 DOB: 07-26-1927  Age: 89 y.o. MRN#: [id number] Attending Physician: Briant Cedar, MD Primary Care Physician: Martyn Malay, MD Admit Date: 04/28/2023 Length of Stay: 4 days  Reason for Consultation/Follow-up: Establishing goals of care  Subjective:   CC: Patient laying in bed comfortably when seen today.  No family present at bedside at time of visit. Following up regarding complex medical decision making.  Subjective:  Reviewed EMR prior to presenting to bedside.  TOC did meet with son on 5/24 to provide information and answer questions. PT worked worked with patient yesterday as well; review of note showing patient's needs lots of assistance, even with sitting up.   Presented to bedside this afternoon to check on the patient.  Visiting, no family at bedside.  Patient awake and lying comfortably in bed.  Again introduced myself as a number of the palliative medicine team.  Patient denying any symptoms of concern at this time.  Patient noting appreciation and thanking God for today.  Vent time providing emotional support via active listening.  Inquired where patient's son, Debbie Arnold, had gone and she was unsure of this though knew he would not go too far as he is her main support.  Acknowledged this and voiced appreciation for all that Debbie Arnold does for her which she acknowledged.  Thanked patient for allowing me to visit today.  Noted palliative medicine team will continue to follow-up with patient's medical journey.  Discussed care with bedside RN for updates as well.   Objective:   Vital Signs:  BP (!) 160/71 (BP Location: Left Arm)   Pulse 63   Temp 98.2 F (36.8 C) (Oral)   Resp 15   Ht 5\' 5"  (1.651 m)   Wt 124.9 kg   SpO2 93%   BMI 45.82 kg/m   Physical Exam: General: NAD, comfortably in bed, pleasant Eyes: no drainage noted HENT: dry mucous membranes Cardiovascular: RRR Respiratory: no increased  work of breathing noted, not in respiratory distress, on [place] O2 Neuro: awake, interactive Psych: pleasant  Imaging:  I personally reviewed recent imaging.   Assessment & Plan:   Assessment: Patient is a 89 year old female with a past medical history of anxiety, CHF, COPD, diabetes mellitus type 2, GERD, history of COVID-19, history of recurrent UTIs, OSA, and PE who was admitted on 04/28/2023 for management of worsening shortness of breath.  Patient transferred from [facility] facility in was not guardian for evaluation.  As per EMR review, patient had recently received oral azithromycin for recurrent pneumonia management.  Patient has noted history of recurrent pneumonias since 11/2022.  Since admission, imaging showed multifocal pneumonia causing patient's acute hypoxic respiratory failure.  Patient receiving O2 support and antibiotics for management.  Palliative medicine consulted to assist with complex medical decision making.   Recommendations/Plan: # Complex medical decision making/goals of care:   -Patient resting comfortably in bed today though noted to have episodes of coughing.   -Spoke at length with patient's son, Debbie Arnold,on 5/23.  Please see note for further details which in general discussed possible pathways for medical care moving forward.  Expressed concern about patient's overall prognosis given her poor functional status/bedbound status, risk of aspiration, chronic underlying medical conditions.  Did express would not be surprised if patient passed away within the next 6 months making her appropriate for hospice level of care.  Discussed hospice at a facility such as long-term care versus hospice at  home.  Discussed patient will need 24/7 care wherever she is at.  Son will consider all information discussed today to determine best way to support patient moving forward whether it is with PT/OT at facility, hospice at a facility, or hospice at home.  TOC did meet with son as requested on  5/24 to provide information for him to review.  Unfortunately son not present at bedside today during visit.  Noted palliative medicine team will continue to follow along for discussions as able.                -  Code Status: DNR    # Symptom management                -Left shoulder pain, chronic   -Continue Tylenol 1000 mg BID  # Psycho-social/Spiritual Support:  - Patient has 3 living children. One of patient's daughters passed away a couple months ago.   # Discharge Planning: TBD  Discussed with: patient, bedside RN  Thank you for allowing the palliative care team to participate in the care Debbie Arnold.  Alvester Morin, DO Palliative Care Provider PMT # [phone]  If patient remains symptomatic despite maximum doses, please call PMT at [phone] between 0700 and 1900. Outside of these hours, please call attending, as PMT does not have night coverage.  This provider spent a total of 25 minutes providing patient's care.  Includes review of EMR, discussing care with other staff members involved in patient's medical care, obtaining relevant history and information from patient and/or patient's family, and personal review of imaging and lab work. Greater than 50% of the time was spent counseling and coordinating care related to the above assessment and plan.    *Please note that this is a verbal dictation therefore any spelling or grammatical errors are due to the "[employer]" system interpretation.

## 2023-05-02 NOTE — Progress Notes (Signed)
Notified MD Ezenduka in regards to patient's HTN continuing despite scheduled antihypertensives and prn labetalol. MD Sharolyn Douglas increased dose of amlodipine to 10mg  and added lozartan 25mg . Will continue to monitor and assess.

## 2023-05-02 NOTE — Progress Notes (Addendum)
PROGRESS NOTE  Debbie Arnold QIO:[id number] DOB: [birth year] DOA: 04/28/2023 PCP: Martyn Malay, MD  HPI/Recap of past 24 hours: Debbie Arnold is a 89 y.o. female with medical history significant of COPD, recurrent multifocal pneumonia, restrictive lung disease, IDDM, HFpEF, HTN, remote history of PE on anticoagulation, CKD stage IIIa, hypothyroidism, GERD, sent from nursing home for evaluation of worsening of hypoxia and mentation changes. History provided by son at bedside.  Son reported that the patient has been having breathing problems for about 1 month with productive cough yellowish sputum, wheezing and was treated for pneumonia last week with azithromycin with no significant improvement.  In the ED, patient was noted to be requiring about 15 L of O2 was subsequently placed on BiPAP due to hypoxic and hypercapnic respiratory failure.  Chest x-ray showed multifocal bilateral pneumonia.  Patient was admitted for further management.    Today, pt denies any new complaints. Continues to be more interactive. Son at bedside.    Assessment/Plan: Principal Problem:   PNA (pneumonia) Active Problems:   COPD (chronic obstructive pulmonary disease) (HCC)   Acute on chronic diastolic CHF (congestive heart failure) (HCC)   CHF (congestive heart failure) (HCC)   Multifocal pneumonia   Acute encephalopathy   Palliative care encounter   Acute respiratory failure with hypoxia (HCC)   Goals of care, counseling/discussion   DNR (do not resuscitate)   Need for emotional support   Counseling and coordination of care   Acute hypoxic and acute on chronic hypercapnic respiratory failure Possibly obesity hypoventilation syndrome Currently on about 2 L of O2 Likely multifactorial including multifocal/aspiration pneumonia, acute COPD exacerbation and possibly acute on chronic HFpEF decompensation Currently afebrile, with no leukocytosis CTA chest negative for PE but with reduced sensitivity due to  body habitus, motion artifact, multifocal airspace opacities in both lungs suspicious for multilobar pneumonia Urine strep pneumo, Legionella and both negative Continue Zosyn, doxycycline SLP consulted, appreciate recs with modified diet Aspiration precautions Supplemental O2, BiPAP as needed  Acute metabolic encephalopathy Improving Likely due to above Management as above   Possible acute COPD exacerbation Continue DuoNebs, Brovana, Pulmicort, as needed albuterol, S/p IV Solu-Medrol, switch to p.o. prednisone Incentive spirometry Supplemental oxygen, BiPAP as needed   Possible acute on chronic HFpEF decompensation BNP 279, falsely low in obese patients Last echo in 2023 showed EF of 55 to 60%, no regional wall motion abnormalities, grade 1 diastolic dysfunction Will give 1 dose of IV Lasix on 5/25 Repeat chest x-ray with bilateral interstitial pulmonary opacity, consistent with edema or infection.  No new air space opacity Daily weights, strict I's and O's  Hypokalemia Replace as needed  CKD stage IIIa Stable Daily BMP  Anemia of chronic kidney disease Hemoglobin stable Daily CBC   HTN Restart home Toprol, hold losartan due to jump in creatinine  Start amlodipine Continue as needed labetalol   IDDM, with hyperglycemia Worsened by steroids SSI for now, hold off PO DM meds, semglee nightly  Hypothyroidism Continue Synthroid   Right upper extremity swelling Doppler negative for DVT   Morbid obesity Estimated body mass index is 45.82 kg/m as calculated from the following:   Height as of this encounter: 5\' 5"  (1.651 m).   Weight as of this encounter: 124.9 kg.   Goals of care discussion Poor prognosis, palliative/hospice on board Family planning for hospice    Code Status: DNR  Family Communication: Discussed with son at bedside  Disposition Plan: Status is: Inpatient Remains inpatient appropriate because: Level  of  care      Consultants: Palliative/hospice  Procedures: None  Antimicrobials: Zosyn Doxycycline  DVT prophylaxis: Heparin Jennings   Objective: Vitals:   05/02/23 1300 05/02/23 1400 05/02/23 1600 05/02/23 1612  BP: (!) 181/57 (!) 172/69 (!) 175/61   Pulse: 70 66 66   Resp: (!) 21 17 18    Temp:    99 F (37.2 C)  TempSrc:    Oral  SpO2: 92% 92% 92%   Weight:      Height:        Intake/Output Summary (Last 24 hours) at 05/02/2023 1612 Last data filed at 05/02/2023 1400 Gross per 24 hour  Intake 472.62 ml  Output 2181 ml  Net -1708.38 ml   Filed Weights   04/28/23 1600 05/01/23 0600  Weight: 126.3 kg 124.9 kg    Exam: General: NAD, more awake/alert Cardiovascular: S1, S2 present Respiratory: CTAB, bibasilar crackles Abdomen: Soft, nontender, nondistended, bowel sounds present Musculoskeletal: Trace bilateral pedal edema noted Skin: Normal Psychiatry: Normal mood     Data Reviewed: CBC: Recent Labs  Lab 04/28/23 1056 04/29/23 0331 04/30/23 0329 05/01/23 0311 05/02/23 0310  WBC 5.6 5.7 4.6 4.1 5.7  NEUTROABS 4.1  --  3.2 2.6 2.7  HGB 12.0 10.6* 10.2* 10.3* 10.0*  HCT 41.7 37.8 35.2* 36.6 35.3*  MCV 104.5* 108.3* 104.5* 105.2* 106.0*  PLT 182 190 199 230 223   Basic Metabolic Panel: Recent Labs  Lab 04/28/23 1056 04/29/23 0331 04/30/23 0329 05/01/23 0311 05/02/23 0310  NA 136 138 137 136 137  K 4.4 4.0 3.7 3.5 3.1*  CL 92* 90* 92* 92* 94*  CO2 36* 33* 33* 31 33*  GLUCOSE 159* 217* 217* 255* 198*  BUN 25* 32* 46* 52* 52*  CREATININE 0.97 1.44* 1.54* 1.34* 1.41*  CALCIUM 8.6* 8.5* 8.2* 8.3* 8.2*   GFR: Estimated Creatinine Clearance: 33.1 mL/min (A) (by C-G formula based on SCr of 1.41 mg/dL (H)). Liver Function Tests: Recent Labs  Lab 04/28/23 1056  AST 16  ALT 13  ALKPHOS 28*  BILITOT 0.3  PROT 7.7  ALBUMIN 3.6   No results for input(s): "LIPASE", "AMYLASE" in the last 168 hours. No results for input(s): "AMMONIA" in the last  168 hours. Coagulation Profile: No results for input(s): "INR", "PROTIME" in the last 168 hours. Cardiac Enzymes: No results for input(s): "CKTOTAL", "CKMB", "CKMBINDEX", "TROPONINI" in the last 168 hours. BNP (last 3 results) No results for input(s): "PROBNP" in the last 8760 hours. HbA1C: No results for input(s): "HGBA1C" in the last 72 hours.  CBG: Recent Labs  Lab 05/01/23 1650 05/01/23 2158 05/02/23 0751 05/02/23 1110 05/02/23 1610  GLUCAP 253* 243* 144* 205* 254*   Lipid Profile: No results for input(s): "CHOL", "HDL", "LDLCALC", "TRIG", "CHOLHDL", "LDLDIRECT" in the last 72 hours. Thyroid Function Tests: No results for input(s): "TSH", "T4TOTAL", "FREET4", "T3FREE", "THYROIDAB" in the last 72 hours. Anemia Panel: No results for input(s): "VITAMINB12", "FOLATE", "FERRITIN", "TIBC", "IRON", "RETICCTPCT" in the last 72 hours. Urine analysis:    Component Value Date/Time   COLORURINE YELLOW 04/28/2023 1612   APPEARANCEUR CLOUDY (A) 04/28/2023 1612   LABSPEC 1.035 (H) 04/28/2023 1612   PHURINE 5.0 04/28/2023 1612   GLUCOSEU >=500 (A) 04/28/2023 1612   HGBUR MODERATE (A) 04/28/2023 1612   BILIRUBINUR NEGATIVE 04/28/2023 1612   KETONESUR NEGATIVE 04/28/2023 1612   PROTEINUR 30 (A) 04/28/2023 1612   NITRITE NEGATIVE 04/28/2023 1612   LEUKOCYTESUR LARGE (A) 04/28/2023 1612   Sepsis Labs: @LABRCNTIP (procalcitonin:4,lacticidven:4)  )  Recent Results (from the past 240 hour(s))  Blood Culture (routine x 2)     Status: None (Preliminary result)   Collection Time: 04/28/23 11:25 AM   Specimen: BLOOD  Result Value Ref Range Status   Specimen Description   Final    BLOOD BLOOD LEFT FOREARM Performed at [facility], [address], [place], [place] [address]    Special Requests   Final    Blood Culture results may not be optimal due to an inadequate volume of blood received in culture bottles BOTTLES DRAWN AEROBIC ONLY Performed at [facility], [address] [place], [place] [address]    Culture   Final    NO GROWTH 4 DAYS Performed at [facility] [place] [facility], [address], [place], [place] [address]    Report Status PENDING  Incomplete  Blood Culture (routine x 2)     Status: None (Preliminary result)   Collection Time: 04/28/23 11:30 AM   Specimen: BLOOD  Result Value Ref Range Status   Specimen Description   Final    BLOOD RIGHT ANTECUBITAL Performed at [facility], [address], [place], [place] [address]    Special Requests   Final    BOTTLES DRAWN AEROBIC AND ANAEROBIC Blood Culture results may not be optimal due to an inadequate volume of blood received in culture bottles Performed at [facility], [address] [place], [place] [address]    Culture   Final    NO GROWTH 4 DAYS Performed at [facility], [address], [place], [place] [address]    Report Status PENDING  Incomplete  MRSA Next Gen by PCR, Nasal     Status: None   Collection Time: 04/28/23  3:53 PM   Specimen: Nasal Mucosa; Nasal Swab  Result Value Ref Range Status   MRSA by PCR Next Gen NOT DETECTED NOT DETECTED Final    Comment: (NOTE) The GeneXpert MRSA Assay (FDA approved for NASAL specimens only), is one component of a comprehensive MRSA colonization surveillance program. It is not intended to diagnose MRSA infection nor to guide or monitor treatment for MRSA infections. Test performance is not FDA approved in patients less than 75 years old. Performed at [facility], [address] [place], [place] [address]   SARS Coronavirus 2 by RT PCR (hospital order, performed in [facility] hospital lab) *cepheid single result test* Urine, Unspecified Source     Status: None   Collection Time: 04/28/23  4:12 PM   Specimen: Urine, Unspecified Source; Nasal Swab  Result Value Ref Range Status   SARS Coronavirus 2 by RT PCR NEGATIVE NEGATIVE Final    Comment:  (NOTE) SARS-CoV-2 target nucleic acids are NOT DETECTED.  The SARS-CoV-2 RNA is generally detectable in upper and lower respiratory specimens during the acute phase of infection. The lowest concentration of SARS-CoV-2 viral copies this assay can detect is 250 copies / mL. A negative result does not preclude SARS-CoV-2 infection and should not be used as the sole basis for treatment or other patient management decisions.  A negative result may occur with improper specimen collection / handling, submission of specimen other than nasopharyngeal swab, presence of viral mutation(s) within the areas targeted by this assay, and inadequate number of viral copies (<250 copies / mL). A negative result must be combined with clinical observations, patient history, and epidemiological information.  Fact Sheet for Patients:   RoadLapTop.co.za  Fact Sheet for  Healthcare Providers: http://kim-miller.com/  This test is not yet approved or  cleared by the [place] and has been authorized for detection and/or diagnosis of SARS-CoV-2 by FDA under an Emergency Use Authorization (EUA).  This EUA will remain in effect (meaning this test can be used) for the duration of the COVID-19 declaration under Section 564(b)(1) of the Act, 21 U.S.C. section 360bbb-3(b)(1), unless the authorization is terminated or revoked sooner.  Performed at [facility], [address], [place], [place] [address]   Urine Culture     Status: Abnormal   Collection Time: 04/28/23  4:12 PM   Specimen: Urine, Random  Result Value Ref Range Status   Specimen Description   Final    URINE, RANDOM Performed at [facility], [address], [place], [place] [address]    Special Requests   Final    NONE Reflexed from [id number] Performed at [facility], [address], [place], [place] [address]    Culture (A)  Final     50,000 COLONIES/mL ESCHERICHIA COLI Confirmed Extended Spectrum Beta-Lactamase Producer (ESBL).  In bloodstream infections from ESBL organisms, carbapenems are preferred over piperacillin/tazobactam. They are shown to have a lower risk of mortality.    Report Status 04/30/2023 FINAL  Final   Organism ID, Bacteria ESCHERICHIA COLI (A)  Final      Susceptibility   Escherichia coli - MIC*    AMPICILLIN >=32 RESISTANT Resistant     CEFAZOLIN >=64 RESISTANT Resistant     CEFEPIME 16 RESISTANT Resistant     CIPROFLOXACIN >=4 RESISTANT Resistant     GENTAMICIN >=16 RESISTANT Resistant     IMIPENEM <=0.25 SENSITIVE Sensitive     NITROFURANTOIN <=16 SENSITIVE Sensitive     TRIMETH/SULFA >=320 RESISTANT Resistant     AMPICILLIN/SULBACTAM >=32 RESISTANT Resistant     PIP/TAZO <=4 SENSITIVE Sensitive     * 50,000 COLONIES/mL ESCHERICHIA COLI      Studies: No results found.  Scheduled Meds:  acetaminophen  1,000 mg Oral BID   amLODipine  10 mg Oral Daily   arformoterol  15 mcg Nebulization BID   aspirin  81 mg Oral Daily   budesonide  0.5 mg Nebulization BID   Chlorhexidine Gluconate Cloth  6 each Topical Daily   dorzolamide  1 drop Both Eyes BID   doxycycline  100 mg Oral Q12H   escitalopram  10 mg Oral Daily   fluticasone  2 spray Each Nare Daily   gabapentin  300 mg Oral BID   guaiFENesin  1,200 mg Oral BID   heparin  5,000 Units Subcutaneous Q12H   insulin aspart  0-15 Units Subcutaneous TID WC   insulin glargine-yfgn  10 Units Subcutaneous QHS   ipratropium-albuterol  3 mL Nebulization BID   levothyroxine  125 mcg Oral QAC breakfast   losartan  25 mg Oral Daily   metoprolol succinate  50 mg Oral Daily   pantoprazole  40 mg Oral Daily   polyethylene glycol  17 g Oral BID   potassium chloride  40 mEq Oral BID   predniSONE  40 mg Oral Q breakfast   senna  2 tablet Oral BID    Continuous Infusions:  piperacillin-tazobactam (ZOSYN)  IV 3.375 g (05/02/23 1514)     LOS: 4 days      Briant Cedar, MD [facility]  If 7PM-7AM, please contact night-coverage [email or web address] 05/02/2023, 4:12 PM

## 2023-05-03 DIAGNOSIS — J9601 Acute respiratory failure with hypoxia: Secondary | ICD-10-CM | POA: Diagnosis not present

## 2023-05-03 DIAGNOSIS — Z515 Encounter for palliative care: Secondary | ICD-10-CM | POA: Diagnosis not present

## 2023-05-03 DIAGNOSIS — J189 Pneumonia, unspecified organism: Secondary | ICD-10-CM | POA: Diagnosis not present

## 2023-05-03 DIAGNOSIS — Z7189 Other specified counseling: Secondary | ICD-10-CM | POA: Diagnosis not present

## 2023-05-03 LAB — GLUCOSE, CAPILLARY
Glucose-Capillary: 141 mg/dL — ABNORMAL HIGH (ref 70–99)
Glucose-Capillary: 200 mg/dL — ABNORMAL HIGH (ref 70–99)
Glucose-Capillary: 265 mg/dL — ABNORMAL HIGH (ref 70–99)

## 2023-05-03 LAB — BASIC METABOLIC PANEL
Anion gap: 10 (ref 5–15)
BUN: 43 mg/dL — ABNORMAL HIGH (ref 8–23)
CO2: 32 mmol/L (ref 22–32)
Calcium: 8.5 mg/dL — ABNORMAL LOW (ref 8.9–10.3)
Chloride: 96 mmol/L — ABNORMAL LOW (ref 98–111)
Creatinine, Ser: 1.08 mg/dL — ABNORMAL HIGH (ref 0.44–1.00)
GFR, Estimated: 48 mL/min — ABNORMAL LOW (ref >60)
Glucose, Bld: 177 mg/dL — ABNORMAL HIGH (ref 70–99)
Potassium: 4 mmol/L (ref 3.5–5.1)
Sodium: 138 mmol/L (ref 135–145)

## 2023-05-03 LAB — CBC WITH DIFFERENTIAL/PLATELET
Abs Immature Granulocytes: 0.05 10*3/uL (ref 0.00–0.07)
Basophils Absolute: 0 10*3/uL (ref 0.0–0.1)
Basophils Relative: 0 %
Eosinophils Absolute: 0 10*3/uL (ref 0.0–0.5)
Eosinophils Relative: 0 %
HCT: 36 % (ref 36.0–46.0)
Hemoglobin: 10.4 g/dL — ABNORMAL LOW (ref 12.0–15.0)
Immature Granulocytes: 1 %
Lymphocytes Relative: 36 %
Lymphs Abs: 1.9 10*3/uL (ref 0.7–4.0)
MCH: 30.1 pg (ref 26.0–34.0)
MCHC: 28.9 g/dL — ABNORMAL LOW (ref 30.0–36.0)
MCV: 104.3 fL — ABNORMAL HIGH (ref 80.0–100.0)
Monocytes Absolute: 0.5 10*3/uL (ref 0.1–1.0)
Monocytes Relative: 10 %
Neutro Abs: 2.8 10*3/uL (ref 1.7–7.7)
Neutrophils Relative %: 53 %
Platelets: 215 10*3/uL (ref 150–400)
RBC: 3.45 MIL/uL — ABNORMAL LOW (ref 3.87–5.11)
RDW: 13.9 % (ref 11.5–15.5)
WBC: 5.2 10*3/uL (ref 4.0–10.5)
nRBC: 0 % (ref 0.0–0.2)

## 2023-05-03 LAB — CULTURE, BLOOD (ROUTINE X 2): Culture: NO GROWTH

## 2023-05-03 MED ORDER — PHENOL 1.4 % MT LIQD: 1.0000 | OROMUCOSAL | Status: DC | PRN

## 2023-05-03 MED ORDER — FUROSEMIDE 10 MG/ML IJ SOLN
40.0000 mg | Freq: Every day | INTRAMUSCULAR | Status: DC
  Administered 2023-05-03 – 2023-05-05 (×3): 40 mg via INTRAVENOUS
  Filled 2023-05-03 (×3): qty 4

## 2023-05-03 NOTE — Progress Notes (Signed)
Daily Progress Note   Patient Name: Debbie Arnold       Date: 05/03/2023 DOB: January 23, 1927  Age: 89 y.o. MRN#: [id number] Attending Physician: Briant Cedar, MD Primary Care Physician: Martyn Malay, MD Admit Date: 04/28/2023 Length of Stay: 5 days  Reason for Consultation/Follow-up: Establishing goals of care  Subjective:   CC: Patient laying in bed comfortably when seen today. Daughter, [name], present at bedside today. Following up regarding complex medical decision making.  Subjective:  Reviewed EMR prior to presenting to bedside.    Presented to bedside to check on patient.  Present at bedside was patient's daughter, [name].  Introduced myself as a member of the palliative medicine team.  Patient welcoming visit today.  Patient laying comfortably in bed enjoying time with her family.  Patient denies any concerns regarding symptom management today.  Patient is very grateful that she is continue to feel better every day.  Acknowledged this and expressed appreciation for this as well.  Also able to speak with [facility] regarding plans for care moving forward. [name] shared that Raiford Noble has gone home to rest for the day. When he notes that patient will be returning to her long-term care facility.  She noted that she and her brother [name] had discussed and with them working full time dogs, it is just not feasible to be a 24/7 caregiver for their mother.  Acknowledged this and appreciated that they continue to support their mother would love no matter where she is at.  Right knee noted that plan currently is for patient to return to long-term care facility on Tuesday.  [name] hopes that patient continues to improve and does not have another pneumonia event.  With permission, did discuss worry that with recurrent pneumonia, it is likely to happen again.  If it does not occur that is wonderful.  And if it does occur again, is important to continue discussions about quality time for patient moving  forward.  Discussed that if patient is able to come into the hospital and feel better and still get benefit from aggressive medical interventions, then would consider appropriate to do so.  If patient continues to get sicker despite aggressive medical interventions or spending more time in the hospital then outside of it not enjoying quality time, may need to address goals for medical care moving forward.  [name] acknowledged this.    All questions answered at that time.  Noted palliative medicine team would continue to follow along with patient's medical journey.  Thanked patient and her daughter for allowing me to visit today.  Objective:   Vital Signs:  BP (!) 160/72 (BP Location: Right Arm)   Pulse 69   Temp 99 F (37.2 C) (Oral)   Resp 18   Ht 5\' 5"  (1.651 m)   Wt 124.9 kg   SpO2 95%   BMI 45.82 kg/m   Physical Exam: General: NAD, comfortable laying in bed, pleasant Eyes: no drainage noted HENT: dry mucous membranes Cardiovascular: RRR Respiratory: no increased work of breathing noted, not in respiratory distress, on [place] O2 Neuro: awake, interactive Psych: pleasant  Imaging:  I personally reviewed recent imaging.   Assessment & Plan:   Assessment: Patient is a 89 year old female with a past medical history of anxiety, CHF, COPD, diabetes mellitus type 2, GERD, history of COVID-19, history of recurrent UTIs, OSA, and PE who was admitted on 04/28/2023 for management of worsening shortness of breath.  Patient transferred from [facility] facility in was not guardian for evaluation.  As per EMR review, patient had recently received oral azithromycin for recurrent pneumonia management.  Patient has noted history of recurrent pneumonias since 11/2022.  Since admission, imaging showed multifocal pneumonia causing patient's acute hypoxic respiratory failure.  Patient receiving O2 support and antibiotics for management.  Palliative medicine consulted to assist with complex medical decision  making.   Recommendations/Plan: # Complex medical decision making/goals of care:   -Patient resting comfortably in bed today without any noted symptom concerns.   -Poke with patient's daughter, [name], as described above in HPI.  Plan is for patient to return to her long-term care facility.  Hopeful patient can continue to enjoy lots of quality time moving forward.  With permission, did express worry about recurrent bonus.  Discussed importance of continuing conversation with care providers to make sure patient's time moving forward is focused on things that bring her joy and quality of life.                -  Code Status: DNR    # Symptom management                -Left shoulder pain, chronic   -Continue Tylenol 1000 mg BID  # Psycho-social/Spiritual Support:  - Patient has 3 living children. One of patient's daughters passed away a couple months ago.   # Discharge Planning: Returning to prior long-term care facility  Discussed with: patient, bedside RN, patient's daughter  Thank you for allowing the palliative care team to participate in the care Debbie Arnold.  Alvester Morin, DO Palliative Care Provider PMT # [phone]  If patient remains symptomatic despite maximum doses, please call PMT at [phone] between 0700 and 1900. Outside of these hours, please call attending, as PMT does not have night coverage.  This provider spent a total of 36 minutes providing patient's care.  Includes review of EMR, discussing care with other staff members involved in patient's medical care, obtaining relevant history and information from patient and/or patient's family, and personal review of imaging and lab work. Greater than 50% of the time was spent counseling and coordinating care related to the above assessment and plan.    *Please note that this is a verbal dictation therefore any spelling or grammatical errors are due to the "[employer]agon [employer] One" system interpretation.

## 2023-05-03 NOTE — Progress Notes (Signed)
PROGRESS NOTE  Debbie Arnold WUJ:[id number] DOB: 03/10/1927 DOA: 04/28/2023 PCP: Martyn Malay, MD  HPI/Recap of past 24 hours: Debbie Arnold is a 89 y.o. female with medical history significant of COPD, recurrent multifocal pneumonia, restrictive lung disease, IDDM, HFpEF, HTN, remote history of PE on anticoagulation, CKD stage IIIa, hypothyroidism, GERD, sent from nursing home for evaluation of worsening of hypoxia and mentation changes. History provided by son at bedside.  Son reported that the patient has been having breathing problems for about 1 month with productive cough yellowish sputum, wheezing and was treated for pneumonia last week with azithromycin with no significant improvement.  In the ED, patient was noted to be requiring about 15 L of O2 was subsequently placed on BiPAP due to hypoxic and hypercapnic respiratory failure.  Chest x-ray showed multifocal bilateral pneumonia.  Patient was admitted for further management.    Today, patient denies any new complaints.  Still noted intermittent cough, otherwise appears stable.    Assessment/Plan: Principal Problem:   PNA (pneumonia) Active Problems:   COPD (chronic obstructive pulmonary disease) (HCC)   Acute on chronic diastolic CHF (congestive heart failure) (HCC)   CHF (congestive heart failure) (HCC)   Multifocal pneumonia   Acute encephalopathy   Palliative care encounter   Acute respiratory failure with hypoxia (HCC)   Goals of care, counseling/discussion   DNR (do not resuscitate)   Need for emotional support   Counseling and coordination of care   Acute hypoxic and acute on chronic hypercapnic respiratory failure Possibly obesity hypoventilation syndrome Currently on about 2 L of O2 Likely multifactorial including multifocal/aspiration pneumonia, acute COPD exacerbation and possibly acute on chronic HFpEF decompensation Currently afebrile, with no leukocytosis CTA chest negative for PE but with reduced  sensitivity due to body habitus, motion artifact, multifocal airspace opacities in both lungs suspicious for multilobar pneumonia Urine strep pneumo, Legionella and both negative Continue Zosyn, doxycycline SLP consulted, appreciate recs with modified diet Aspiration precautions Supplemental O2, BiPAP as needed  Acute metabolic encephalopathy Improving Likely due to above Management as above   Possible acute COPD exacerbation Continue DuoNebs, Brovana, Pulmicort, as needed albuterol, S/p IV Solu-Medrol, switched to p.o. prednisone Incentive spirometry Supplemental oxygen, BiPAP as needed   Possible acute on chronic HFpEF decompensation BNP 279, falsely low in obese patients Last echo in 2023 showed EF of 55 to 60%, no regional wall motion abnormalities, grade 1 diastolic dysfunction Will give 1 dose of IV Lasix on 5/26 Repeat chest x-ray with bilateral interstitial pulmonary opacity, consistent with edema or infection.  No new air space opacity Daily weights, strict I's and O's  Hypokalemia Replace as needed  CKD stage IIIa Stable Daily BMP  Anemia of chronic kidney disease Hemoglobin stable Daily CBC   HTN Restart home Toprol, losartan at a lower dose Started amlodipine (not home med) Continue as needed labetalol   IDDM, with hyperglycemia Worsened by steroids SSI for now, hold off PO DM meds, semglee nightly  Hypothyroidism Continue Synthroid   Right upper extremity swelling Doppler negative for DVT   Morbid obesity Estimated body mass index is 45.82 kg/m as calculated from the following:   Height as of this encounter: 5\' 5"  (1.651 m).   Weight as of this encounter: 124.9 kg.   Goals of care discussion Poor prognosis, palliative/hospice on board Family planning for hospice    Code Status: DNR  Family Communication: Discussed with son and daughter at bedside  Disposition Plan: Status is: Inpatient Remains inpatient appropriate  because: Level of  care      Consultants: Palliative/hospice  Procedures: None  Antimicrobials: Zosyn Doxycycline  DVT prophylaxis: Heparin Tonica   Objective: Vitals:   05/02/23 2246 05/03/23 0337 05/03/23 0832 05/03/23 1036  BP: (!) 156/64 (!) 160/72  (!) 151/59  Pulse: 67 69  69  Resp: 16 18    Temp: 99.3 F (37.4 C) 99 F (37.2 C)    TempSrc: Oral Oral    SpO2: 97% 96% 95%   Weight:      Height:        Intake/Output Summary (Last 24 hours) at 05/03/2023 1656 Last data filed at 05/03/2023 1300 Gross per 24 hour  Intake 330.49 ml  Output 650 ml  Net -319.51 ml   Filed Weights   04/28/23 1600 05/01/23 0600  Weight: 126.3 kg 124.9 kg    Exam: General: NAD, more awake/alert Cardiovascular: S1, S2 present Respiratory: CTAB, bibasilar crackles Abdomen: Soft, nontender, nondistended, bowel sounds present Musculoskeletal: No bilateral pedal edema noted Skin: Normal Psychiatry: Normal mood     Data Reviewed: CBC: Recent Labs  Lab 04/28/23 1056 04/29/23 0331 04/30/23 0329 05/01/23 0311 05/02/23 0310 05/03/23 0418  WBC 5.6 5.7 4.6 4.1 5.7 5.2  NEUTROABS 4.1  --  3.2 2.6 2.7 2.8  HGB 12.0 10.6* 10.2* 10.3* 10.0* 10.4*  HCT 41.7 37.8 35.2* 36.6 35.3* 36.0  MCV 104.5* 108.3* 104.5* 105.2* 106.0* 104.3*  PLT 182 190 199 230 223 215   Basic Metabolic Panel: Recent Labs  Lab 04/29/23 0331 04/30/23 0329 05/01/23 0311 05/02/23 0310 05/03/23 0418  NA 138 137 136 137 138  K 4.0 3.7 3.5 3.1* 4.0  CL 90* 92* 92* 94* 96*  CO2 33* 33* 31 33* 32  GLUCOSE 217* 217* 255* 198* 177*  BUN 32* 46* 52* 52* 43*  CREATININE 1.44* 1.54* 1.34* 1.41* 1.08*  CALCIUM 8.5* 8.2* 8.3* 8.2* 8.5*   GFR: Estimated Creatinine Clearance: 43.3 mL/min (A) (by C-G formula based on SCr of 1.08 mg/dL (H)). Liver Function Tests: Recent Labs  Lab 04/28/23 1056  AST 16  ALT 13  ALKPHOS 28*  BILITOT 0.3  PROT 7.7  ALBUMIN 3.6   No results for input(s): "LIPASE", "AMYLASE" in the last 168  hours. No results for input(s): "AMMONIA" in the last 168 hours. Coagulation Profile: No results for input(s): "INR", "PROTIME" in the last 168 hours. Cardiac Enzymes: No results for input(s): "CKTOTAL", "CKMB", "CKMBINDEX", "TROPONINI" in the last 168 hours. BNP (last 3 results) No results for input(s): "PROBNP" in the last 8760 hours. HbA1C: No results for input(s): "HGBA1C" in the last 72 hours.  CBG: Recent Labs  Lab 05/02/23 1110 05/02/23 1610 05/02/23 2220 05/03/23 1009 05/03/23 1646  GLUCAP 205* 254* 243* 141* 200*   Lipid Profile: No results for input(s): "CHOL", "HDL", "LDLCALC", "TRIG", "CHOLHDL", "LDLDIRECT" in the last 72 hours. Thyroid Function Tests: No results for input(s): "TSH", "T4TOTAL", "FREET4", "T3FREE", "THYROIDAB" in the last 72 hours. Anemia Panel: No results for input(s): "VITAMINB12", "FOLATE", "FERRITIN", "TIBC", "IRON", "RETICCTPCT" in the last 72 hours. Urine analysis:    Component Value Date/Time   COLORURINE YELLOW 04/28/2023 1612   APPEARANCEUR CLOUDY (A) 04/28/2023 1612   LABSPEC 1.035 (H) 04/28/2023 1612   PHURINE 5.0 04/28/2023 1612   GLUCOSEU >=500 (A) 04/28/2023 1612   HGBUR MODERATE (A) 04/28/2023 1612   BILIRUBINUR NEGATIVE 04/28/2023 1612   KETONESUR NEGATIVE 04/28/2023 1612   PROTEINUR 30 (A) 04/28/2023 1612   NITRITE NEGATIVE 04/28/2023 1612  LEUKOCYTESUR LARGE (A) 04/28/2023 1612   Sepsis Labs: @LABRCNTIP (procalcitonin:4,lacticidven:4)  ) Recent Results (from the past 240 hour(s))  Blood Culture (routine x 2)     Status: None   Collection Time: 04/28/23 11:25 AM   Specimen: BLOOD  Result Value Ref Range Status   Specimen Description   Final    BLOOD BLOOD LEFT FOREARM Performed at [facility], [address], [place], [place] [address]    Special Requests   Final    Blood Culture results may not be optimal due to an inadequate volume of blood received in culture bottles BOTTLES DRAWN AEROBIC  ONLY Performed at [facility], [address], [place], [place] [address]    Culture   Final    NO GROWTH 5 DAYS Performed at [facility], [address] [place], [place] [address]    Report Status 05/03/2023 FINAL  Final  Blood Culture (routine x 2)     Status: None   Collection Time: 04/28/23 11:30 AM   Specimen: BLOOD  Result Value Ref Range Status   Specimen Description   Final    BLOOD RIGHT ANTECUBITAL Performed at [facility], [address], [place], [place] [address]    Special Requests   Final    BOTTLES DRAWN AEROBIC AND ANAEROBIC Blood Culture results may not be optimal due to an inadequate volume of blood received in culture bottles Performed at [facility], [address], [place], [place] [address]    Culture   Final    NO GROWTH 5 DAYS Performed at [facility], [address], [place], [place] [address]    Report Status 05/03/2023 FINAL  Final  MRSA Next Gen by PCR, Nasal     Status: None   Collection Time: 04/28/23  3:53 PM   Specimen: Nasal Mucosa; Nasal Swab  Result Value Ref Range Status   MRSA by PCR Next Gen NOT DETECTED NOT DETECTED Final    Comment: (NOTE) The GeneXpert MRSA Assay (FDA approved for NASAL specimens only), is one component of a comprehensive MRSA colonization surveillance program. It is not intended to diagnose MRSA infection nor to guide or monitor treatment for MRSA infections. Test performance is not FDA approved in patients less than 12 years old. Performed at [facility], [address] [place], [place] [address]   SARS Coronavirus 2 by RT PCR (hospital order, performed in [facility] hospital lab) *cepheid single result test* Urine, Unspecified Source     Status: None   Collection Time: 04/28/23  4:12 PM   Specimen: Urine, Unspecified Source; Nasal Swab  Result Value Ref Range Status   SARS Coronavirus 2 by RT PCR NEGATIVE NEGATIVE  Final    Comment: (NOTE) SARS-CoV-2 target nucleic acids are NOT DETECTED.  The SARS-CoV-2 RNA is generally detectable in upper and lower respiratory specimens during the acute phase of infection. The lowest concentration of SARS-CoV-2 viral copies this assay can detect is 250 copies / mL. A negative result does not preclude SARS-CoV-2 infection and should not be used as the sole basis for treatment or other patient management decisions.  A negative result may occur with improper specimen collection / handling, submission of specimen other than nasopharyngeal swab, presence of viral mutation(s) within the areas targeted by this assay, and inadequate number of viral copies (<250 copies / mL). A negative result must be combined with clinical observations, patient history, and epidemiological information.  Fact  Sheet for Patients:   RoadLapTop.co.za  Fact Sheet for Healthcare Providers: http://kim-miller.com/  This test is not yet approved or  cleared by the [place] FDA and has been authorized for detection and/or diagnosis of SARS-CoV-2 by FDA under an Emergency Use Authorization (EUA).  This EUA will remain in effect (meaning this test can be used) for the duration of the COVID-19 declaration under Section 564(b)(1) of the Act, 21 U.S.C. section 360bbb-3(b)(1), unless the authorization is terminated or revoked sooner.  Performed at [facility], [address] [place], [place] [address]   Urine Culture     Status: Abnormal   Collection Time: 04/28/23  4:12 PM   Specimen: Urine, Random  Result Value Ref Range Status   Specimen Description   Final    URINE, RANDOM Performed at [facility], [address] [place] [address], [place], [place] [address]    Special Requests   Final    NONE Reflexed from [id number] Performed at [facility], [address] [place], [place] [address]    Culture  (A)  Final    50,000 COLONIES/mL ESCHERICHIA COLI Confirmed Extended Spectrum Beta-Lactamase Producer (ESBL).  In bloodstream infections from ESBL organisms, carbapenems are preferred over piperacillin/tazobactam. They are shown to have a lower risk of mortality.    Report Status 04/30/2023 FINAL  Final   Organism ID, Bacteria ESCHERICHIA COLI (A)  Final      Susceptibility   Escherichia coli - MIC*    AMPICILLIN >=32 RESISTANT Resistant     CEFAZOLIN >=64 RESISTANT Resistant     CEFEPIME 16 RESISTANT Resistant     CIPROFLOXACIN >=4 RESISTANT Resistant     GENTAMICIN >=16 RESISTANT Resistant     IMIPENEM <=0.25 SENSITIVE Sensitive     NITROFURANTOIN <=16 SENSITIVE Sensitive     TRIMETH/SULFA >=320 RESISTANT Resistant     AMPICILLIN/SULBACTAM >=32 RESISTANT Resistant     PIP/TAZO <=4 SENSITIVE Sensitive     * 50,000 COLONIES/mL ESCHERICHIA COLI      Studies: No results found.  Scheduled Meds:  acetaminophen  1,000 mg Oral BID   amLODipine  10 mg Oral Daily   arformoterol  15 mcg Nebulization BID   aspirin  81 mg Oral Daily   budesonide  0.5 mg Nebulization BID   Chlorhexidine Gluconate Cloth  6 each Topical Daily   dorzolamide  1 drop Both Eyes BID   doxycycline  100 mg Oral Q12H   escitalopram  10 mg Oral Daily   fluticasone  2 spray Each Nare Daily   furosemide  40 mg Intravenous Daily   gabapentin  300 mg Oral BID   guaiFENesin  1,200 mg Oral BID   heparin  5,000 Units Subcutaneous Q12H   insulin aspart  0-15 Units Subcutaneous TID WC   insulin glargine-yfgn  10 Units Subcutaneous QHS   ipratropium-albuterol  3 mL Nebulization BID   levothyroxine  125 mcg Oral QAC breakfast   losartan  25 mg Oral Daily   metoprolol succinate  50 mg Oral Daily   pantoprazole  40 mg Oral Daily   polyethylene glycol  17 g Oral BID   predniSONE  40 mg Oral Q breakfast   senna  2 tablet Oral BID    Continuous Infusions:  piperacillin-tazobactam (ZOSYN)  IV 3.375 g (05/03/23 1048)      LOS: 5 days     Briant Cedar, MD [facility]  If 7PM-7AM, please contact night-coverage [email or web address] 05/03/2023, 4:56 PM

## 2023-05-03 NOTE — Progress Notes (Signed)
PT refused cpap, states per PT daughter she has trouble tolerating the mask and pressure, and does not use one at her facility.

## 2023-05-04 DIAGNOSIS — J189 Pneumonia, unspecified organism: Secondary | ICD-10-CM | POA: Diagnosis not present

## 2023-05-04 LAB — BASIC METABOLIC PANEL
Anion gap: 10 (ref 5–15)
BUN: 39 mg/dL — ABNORMAL HIGH (ref 8–23)
CO2: 31 mmol/L (ref 22–32)
Calcium: 8.5 mg/dL — ABNORMAL LOW (ref 8.9–10.3)
Chloride: 98 mmol/L (ref 98–111)
Creatinine, Ser: 1.07 mg/dL — ABNORMAL HIGH (ref 0.44–1.00)
GFR, Estimated: 48 mL/min — ABNORMAL LOW (ref >60)
Glucose, Bld: 181 mg/dL — ABNORMAL HIGH (ref 70–99)
Potassium: 3.8 mmol/L (ref 3.5–5.1)
Sodium: 139 mmol/L (ref 135–145)

## 2023-05-04 LAB — CBC WITH DIFFERENTIAL/PLATELET
Abs Immature Granulocytes: 0.06 10*3/uL (ref 0.00–0.07)
Basophils Absolute: 0 10*3/uL (ref 0.0–0.1)
Basophils Relative: 0 %
Eosinophils Absolute: 0 10*3/uL (ref 0.0–0.5)
Eosinophils Relative: 0 %
HCT: 35.2 % — ABNORMAL LOW (ref 36.0–46.0)
Hemoglobin: 10.3 g/dL — ABNORMAL LOW (ref 12.0–15.0)
Immature Granulocytes: 1 %
Lymphocytes Relative: 32 %
Lymphs Abs: 1.9 10*3/uL (ref 0.7–4.0)
MCH: 30.3 pg (ref 26.0–34.0)
MCHC: 29.3 g/dL — ABNORMAL LOW (ref 30.0–36.0)
MCV: 103.5 fL — ABNORMAL HIGH (ref 80.0–100.0)
Monocytes Absolute: 0.6 10*3/uL (ref 0.1–1.0)
Monocytes Relative: 9 %
Neutro Abs: 3.4 10*3/uL (ref 1.7–7.7)
Neutrophils Relative %: 58 %
Platelets: 236 10*3/uL (ref 150–400)
RBC: 3.4 MIL/uL — ABNORMAL LOW (ref 3.87–5.11)
RDW: 13.6 % (ref 11.5–15.5)
WBC: 6 10*3/uL (ref 4.0–10.5)
nRBC: 0 % (ref 0.0–0.2)

## 2023-05-04 LAB — GLUCOSE, CAPILLARY
Glucose-Capillary: 124 mg/dL — ABNORMAL HIGH (ref 70–99)
Glucose-Capillary: 184 mg/dL — ABNORMAL HIGH (ref 70–99)
Glucose-Capillary: 230 mg/dL — ABNORMAL HIGH (ref 70–99)
Glucose-Capillary: 240 mg/dL — ABNORMAL HIGH (ref 70–99)

## 2023-05-04 MED ORDER — LOSARTAN POTASSIUM 50 MG PO TABS
50.0000 mg | ORAL_TABLET | Freq: Every day | ORAL | Status: DC
  Administered 2023-05-04 – 2023-05-05 (×2): 50 mg via ORAL
  Filled 2023-05-04 (×3): qty 1

## 2023-05-04 MED ORDER — DM-GUAIFENESIN ER 30-600 MG PO TB12
1.0000 | ORAL_TABLET | Freq: Two times a day (BID) | ORAL | Status: DC
  Administered 2023-05-04 – 2023-05-05 (×3): 1 via ORAL
  Filled 2023-05-04 (×3): qty 1

## 2023-05-04 NOTE — Progress Notes (Signed)
  Daily Progress Note   Patient Name: Debbie Arnold       Date: 05/04/2023 DOB: 04/26/1927  Age: 89 y.o. MRN#: [id number] Attending Physician: Briant Cedar, MD Primary Care Physician: Martyn Malay, MD Admit Date: 04/28/2023 Length of Stay: 6 days  Plan is for patient to return to her long term care facility once medical appropraite and facility can accept. Have discussed care with patient's son and daughter during hospitalization. Palliative medicine team will continue to follow along peripherally with patient's care.If acte PMT team needs arise, please reach out. Thank you.   Alvester Morin, DO Palliative Care Provider PMT # [phone]

## 2023-05-04 NOTE — Progress Notes (Signed)
PROGRESS NOTE  Debbie Arnold WUJ:[id number] DOB: 1927/04/24 DOA: 04/28/2023 PCP: Martyn Malay, MD  HPI/Recap of past 24 hours: Debbie Arnold is a 89 y.o. female with medical history significant of COPD, recurrent multifocal pneumonia, restrictive lung disease, IDDM, HFpEF, HTN, remote history of PE on anticoagulation, CKD stage IIIa, hypothyroidism, GERD, sent from nursing home for evaluation of worsening of hypoxia and mentation changes. History provided by son at bedside.  Son reported that the patient has been having breathing problems for about 1 month with productive cough yellowish sputum, wheezing and was treated for pneumonia last week with azithromycin with no significant improvement.  In the ED, patient was noted to be requiring about 15 L of O2 was subsequently placed on BiPAP due to hypoxic and hypercapnic respiratory failure.  Chest x-ray showed multifocal bilateral pneumonia.  Patient was admitted for further management.    Today, patient denies any new complaints.  Just reports chronic cough has been ongoing but reports is slowly improving.    Assessment/Plan: Principal Problem:   PNA (pneumonia) Active Problems:   COPD (chronic obstructive pulmonary disease) (HCC)   Acute on chronic diastolic CHF (congestive heart failure) (HCC)   CHF (congestive heart failure) (HCC)   Multifocal pneumonia   Acute encephalopathy   Palliative care encounter   Acute respiratory failure with hypoxia (HCC)   Goals of care, counseling/discussion   DNR (do not resuscitate)   Need for emotional support   Counseling and coordination of care   Acute hypoxic and acute on chronic hypercapnic respiratory failure Possibly obesity hypoventilation syndrome Currently on about 2 L of O2 Likely multifactorial including multifocal/aspiration pneumonia, acute COPD exacerbation and possibly acute on chronic HFpEF decompensation Currently afebrile, with no leukocytosis CTA chest negative for PE but  with reduced sensitivity due to body habitus, motion artifact, multifocal airspace opacities in both lungs suspicious for multilobar pneumonia Urine strep pneumo, Legionella and both negative Continue Zosyn, doxycycline SLP consulted, appreciate recs with modified diet Aspiration precautions Supplemental O2, BiPAP as needed  Acute metabolic encephalopathy Improving Likely due to above Management as above   Possible acute COPD exacerbation Continue DuoNebs, Brovana, Pulmicort, as needed albuterol, S/p IV Solu-Medrol, switched to p.o. prednisone Incentive spirometry Supplemental oxygen, BiPAP as needed   Possible acute on chronic HFpEF decompensation BNP 279, falsely low in obese patients Last echo in 2023 showed EF of 55 to 60%, no regional wall motion abnormalities, grade 1 diastolic dysfunction Daily IV Lasix  Repeat chest x-ray with bilateral interstitial pulmonary opacity, consistent with edema or infection.  No new air space opacity Daily weights, strict I's and O's  Hypokalemia Replace as needed  CKD stage IIIa Stable Daily BMP  Anemia of chronic kidney disease Hemoglobin stable Daily CBC   HTN Restart home Toprol, losartan at a lower dose Started amlodipine (not home med) Continue as needed labetalol   IDDM, with hyperglycemia Worsened by steroids SSI for now, hold off PO DM meds, semglee nightly  Hypothyroidism Continue Synthroid   Right upper extremity swelling Doppler negative for DVT   Morbid obesity Estimated body mass index is 45.82 kg/m as calculated from the following:   Height as of this encounter: 5\' 5"  (1.651 m).   Weight as of this encounter: 124.9 kg.   Goals of care discussion Poor prognosis, palliative/hospice on board Family planning for patient to go back to long-term care facility ([facility])    Code Status: DNR  Family Communication: Discussed with daughter at bedside  Disposition Plan:  Status is: Inpatient Remains inpatient  appropriate because: Level of care      Consultants: Palliative/hospice  Procedures: None  Antimicrobials: Zosyn Doxycycline  DVT prophylaxis: Heparin [place]   Objective: Vitals:   05/03/23 2221 05/04/23 0624 05/04/23 0914 05/04/23 1200  BP: (!) 156/65 (!) 168/66 (!) 173/59 (!) 159/67  Pulse: 70 68 64 65  Resp: 18 18  17   Temp: 99 F (37.2 C) 98.7 F (37.1 C)  98.7 F (37.1 C)  TempSrc: Oral Oral  Oral  SpO2: 93% 94%  94%  Weight:      Height:        Intake/Output Summary (Last 24 hours) at 05/04/2023 1610 Last data filed at 05/04/2023 1512 Gross per 24 hour  Intake 630 ml  Output 1600 ml  Net -970 ml   Filed Weights   04/28/23 1600 05/01/23 0600  Weight: 126.3 kg 124.9 kg    Exam: General: NAD, more awake/alert Cardiovascular: S1, S2 present Respiratory: CTAB Abdomen: Soft, nontender, nondistended, bowel sounds present Musculoskeletal: No bilateral pedal edema noted Skin: Normal Psychiatry: Normal mood     Data Reviewed: CBC: Recent Labs  Lab 04/30/23 0329 05/01/23 0311 05/02/23 0310 05/03/23 0418 05/04/23 0347  WBC 4.6 4.1 5.7 5.2 6.0  NEUTROABS 3.2 2.6 2.7 2.8 3.4  HGB 10.2* 10.3* 10.0* 10.4* 10.3*  HCT 35.2* 36.6 35.3* 36.0 35.2*  MCV 104.5* 105.2* 106.0* 104.3* 103.5*  PLT 199 230 223 215 236   Basic Metabolic Panel: Recent Labs  Lab 04/30/23 0329 05/01/23 0311 05/02/23 0310 05/03/23 0418 05/04/23 0347  NA 137 136 137 138 139  K 3.7 3.5 3.1* 4.0 3.8  CL 92* 92* 94* 96* 98  CO2 33* 31 33* 32 31  GLUCOSE 217* 255* 198* 177* 181*  BUN 46* 52* 52* 43* 39*  CREATININE 1.54* 1.34* 1.41* 1.08* 1.07*  CALCIUM 8.2* 8.3* 8.2* 8.5* 8.5*   GFR: Estimated Creatinine Clearance: 43.7 mL/min (A) (by C-G formula based on SCr of 1.07 mg/dL (H)). Liver Function Tests: Recent Labs  Lab 04/28/23 1056  AST 16  ALT 13  ALKPHOS 28*  BILITOT 0.3  PROT 7.7  ALBUMIN 3.6   No results for input(s): "LIPASE", "AMYLASE" in the last 168  hours. No results for input(s): "AMMONIA" in the last 168 hours. Coagulation Profile: No results for input(s): "INR", "PROTIME" in the last 168 hours. Cardiac Enzymes: No results for input(s): "CKTOTAL", "CKMB", "CKMBINDEX", "TROPONINI" in the last 168 hours. BNP (last 3 results) No results for input(s): "PROBNP" in the last 8760 hours. HbA1C: No results for input(s): "HGBA1C" in the last 72 hours.  CBG: Recent Labs  Lab 05/03/23 1009 05/03/23 1646 05/03/23 2222 05/04/23 0754 05/04/23 1158  GLUCAP 141* 200* 265* 124* 184*   Lipid Profile: No results for input(s): "CHOL", "HDL", "LDLCALC", "TRIG", "CHOLHDL", "LDLDIRECT" in the last 72 hours. Thyroid Function Tests: No results for input(s): "TSH", "T4TOTAL", "FREET4", "T3FREE", "THYROIDAB" in the last 72 hours. Anemia Panel: No results for input(s): "VITAMINB12", "FOLATE", "FERRITIN", "TIBC", "IRON", "RETICCTPCT" in the last 72 hours. Urine analysis:    Component Value Date/Time   COLORURINE YELLOW 04/28/2023 1612   APPEARANCEUR CLOUDY (A) 04/28/2023 1612   LABSPEC 1.035 (H) 04/28/2023 1612   PHURINE 5.0 04/28/2023 1612   GLUCOSEU >=500 (A) 04/28/2023 1612   HGBUR MODERATE (A) 04/28/2023 1612   BILIRUBINUR NEGATIVE 04/28/2023 1612   KETONESUR NEGATIVE 04/28/2023 1612   PROTEINUR 30 (A) 04/28/2023 1612   NITRITE NEGATIVE 04/28/2023 1612   LEUKOCYTESUR LARGE (  A) 04/28/2023 1612   Sepsis Labs: @LABRCNTIP (procalcitonin:4,lacticidven:4)  ) Recent Results (from the past 240 hour(s))  Blood Culture (routine x 2)     Status: None   Collection Time: 04/28/23 11:25 AM   Specimen: BLOOD  Result Value Ref Range Status   Specimen Description   Final    BLOOD BLOOD LEFT FOREARM Performed at [facility], [address], [place], [place] [address]    Special Requests   Final    Blood Culture results may not be optimal due to an inadequate volume of blood received in culture bottles BOTTLES DRAWN AEROBIC  ONLY Performed at [facility], [address] [place], [place] [address]    Culture   Final    NO GROWTH 5 DAYS Performed at [facility], [address], [place], [place] [address]    Report Status 05/03/2023 FINAL  Final  Blood Culture (routine x 2)     Status: None   Collection Time: 04/28/23 11:30 AM   Specimen: BLOOD  Result Value Ref Range Status   Specimen Description   Final    BLOOD RIGHT ANTECUBITAL Performed at [facility], [address], [place], [place] [address]    Special Requests   Final    BOTTLES DRAWN AEROBIC AND ANAEROBIC Blood Culture results may not be optimal due to an inadequate volume of blood received in culture bottles Performed at [facility], [address] [place], [place] [address]    Culture   Final    NO GROWTH 5 DAYS Performed at [facility], [address], [place], [place] [address]    Report Status 05/03/2023 FINAL  Final  MRSA Next Gen by PCR, Nasal     Status: None   Collection Time: 04/28/23  3:53 PM   Specimen: Nasal Mucosa; Nasal Swab  Result Value Ref Range Status   MRSA by PCR Next Gen NOT DETECTED NOT DETECTED Final    Comment: (NOTE) The GeneXpert MRSA Assay (FDA approved for NASAL specimens only), is one component of a comprehensive MRSA colonization surveillance program. It is not intended to diagnose MRSA infection nor to guide or monitor treatment for MRSA infections. Test performance is not FDA approved in patients less than 61 years old. Performed at [facility], [address] [place], [place] [address]   SARS Coronavirus 2 by RT PCR (hospital order, performed in [facility] hospital lab) *cepheid single result test* Urine, Unspecified Source     Status: None   Collection Time: 04/28/23  4:12 PM   Specimen: Urine, Unspecified Source; Nasal Swab  Result Value Ref Range Status   SARS Coronavirus 2 by RT PCR NEGATIVE NEGATIVE  Final    Comment: (NOTE) SARS-CoV-2 target nucleic acids are NOT DETECTED.  The SARS-CoV-2 RNA is generally detectable in upper and lower respiratory specimens during the acute phase of infection. The lowest concentration of SARS-CoV-2 viral copies this assay can detect is 250 copies / mL. A negative result does not preclude SARS-CoV-2 infection and should not be used as the sole basis for treatment or other patient management decisions.  A negative result may occur with improper specimen collection / handling, submission of specimen other than nasopharyngeal swab, presence of viral mutation(s) within the areas targeted by this assay, and inadequate number of viral copies (<250 copies / mL). A negative result must be combined with clinical observations, patient history, and epidemiological information.  Fact Sheet for  Patients:   RoadLapTop.co.za  Fact Sheet for Healthcare Providers: http://kim-miller.com/  This test is not yet approved or  cleared by the [place] FDA and has been authorized for detection and/or diagnosis of SARS-CoV-2 by FDA under an Emergency Use Authorization (EUA).  This EUA will remain in effect (meaning this test can be used) for the duration of the COVID-19 declaration under Section 564(b)(1) of the Act, 21 U.S.C. section 360bbb-3(b)(1), unless the authorization is terminated or revoked sooner.  Performed at [facility], [address], [place], [place] [address]   Urine Culture     Status: Abnormal   Collection Time: 04/28/23  4:12 PM   Specimen: Urine, Random  Result Value Ref Range Status   Specimen Description   Final    URINE, RANDOM Performed at [facility], [address], [place], [place] [address]    Special Requests   Final    NONE Reflexed from [id number] Performed at [facility], [address], [place], [place] [address]    Culture  (A)  Final    50,000 COLONIES/mL ESCHERICHIA COLI Confirmed Extended Spectrum Beta-Lactamase Producer (ESBL).  In bloodstream infections from ESBL organisms, carbapenems are preferred over piperacillin/tazobactam. They are shown to have a lower risk of mortality.    Report Status 04/30/2023 FINAL  Final   Organism ID, Bacteria ESCHERICHIA COLI (A)  Final      Susceptibility   Escherichia coli - MIC*    AMPICILLIN >=32 RESISTANT Resistant     CEFAZOLIN >=64 RESISTANT Resistant     CEFEPIME 16 RESISTANT Resistant     CIPROFLOXACIN >=4 RESISTANT Resistant     GENTAMICIN >=16 RESISTANT Resistant     IMIPENEM <=0.25 SENSITIVE Sensitive     NITROFURANTOIN <=16 SENSITIVE Sensitive     TRIMETH/SULFA >=320 RESISTANT Resistant     AMPICILLIN/SULBACTAM >=32 RESISTANT Resistant     PIP/TAZO <=4 SENSITIVE Sensitive     * 50,000 COLONIES/mL ESCHERICHIA COLI      Studies: No results found.  Scheduled Meds:  acetaminophen  1,000 mg Oral BID   amLODipine  10 mg Oral Daily   arformoterol  15 mcg Nebulization BID   aspirin  81 mg Oral Daily   budesonide  0.5 mg Nebulization BID   Chlorhexidine Gluconate Cloth  6 each Topical Daily   dextromethorphan-guaiFENesin  1 tablet Oral BID   dorzolamide  1 drop Both Eyes BID   doxycycline  100 mg Oral Q12H   escitalopram  10 mg Oral Daily   fluticasone  2 spray Each Nare Daily   furosemide  40 mg Intravenous Daily   gabapentin  300 mg Oral BID   heparin  5,000 Units Subcutaneous Q12H   insulin aspart  0-15 Units Subcutaneous TID WC   insulin glargine-yfgn  10 Units Subcutaneous QHS   ipratropium-albuterol  3 mL Nebulization BID   levothyroxine  125 mcg Oral QAC breakfast   losartan  50 mg Oral Daily   metoprolol succinate  50 mg Oral Daily   pantoprazole  40 mg Oral Daily   polyethylene glycol  17 g Oral BID   predniSONE  40 mg Oral Q breakfast   senna  2 tablet Oral BID    Continuous Infusions:  piperacillin-tazobactam (ZOSYN)  IV 3.375 g  (05/04/23 0910)     LOS: 6 days     Briant Cedar, MD [facility]  If 7PM-7AM, please contact night-coverage [email or web address] 05/04/2023, 4:10 PM

## 2023-05-04 NOTE — Progress Notes (Signed)
Patient does not want to wear cpap tonight ?

## 2023-05-04 NOTE — Care Management Important Message (Signed)
Important Message  Patient Details IM Letter given. Name: HAIZEL CASSEY MRN: [id number] Date of Birth: 1927-06-24   Medicare Important Message Given:  Yes     Caren Macadam 05/04/2023, 2:41 PM

## 2023-05-05 DIAGNOSIS — Z515 Encounter for palliative care: Secondary | ICD-10-CM

## 2023-05-05 DIAGNOSIS — J189 Pneumonia, unspecified organism: Secondary | ICD-10-CM | POA: Diagnosis not present

## 2023-05-05 LAB — CBC WITH DIFFERENTIAL/PLATELET
Abs Immature Granulocytes: 0.08 10*3/uL — ABNORMAL HIGH (ref 0.00–0.07)
Basophils Absolute: 0 10*3/uL (ref 0.0–0.1)
Basophils Relative: 0 %
Eosinophils Absolute: 0 10*3/uL (ref 0.0–0.5)
Eosinophils Relative: 0 %
HCT: 35 % — ABNORMAL LOW (ref 36.0–46.0)
Hemoglobin: 10.4 g/dL — ABNORMAL LOW (ref 12.0–15.0)
Immature Granulocytes: 1 %
Lymphocytes Relative: 33 %
Lymphs Abs: 2.4 10*3/uL (ref 0.7–4.0)
MCH: 29.9 pg (ref 26.0–34.0)
MCHC: 29.7 g/dL — ABNORMAL LOW (ref 30.0–36.0)
MCV: 100.6 fL — ABNORMAL HIGH (ref 80.0–100.0)
Monocytes Absolute: 0.6 10*3/uL (ref 0.1–1.0)
Monocytes Relative: 8 %
Neutro Abs: 4 10*3/uL (ref 1.7–7.7)
Neutrophils Relative %: 58 %
Platelets: 232 10*3/uL (ref 150–400)
RBC: 3.48 MIL/uL — ABNORMAL LOW (ref 3.87–5.11)
RDW: 13.6 % (ref 11.5–15.5)
WBC: 7.1 10*3/uL (ref 4.0–10.5)
nRBC: 0.3 % — ABNORMAL HIGH (ref 0.0–0.2)

## 2023-05-05 LAB — GLUCOSE, CAPILLARY
Glucose-Capillary: 134 mg/dL — ABNORMAL HIGH (ref 70–99)
Glucose-Capillary: 179 mg/dL — ABNORMAL HIGH (ref 70–99)
Glucose-Capillary: 333 mg/dL — ABNORMAL HIGH (ref 70–99)

## 2023-05-05 MED ORDER — FUROSEMIDE 40 MG PO TABS: 60.0000 mg | ORAL_TABLET | Freq: Every day | ORAL | 0 refills | Status: AC

## 2023-05-05 MED ORDER — DM-GUAIFENESIN ER 30-600 MG PO TB12: 1.0000 | ORAL_TABLET | Freq: Two times a day (BID) | ORAL | Status: AC

## 2023-05-05 MED ORDER — PREDNISONE 10 MG PO TABS: ORAL_TABLET | ORAL | 0 refills | Status: AC

## 2023-05-05 MED ORDER — ARFORMOTEROL TARTRATE 15 MCG/2ML IN NEBU: 15.0000 ug | INHALATION_SOLUTION | Freq: Two times a day (BID) | RESPIRATORY_TRACT | Status: AC

## 2023-05-05 NOTE — TOC Transition Note (Signed)
Transition of Care [facility]) - CM/SW Discharge Note   Patient Details  Name: Debbie Arnold MRN: [id number] Date of Birth: [birth year]  Transition of Care [facility]) CM/SW Contact:  Howell Rucks, RN Phone Number: 05/05/2023, 11:41 AM   Clinical Narrative:  Plan for discharge to [facility]. NCM outreached to [facility], sw Willette, confirmed bed available for return today, B, RM 607B, Call report: [phone]. PTAR for transportation.      Final next level of care: Other (comment) ([facility]) Barriers to Discharge: Continued Medical Work up   Patient Goals and CMS Choice CMS Medicare.gov Compare Post Acute Care list provided to:: Patient Represenative (must comment) ([name] (dtr)) Choice offered to / list presented to : Adult Children  Discharge Placement                         Discharge Plan and Services Additional resources added to the After Visit Summary for     Discharge Planning Services: CM Consult Post Acute Care Choice:  (TBD)                               Social Determinants of Health (SDOH) Interventions SDOH Screenings   Food Insecurity: No Food Insecurity (04/28/2023)  Housing: Low Risk  (04/28/2023)  Transportation Needs: No Transportation Needs (04/28/2023)  Utilities: Not At Risk (04/28/2023)  Tobacco Use: Low Risk  (04/28/2023)     Readmission Risk Interventions    04/29/2023   10:04 AM  Readmission Risk Prevention Plan  Transportation Screening Complete  PCP or Specialist Appt within 3-5 Days Complete  HRI or Home Care Consult Complete  Social Work Consult for Recovery Care Planning/Counseling Complete  Palliative Care Screening Complete  Medication Review Ocean[employer]) Complete

## 2023-05-05 NOTE — Progress Notes (Signed)
PMT no charge note Chart reviewed Note plans for discharge today No new PMT specific interventions at this time Recommend palliative consultation at long-term care facility Rosalin Hawking, MD [place] [facility] No charge

## 2023-05-05 NOTE — Discharge Summary (Signed)
Physician Discharge Summary   Patient: Debbie Arnold MRN: [id number] DOB: December 18, 1926  Admit date:     04/28/2023  Discharge date: 05/05/23  Discharge Physician: Briant Cedar   PCP: Martyn Malay, MD   Recommendations at discharge:   Follow-up with PCP in 1 week  Discharge Diagnoses: Principal Problem:   PNA (pneumonia) Active Problems:   COPD (chronic obstructive pulmonary disease) (HCC)   Acute on chronic diastolic CHF (congestive heart failure) (HCC)   CHF (congestive heart failure) (HCC)   Multifocal pneumonia   Acute encephalopathy   Palliative care encounter   Acute respiratory failure with hypoxia (HCC)   Goals of care, counseling/discussion   DNR (do not resuscitate)   Need for emotional support   Counseling and coordination of care    Hospital Course: Debbie Arnold is a 89 y.o. female with medical history significant of COPD, recurrent multifocal pneumonia, restrictive lung disease, IDDM, HFpEF, HTN, remote history of PE on anticoagulation, CKD stage IIIa, hypothyroidism, GERD, sent from nursing home for evaluation of worsening of hypoxia and mentation changes. History provided by son at bedside.  Son reported that the patient has been having breathing problems for about 1 month with productive cough yellowish sputum, wheezing and was treated for pneumonia last week with azithromycin with no significant improvement.  In the ED, patient was noted to be requiring about 15 L of O2 was subsequently placed on BiPAP due to hypoxic and hypercapnic respiratory failure.  Chest x-ray showed multifocal bilateral pneumonia.  Patient was admitted for further management.     Today, patient denies any new complaints.  Patient stable to be transferred to long-term care   Assessment and Plan:  Acute hypoxic and acute on chronic hypercapnic respiratory failure Possibly obesity hypoventilation syndrome Multifocal PNA Currently on about 2 L of O2 Likely multifactorial  including multifocal/aspiration pneumonia, acute COPD exacerbation and possibly acute on chronic HFpEF decompensation Currently afebrile, with no leukocytosis CTA chest negative for PE but with reduced sensitivity due to body habitus, motion artifact, multifocal airspace opacities in both lungs suspicious for multilobar pneumonia Urine strep pneumo, Legionella and both negative Completed Zosyn, doxycycline x 7 days SLP consulted, appreciate recs with modified diet Aspiration precautions-always placed upright when eating/drinking/taking medications Supplemental O2 prn   Acute metabolic encephalopathy Resolved   Acute COPD exacerbation Continue DuoNebs, Brovana, Pulmicort, as needed albuterol, S/p IV Solu-Medrol, switched to tapered p.o. prednisone over 6 days Incentive spirometry Supplemental oxygen prn   Possible acute on chronic HFpEF decompensation BNP 279, falsely low in obese patients Last echo in 2023 showed EF of 55 to 60%, no regional wall motion abnormalities, grade 1 diastolic dysfunction S/p IV Lasix--> PO lasix 60 mg daily, adjust pending fluid status  Hypokalemia Replaced as needed   CKD stage IIIa Stable   Anemia of chronic kidney disease Hemoglobin stable   HTN Continue home Toprol, losartan    IDDM, with hyperglycemia Worsened by steroids Continue home regimen   Hypothyroidism Continue Synthroid    Right upper extremity swelling Doppler negative for DVT    Morbid obesity Estimated body mass index is 45.82 kg/m as calculated from the following:   Height as of this encounter: 5\' 5"  (1.651 m).   Weight as of this encounter: 124.9 kg.    Goals of care discussion Poor prognosis, palliative/hospice Family planning for patient to go back to long-term care facility ([facility])       Consultants: Palliative/hospice Procedures performed: None Disposition: Long term care facility Diet  recommendation:  Discharge Diet Orders (From admission, onward)       Dysphagia type 3 thin Liquid   DISCHARGE MEDICATION: Allergies as of 05/05/2023       Reactions   Brimonidine Itching   Not listed on med list from [facility]   Celecoxib Other (See Comments)   Red Dye Other (See Comments)   Not listed on med list from [facility]   Codeine Anxiety        Medication List     STOP taking these medications    azithromycin 250 MG tablet Commonly known as: ZITHROMAX   cyclobenzaprine 5 MG tablet Commonly known as: FLEXERIL   guaiFENesin 600 MG 12 hr tablet Commonly known as: MUCINEX       TAKE these medications    acetaminophen 500 MG tablet Commonly known as: TYLENOL Take 1,000 mg by mouth 3 (three) times daily.   albuterol 108 (90 Base) MCG/ACT inhaler Commonly known as: VENTOLIN HFA Inhale 2 puffs into the lungs every 4 (four) hours as needed for wheezing or shortness of breath.   Anoro Ellipta 62.5-25 MCG/ACT Aepb Generic drug: umeclidinium-vilanterol Inhale 1 puff into the lungs daily.   arformoterol 15 MCG/2ML Nebu Commonly known as: BROVANA Take 2 mLs (15 mcg total) by nebulization 2 (two) times daily.   aspirin 81 MG chewable tablet Take 81 mg by mouth daily.   budesonide 0.5 MG/2ML nebulizer solution Commonly known as: PULMICORT Take 0.5 mg by nebulization 2 (two) times daily.   dextromethorphan-guaiFENesin 30-600 MG 12hr tablet Commonly known as: MUCINEX DM Take 1 tablet by mouth 2 (two) times daily.   diclofenac Sodium 1 % Gel Commonly known as: VOLTAREN Apply 2-4 g topically in the morning and at bedtime. Left shoulder/arm   dorzolamide 2 % ophthalmic solution Commonly known as: TRUSOPT Place 1 drop into both eyes 3 (three) times daily.   doxazosin 1 MG tablet Commonly known as: CARDURA Take 1 mg by mouth daily.   empagliflozin 10 MG Tabs tablet Commonly known as: JARDIANCE Take 1 tablet (10 mg total) by mouth daily.   escitalopram 10 MG tablet Commonly known as: LEXAPRO Take  10 mg by mouth daily.   fluticasone 50 MCG/ACT nasal spray Commonly known as: FLONASE Place 2 sprays into both nostrils in the morning and at bedtime.   folic acid 1 MG tablet Commonly known as: FOLVITE Take 5 mg by mouth daily.   furosemide 40 MG tablet Commonly known as: LASIX Take 1.5 tablets (60 mg total) by mouth daily.   gabapentin 100 MG capsule Commonly known as: NEURONTIN Take 300 mg by mouth 2 (two) times daily.   levothyroxine 125 MCG tablet Commonly known as: SYNTHROID Take 125 mcg by mouth daily before breakfast.   losartan 100 MG tablet Commonly known as: COZAAR Take 100 mg by mouth daily.   metoprolol succinate 50 MG 24 hr tablet Commonly known as: TOPROL-XL Take 1 tablet (50 mg total) by mouth daily. Take with or immediately following a meal.   omeprazole 40 MG capsule Commonly known as: PRILOSEC Take 40 mg by mouth daily.   polyethylene glycol powder 17 GM/SCOOP powder Commonly known as: GLYCOLAX/MIRALAX Take 1 Container by mouth See admin instructions. 1 scoop bid   predniSONE 10 MG tablet Commonly known as: DELTASONE Take 3 tablets (30 mg total) by mouth daily with breakfast for 2 days, THEN 2 tablets (20 mg total) daily with breakfast for 2 days, THEN 1 tablet (10 mg total) daily with breakfast  for 2 days. Start taking on: May 06, 2023   psyllium 0.52 g capsule Commonly known as: REGULOID Take 0.52 g by mouth at bedtime.   SALONPAS PAIN RELIEF PATCH EX Apply 1 patch topically See admin instructions. Apply to left shoulder every 12 hours as needed for pain. Leave on for 12 hours, then off for 12 hours   senna 8.6 MG tablet Commonly known as: SENOKOT Take 2 tablets by mouth 2 (two) times daily.        Follow-up Information     Martyn Malay, MD. Schedule an appointment as soon as possible for a visit in 1 week(s).   Specialty: Internal Medicine Contact information: [address] [place]  [address] [phone]                Discharge Exam: Ceasar Mons Weights   04/28/23 1600 05/01/23 0600  Weight: 126.3 kg 124.9 kg   General: NAD  Cardiovascular: S1, S2 present Respiratory: CTAB Abdomen: Soft, nontender, nondistended, bowel sounds present Musculoskeletal: No bilateral pedal edema noted Skin: Normal Psychiatry: Normal mood   Condition at discharge: stable  The results of significant diagnostics from this hospitalization (including imaging, microbiology, ancillary and laboratory) are listed below for reference.   Imaging Studies: VAS [place] UPPER EXTREMITY VENOUS DUPLEX  Result Date: 05/01/2023 UPPER VENOUS STUDY  Patient Name:  HOSANNA MOULIN  Date of Exam:   05/01/2023 Medical Rec #: [id number]      Accession #:    [id number] Date of Birth: [birth year]      Patient Gender: F Patient Age:   15 years Exam Location:  [facility] Procedure:      VAS [place] UPPER EXTREMITY VENOUS DUPLEX Referring Phys: [facility] Avonne Berkery --------------------------------------------------------------------------------  Indications: Swelling Other Indications: Recent RUE IV placement. Limitations: Body habitus and poor ultrasound/tissue interface. Comparison Study: No previous exams Performing Technologist: Jody Hill RVT, RDMS  Examination Guidelines: A complete evaluation includes B-mode imaging, spectral Doppler, color Doppler, and power Doppler as needed of all accessible portions of each vessel. Bilateral testing is considered an integral part of a complete examination. Limited examinations for reoccurring indications may be performed as noted.  Right Findings: +----------+------------+---------+-----------+----------+---------------------+ RIGHT     CompressiblePhasicitySpontaneousProperties       Summary        +----------+------------+---------+-----------+----------+---------------------+ IJV           Full       No        Yes                                     +----------+------------+---------+-----------+----------+---------------------+ Subclavian               Yes       Yes                    patent by                                                               color/doppler     +----------+------------+---------+-----------+----------+---------------------+ Axillary      Full       Yes       Yes                                    +----------+------------+---------+-----------+----------+---------------------+  Brachial      Full       Yes       Yes                                    +----------+------------+---------+-----------+----------+---------------------+ Radial        Full                                                        +----------+------------+---------+-----------+----------+---------------------+ Ulnar         Full                                                        +----------+------------+---------+-----------+----------+---------------------+ Cephalic      Full                                   only visualized in                                                            the forearm      +----------+------------+---------+-----------+----------+---------------------+ Basilic       Full       Yes       Yes                                    +----------+------------+---------+-----------+----------+---------------------+  Left Findings: +----------+------------+---------+-----------+----------+--------------------+ LEFT      CompressiblePhasicitySpontaneousProperties      Summary        +----------+------------+---------+-----------+----------+--------------------+ Subclavian               Yes       Yes                   patent by                                                              color/doppler     +----------+------------+---------+-----------+----------+--------------------+  Summary:  Right: No evidence of deep vein thrombosis in the upper  extremity. No evidence of superficial vein thrombosis in the upper extremity.  Left: No evidence of thrombosis in the subclavian.  *See table(s) above for measurements and observations.  Diagnosing physician: Waverly Ferrari MD Electronically signed by Waverly Ferrari MD on 05/01/2023 at 7:01:30 PM.    Final    DG CHEST PORT 1 VIEW  Result Date: 04/30/2023 CLINICAL DATA:  Dyspnea, shortness of breath, pneumonia EXAM: PORTABLE CHEST 1 VIEW COMPARISON:  04/28/2023 FINDINGS: No significant change in AP portable chest radiograph. Cardiomegaly with diffuse bilateral interstitial pulmonary opacity. Bandlike  scarring or atelectasis of the mid lungs. No new airspace opacity. IMPRESSION: No significant change in AP portable chest radiograph. Cardiomegaly with diffuse bilateral interstitial pulmonary opacity, consistent with edema or infection. No new airspace opacity. Electronically Signed   By: Jearld Lesch M.D.   On: 04/30/2023 18:10   CT Angio Chest PE W and/or Wo Contrast  Result Date: 04/28/2023 CLINICAL DATA:  Nonproductive cough. Intermittent pneumonia. High clinical suspicion of pulmonary embolus. EXAM: CT ANGIOGRAPHY CHEST WITH CONTRAST TECHNIQUE: Multidetector CT imaging of the chest was performed using the standard protocol during bolus administration of intravenous contrast. Multiplanar CT image reconstructions and MIPs were obtained to evaluate the vascular anatomy. RADIATION DOSE REDUCTION: This exam was performed according to the departmental dose-optimization program which includes automated exposure control, adjustment of the mA and/or kV according to patient size and/or use of iterative reconstruction technique. CONTRAST:  OMNIPAQUE IOHEXOL 350 MG/ML SOLN COMPARISON:  Overlapping portions CT abdomen 01/26/2022 FINDINGS: Despite efforts by the technologist and patient, motion artifact is present on today's exam and could not be eliminated. This reduces exam sensitivity and specificity.  Body habitus reduces diagnostic sensitivity and specificity. Cardiovascular: Good contrast timing bolus in the pulmonary arteries but there is substantial heterogeneity of density in the pulmonary arteries due to several factors including arm positioning, body habitus, motion artifact, and streak related to a right proximal humeral prosthesis. In particular, this streak appears to be aligned with a more hypodense subsegmental branch of the right upper lobe pulmonary artery anteriorly, and is the likely cause of the hypodensity in this vicinity for example on image 92 series 10. I do not see definite pulmonary embolus, with the understanding that there is reduced sensitivity for small (segmental and subsegmental) pulmonary emboli related to body habitus, motion artifact, and arm positioning. Mediastinum/Nodes: Coronary, aortic arch, and branch vessel atherosclerotic vascular disease. Moderate cardiomegaly. Lungs/Pleura: Airspace opacity noted posteriorly in the right upper lobe, in the posterior basal segment right lower lobe, in the left upper lobe (image 55, series 12) and in the lingula. Appearance suspicious for multilobar pneumonia. There is some dependent atelectasis in the left lower lobe. Upper Abdomen: Unremarkable Musculoskeletal: Thoracic spondylosis. Review of the MIP images confirms the above findings. IMPRESSION: 1. No definite pulmonary embolus is identified; reduced sensitivity for small (segmental and subsegmental) pulmonary emboli related to body habitus, motion artifact, artifact from right proximal humeral prosthesis, and arm positioning. 2. Multifocal airspace opacities in both lungs, suspicious for multilobar pneumonia. 3. Moderate cardiomegaly. 4. Coronary, aortic arch, and branch vessel atherosclerotic vascular disease. 5. Thoracic spondylosis. 6. Aortic atherosclerosis. Aortic Atherosclerosis (ICD10-I70.0). Electronically Signed   By: Gaylyn Rong M.D.   On: 04/28/2023 15:16   DG  Chest Port 1 View  Result Date: 04/28/2023 CLINICAL DATA:  Evaluate for abnormality.  Possible sepsis. EXAM: PORTABLE CHEST 1 VIEW COMPARISON:  02/25/2022 FINDINGS: Cardiac enlargement. Aortic atherosclerotic calcifications. Bilateral upper lobe peripheral opacities are new when compared with the previous exam. There is also a new opacity identified within the right lung base. No signs of pleural effusion or edema. Previous right shoulder arthroplasty. IMPRESSION: New bilateral upper lobe peripheral opacities and right lung base opacity compatible with multifocal pneumonia. Followup PA and lateral chest X-ray is recommended in 3-4 weeks following trial of antibiotic therapy to ensure resolution and exclude underlying malignancy. Electronically Signed   By: Signa Kell M.D.   On: 04/28/2023 12:08    Microbiology: Results for orders placed or performed during the hospital encounter of  04/28/23  Blood Culture (routine x 2)     Status: None   Collection Time: 04/28/23 11:25 AM   Specimen: BLOOD  Result Value Ref Range Status   Specimen Description   Final    BLOOD BLOOD LEFT FOREARM Performed at [facility], [address], [place], [place] [address]    Special Requests   Final    Blood Culture results may not be optimal due to an inadequate volume of blood received in culture bottles BOTTLES DRAWN AEROBIC ONLY Performed at [facility], [address], [place], [place] [address]    Culture   Final    NO GROWTH 5 DAYS Performed at [facility], [address] [place], [place] [address]    Report Status 05/03/2023 FINAL  Final  Blood Culture (routine x 2)     Status: None   Collection Time: 04/28/23 11:30 AM   Specimen: BLOOD  Result Value Ref Range Status   Specimen Description   Final    BLOOD RIGHT ANTECUBITAL Performed at [facility], [address], [place], [place] [address]    Special Requests   Final    BOTTLES  DRAWN AEROBIC AND ANAEROBIC Blood Culture results may not be optimal due to an inadequate volume of blood received in culture bottles Performed at [facility], [address] [place], [place] [address]    Culture   Final    NO GROWTH 5 DAYS Performed at [facility], [address], [place], [place] [address]    Report Status 05/03/2023 FINAL  Final  MRSA Next Gen by PCR, Nasal     Status: None   Collection Time: 04/28/23  3:53 PM   Specimen: Nasal Mucosa; Nasal Swab  Result Value Ref Range Status   MRSA by PCR Next Gen NOT DETECTED NOT DETECTED Final    Comment: (NOTE) The GeneXpert MRSA Assay (FDA approved for NASAL specimens only), is one component of a comprehensive MRSA colonization surveillance program. It is not intended to diagnose MRSA infection nor to guide or monitor treatment for MRSA infections. Test performance is not FDA approved in patients less than 56 years old. Performed at [facility], [address] [place], [place] [address]   SARS Coronavirus 2 by RT PCR (hospital order, performed in [facility] hospital lab) *cepheid single result test* Urine, Unspecified Source     Status: None   Collection Time: 04/28/23  4:12 PM   Specimen: Urine, Unspecified Source; Nasal Swab  Result Value Ref Range Status   SARS Coronavirus 2 by RT PCR NEGATIVE NEGATIVE Final    Comment: (NOTE) SARS-CoV-2 target nucleic acids are NOT DETECTED.  The SARS-CoV-2 RNA is generally detectable in upper and lower respiratory specimens during the acute phase of infection. The lowest concentration of SARS-CoV-2 viral copies this assay can detect is 250 copies / mL. A negative result does not preclude SARS-CoV-2 infection and should not be used as the sole basis for treatment or other patient management decisions.  A negative result may occur with improper specimen collection / handling, submission of specimen other than nasopharyngeal swab,  presence of viral mutation(s) within the areas targeted by this assay, and inadequate number of viral copies (<250 copies / mL). A negative result must be combined with clinical observations, patient history, and epidemiological information.  Fact Sheet for Patients:   RoadLapTop.co.za  Fact Sheet for Healthcare Providers: http://kim-miller.com/  This test is not  yet approved or  cleared by the [place] and has been authorized for detection and/or diagnosis of SARS-CoV-2 by FDA under an Emergency Use Authorization (EUA).  This EUA will remain in effect (meaning this test can be used) for the duration of the COVID-19 declaration under Section 564(b)(1) of the Act, 21 U.S.C. section 360bbb-3(b)(1), unless the authorization is terminated or revoked sooner.  Performed at [facility], [address], [place], [place] [address]   Urine Culture     Status: Abnormal   Collection Time: 04/28/23  4:12 PM   Specimen: Urine, Random  Result Value Ref Range Status   Specimen Description   Final    URINE, RANDOM Performed at [facility], [address], [place], [place] [address]    Special Requests   Final    NONE Reflexed from [id number] Performed at [facility], [address] [place], [place] [address]    Culture (A)  Final    50,000 COLONIES/mL ESCHERICHIA COLI Confirmed Extended Spectrum Beta-Lactamase Producer (ESBL).  In bloodstream infections from ESBL organisms, carbapenems are preferred over piperacillin/tazobactam. They are shown to have a lower risk of mortality.    Report Status 04/30/2023 FINAL  Final   Organism ID, Bacteria ESCHERICHIA COLI (A)  Final      Susceptibility   Escherichia coli - MIC*    AMPICILLIN >=32 RESISTANT Resistant     CEFAZOLIN >=64 RESISTANT Resistant     CEFEPIME 16 RESISTANT Resistant     CIPROFLOXACIN >=4 RESISTANT Resistant      GENTAMICIN >=16 RESISTANT Resistant     IMIPENEM <=0.25 SENSITIVE Sensitive     NITROFURANTOIN <=16 SENSITIVE Sensitive     TRIMETH/SULFA >=320 RESISTANT Resistant     AMPICILLIN/SULBACTAM >=32 RESISTANT Resistant     PIP/TAZO <=4 SENSITIVE Sensitive     * 50,000 COLONIES/mL ESCHERICHIA COLI    Labs: CBC: Recent Labs  Lab 05/01/23 0311 05/02/23 0310 05/03/23 0418 05/04/23 0347 05/05/23 0400  WBC 4.1 5.7 5.2 6.0 7.1  NEUTROABS 2.6 2.7 2.8 3.4 4.0  HGB 10.3* 10.0* 10.4* 10.3* 10.4*  HCT 36.6 35.3* 36.0 35.2* 35.0*  MCV 105.2* 106.0* 104.3* 103.5* 100.6*  PLT 230 223 215 236 232   Basic Metabolic Panel: Recent Labs  Lab 04/30/23 0329 05/01/23 0311 05/02/23 0310 05/03/23 0418 05/04/23 0347  NA 137 136 137 138 139  K 3.7 3.5 3.1* 4.0 3.8  CL 92* 92* 94* 96* 98  CO2 33* 31 33* 32 31  GLUCOSE 217* 255* 198* 177* 181*  BUN 46* 52* 52* 43* 39*  CREATININE 1.54* 1.34* 1.41* 1.08* 1.07*  CALCIUM 8.2* 8.3* 8.2* 8.5* 8.5*   Liver Function Tests: No results for input(s): "AST", "ALT", "ALKPHOS", "BILITOT", "PROT", "ALBUMIN" in the last 168 hours. CBG: Recent Labs  Lab 05/04/23 1158 05/04/23 1647 05/04/23 2120 05/05/23 0739 05/05/23 1211  GLUCAP 184* 230* 240* 134* 179*    Discharge time spent: greater than 30 minutes.  Signed: Briant Cedar, MD [facility] 05/05/2023

## 2023-05-13 ENCOUNTER — Ambulatory Visit (INDEPENDENT_AMBULATORY_CARE_PROVIDER_SITE_OTHER): Payer: Medicare HMO | Admitting: Podiatry

## 2023-05-13 DIAGNOSIS — M79675 Pain in left toe(s): Secondary | ICD-10-CM | POA: Diagnosis not present

## 2023-05-13 DIAGNOSIS — I739 Peripheral vascular disease, unspecified: Secondary | ICD-10-CM | POA: Diagnosis not present

## 2023-05-13 DIAGNOSIS — B351 Tinea unguium: Secondary | ICD-10-CM

## 2023-05-13 DIAGNOSIS — E1142 Type 2 diabetes mellitus with diabetic polyneuropathy: Secondary | ICD-10-CM

## 2023-05-13 DIAGNOSIS — M79674 Pain in right toe(s): Secondary | ICD-10-CM

## 2023-05-13 NOTE — Progress Notes (Signed)
  Subjective:  Patient ID: Debbie Arnold, female    DOB: [birth year],  MRN: [id number]  Debbie Arnold presents to clinic today for at risk foot care. Pt has h/o NIDDM with PAD and painful, discolored, thick toenails which interfere with daily activities. Patient is accompanied by her son on today's visit. Son has decided after today, patient will be seeing her facility Podiatrist. Chief Complaint  Patient presents with   Nail Problem    DFC,A1C:7.3,Referring Provider Martyn Malay, MD,LOV:07/22,recent patient of [facility] few weeks ago,B/S:unknown      New problem(s): None.   PCP is Martyn Malay, MD.  Allergies  Allergen Reactions   Brimonidine Itching    Not listed on med list from [facility]   Celecoxib Other (See Comments)   Red Dye Other (See Comments)    Not listed on med list from [facility]   Codeine Anxiety    Review of Systems: Negative except as noted in the HPI.  Objective: No changes noted in today's physical examination. There were no vitals filed for this visit. Debbie Arnold is a pleasant 89 y.o. female morbidly obese in NAD. AAO x 3.  Vascular Examination: CFT <3 seconds b/l. DP pulses faintly palpable b/l. PT pulses nonpalpable due to edema. Skin temperature gradient warm to warm b/l. No pain with calf compression. No ischemia or gangrene. No cyanosis or clubbing noted b/l. Dependent edema noted b/l LE. Evidence of chronic venous insufficiency b/l LE. No ischemia or gangrene noted b/l LE. No cyanosis or clubbing noted b/l LE.   Neurological Examination: Pt has subjective symptoms of neuropathy. Protective sensation decreased with 10 gram monofilament b/l.  Dermatological Examination: Pedal skin warm and supple b/l.   No open wounds. No interdigital macerations.  Toenails 1-5 b/l thick, discolored, elongated with subungual debris and pain on dorsal palpation.    No pressure injuries noted.  Pedal skin is warm and supple b/l LE. No open wounds b/l  LE. No interdigital macerations noted b/l LE. Toenails 1-5 b/l elongated, discolored, dystrophic, thickened, crumbly with subungual debris and tenderness to dorsal palpation. No hyperkeratotic nor porokeratotic lesions present on today's visit.  Musculoskeletal Examination: Muscle strength 5/5 to b/l LE. No pain, crepitus or joint limitation noted with ROM bilateral LE. Pes planus deformity noted bilateral LE. Wearing Crocs clogs. Utilizes wheelchair for mobility assistance.  DME: Sitting on [employer] harness in place in wheelchair.  Radiographs: None  Assessment/Plan: 1. Pain due to onychomycosis of toenails of both feet   2. PAD (peripheral artery disease) (HCC)   3. Diabetic peripheral neuropathy associated with type 2 diabetes mellitus (HCC)     -Patient was evaluated and treated. All patient's and/or POA's questions/concerns answered on today's visit. -No new findings. No new orders. -Per son, patient will be seeing facility podiatrist going forward. -Facility to continue fall precautions and pressure precautions. -Patient to continue soft, supportive shoe gear daily. -Toenails 1-5 b/l were debrided in length and girth with sterile nail nippers and dremel without iatrogenic bleeding.  -Patient/POA to call should there be question/concern in the interim.   No follow-ups on file.  Freddie Breech, DPM

## 2023-05-19 ENCOUNTER — Encounter: Payer: Self-pay | Admitting: Podiatry

## 2023-08-10 DIAGNOSIS — F32A Depression, unspecified: Secondary | ICD-10-CM | POA: Diagnosis not present

## 2023-08-10 DIAGNOSIS — F03A2 Unspecified dementia, mild, with psychotic disturbance: Secondary | ICD-10-CM | POA: Diagnosis not present

## 2023-08-10 DIAGNOSIS — F411 Generalized anxiety disorder: Secondary | ICD-10-CM | POA: Diagnosis not present

## 2023-08-17 DIAGNOSIS — R262 Difficulty in walking, not elsewhere classified: Secondary | ICD-10-CM | POA: Diagnosis not present

## 2023-08-17 DIAGNOSIS — M6281 Muscle weakness (generalized): Secondary | ICD-10-CM | POA: Diagnosis not present

## 2023-08-17 DIAGNOSIS — M25512 Pain in left shoulder: Secondary | ICD-10-CM | POA: Diagnosis not present

## 2023-08-17 DIAGNOSIS — M25511 Pain in right shoulder: Secondary | ICD-10-CM | POA: Diagnosis not present

## 2023-08-17 DIAGNOSIS — M542 Cervicalgia: Secondary | ICD-10-CM | POA: Diagnosis not present

## 2023-09-05 DIAGNOSIS — Z79899 Other long term (current) drug therapy: Secondary | ICD-10-CM | POA: Diagnosis not present

## 2023-09-11 DIAGNOSIS — M5030 Other cervical disc degeneration, unspecified cervical region: Secondary | ICD-10-CM | POA: Diagnosis not present

## 2023-09-11 DIAGNOSIS — M25619 Stiffness of unspecified shoulder, not elsewhere classified: Secondary | ICD-10-CM | POA: Diagnosis not present

## 2023-09-11 DIAGNOSIS — G9520 Unspecified cord compression: Secondary | ICD-10-CM | POA: Diagnosis not present

## 2023-09-11 DIAGNOSIS — Z1330 Encounter for screening examination for mental health and behavioral disorders, unspecified: Secondary | ICD-10-CM | POA: Diagnosis not present

## 2023-09-12 DIAGNOSIS — Z79899 Other long term (current) drug therapy: Secondary | ICD-10-CM | POA: Diagnosis not present

## 2023-09-22 DIAGNOSIS — M542 Cervicalgia: Secondary | ICD-10-CM | POA: Diagnosis not present

## 2023-09-22 DIAGNOSIS — M25511 Pain in right shoulder: Secondary | ICD-10-CM | POA: Diagnosis not present

## 2023-09-22 DIAGNOSIS — M6281 Muscle weakness (generalized): Secondary | ICD-10-CM | POA: Diagnosis not present

## 2023-09-22 DIAGNOSIS — M25512 Pain in left shoulder: Secondary | ICD-10-CM | POA: Diagnosis not present

## 2023-09-22 DIAGNOSIS — R262 Difficulty in walking, not elsewhere classified: Secondary | ICD-10-CM | POA: Diagnosis not present

## 2023-09-23 DIAGNOSIS — Z79899 Other long term (current) drug therapy: Secondary | ICD-10-CM | POA: Diagnosis not present

## 2023-09-28 DIAGNOSIS — M25511 Pain in right shoulder: Secondary | ICD-10-CM | POA: Diagnosis not present

## 2023-09-28 DIAGNOSIS — M25512 Pain in left shoulder: Secondary | ICD-10-CM | POA: Diagnosis not present

## 2023-09-28 DIAGNOSIS — R262 Difficulty in walking, not elsewhere classified: Secondary | ICD-10-CM | POA: Diagnosis not present

## 2023-09-28 DIAGNOSIS — M6281 Muscle weakness (generalized): Secondary | ICD-10-CM | POA: Diagnosis not present

## 2023-09-28 DIAGNOSIS — M542 Cervicalgia: Secondary | ICD-10-CM | POA: Diagnosis not present

## 2023-10-02 DIAGNOSIS — M25511 Pain in right shoulder: Secondary | ICD-10-CM | POA: Diagnosis not present

## 2023-10-02 DIAGNOSIS — M25512 Pain in left shoulder: Secondary | ICD-10-CM | POA: Diagnosis not present

## 2023-10-02 DIAGNOSIS — M6281 Muscle weakness (generalized): Secondary | ICD-10-CM | POA: Diagnosis not present

## 2023-10-02 DIAGNOSIS — Z79899 Other long term (current) drug therapy: Secondary | ICD-10-CM | POA: Diagnosis not present

## 2023-10-02 DIAGNOSIS — R262 Difficulty in walking, not elsewhere classified: Secondary | ICD-10-CM | POA: Diagnosis not present

## 2023-10-02 DIAGNOSIS — M542 Cervicalgia: Secondary | ICD-10-CM | POA: Diagnosis not present

## 2023-10-07 DIAGNOSIS — L603 Nail dystrophy: Secondary | ICD-10-CM | POA: Diagnosis not present

## 2023-10-07 DIAGNOSIS — M542 Cervicalgia: Secondary | ICD-10-CM | POA: Diagnosis not present

## 2023-10-07 DIAGNOSIS — Z79899 Other long term (current) drug therapy: Secondary | ICD-10-CM | POA: Diagnosis not present

## 2023-10-07 DIAGNOSIS — M25512 Pain in left shoulder: Secondary | ICD-10-CM | POA: Diagnosis not present

## 2023-10-07 DIAGNOSIS — Z7984 Long term (current) use of oral hypoglycemic drugs: Secondary | ICD-10-CM | POA: Diagnosis not present

## 2023-10-07 DIAGNOSIS — M6281 Muscle weakness (generalized): Secondary | ICD-10-CM | POA: Diagnosis not present

## 2023-10-07 DIAGNOSIS — L602 Onychogryphosis: Secondary | ICD-10-CM | POA: Diagnosis not present

## 2023-10-07 DIAGNOSIS — E1151 Type 2 diabetes mellitus with diabetic peripheral angiopathy without gangrene: Secondary | ICD-10-CM | POA: Diagnosis not present

## 2023-10-07 DIAGNOSIS — M25511 Pain in right shoulder: Secondary | ICD-10-CM | POA: Diagnosis not present

## 2023-10-07 DIAGNOSIS — R262 Difficulty in walking, not elsewhere classified: Secondary | ICD-10-CM | POA: Diagnosis not present

## 2023-10-12 DIAGNOSIS — Z79899 Other long term (current) drug therapy: Secondary | ICD-10-CM | POA: Diagnosis not present

## 2023-10-19 DIAGNOSIS — F03A2 Unspecified dementia, mild, with psychotic disturbance: Secondary | ICD-10-CM | POA: Diagnosis not present

## 2023-10-19 DIAGNOSIS — F411 Generalized anxiety disorder: Secondary | ICD-10-CM | POA: Diagnosis not present

## 2023-10-19 DIAGNOSIS — F32A Depression, unspecified: Secondary | ICD-10-CM | POA: Diagnosis not present

## 2023-10-21 DIAGNOSIS — S31809A Unspecified open wound of unspecified buttock, initial encounter: Secondary | ICD-10-CM | POA: Diagnosis not present

## 2023-10-27 DIAGNOSIS — Z79899 Other long term (current) drug therapy: Secondary | ICD-10-CM | POA: Diagnosis not present

## 2023-10-28 DIAGNOSIS — S31809A Unspecified open wound of unspecified buttock, initial encounter: Secondary | ICD-10-CM | POA: Diagnosis not present

## 2023-10-28 DIAGNOSIS — L988 Other specified disorders of the skin and subcutaneous tissue: Secondary | ICD-10-CM | POA: Diagnosis not present

## 2023-11-04 DIAGNOSIS — L988 Other specified disorders of the skin and subcutaneous tissue: Secondary | ICD-10-CM | POA: Diagnosis not present

## 2023-11-09 DIAGNOSIS — Z79899 Other long term (current) drug therapy: Secondary | ICD-10-CM | POA: Diagnosis not present

## 2023-11-10 DIAGNOSIS — E1165 Type 2 diabetes mellitus with hyperglycemia: Secondary | ICD-10-CM | POA: Diagnosis not present

## 2023-11-10 DIAGNOSIS — E039 Hypothyroidism, unspecified: Secondary | ICD-10-CM | POA: Diagnosis not present

## 2023-11-13 DIAGNOSIS — Z79899 Other long term (current) drug therapy: Secondary | ICD-10-CM | POA: Diagnosis not present

## 2023-11-14 DIAGNOSIS — R8271 Bacteriuria: Secondary | ICD-10-CM | POA: Diagnosis not present

## 2023-11-14 DIAGNOSIS — H159 Unspecified disorder of sclera: Secondary | ICD-10-CM | POA: Diagnosis not present

## 2023-11-14 DIAGNOSIS — R946 Abnormal results of thyroid function studies: Secondary | ICD-10-CM | POA: Diagnosis not present

## 2023-11-14 DIAGNOSIS — R7989 Other specified abnormal findings of blood chemistry: Secondary | ICD-10-CM | POA: Diagnosis not present

## 2023-11-20 DIAGNOSIS — Z79899 Other long term (current) drug therapy: Secondary | ICD-10-CM | POA: Diagnosis not present

## 2023-11-23 DIAGNOSIS — E039 Hypothyroidism, unspecified: Secondary | ICD-10-CM | POA: Diagnosis not present

## 2023-11-23 DIAGNOSIS — E1165 Type 2 diabetes mellitus with hyperglycemia: Secondary | ICD-10-CM | POA: Diagnosis not present

## 2023-11-27 DIAGNOSIS — Z79899 Other long term (current) drug therapy: Secondary | ICD-10-CM | POA: Diagnosis not present

## 2023-11-28 DIAGNOSIS — N390 Urinary tract infection, site not specified: Secondary | ICD-10-CM | POA: Diagnosis not present

## 2023-11-30 DIAGNOSIS — B961 Klebsiella pneumoniae [K. pneumoniae] as the cause of diseases classified elsewhere: Secondary | ICD-10-CM | POA: Diagnosis not present

## 2023-11-30 DIAGNOSIS — N390 Urinary tract infection, site not specified: Secondary | ICD-10-CM | POA: Diagnosis not present

## 2023-11-30 DIAGNOSIS — R8271 Bacteriuria: Secondary | ICD-10-CM | POA: Diagnosis not present

## 2023-12-04 DIAGNOSIS — Z79899 Other long term (current) drug therapy: Secondary | ICD-10-CM | POA: Diagnosis not present

## 2023-12-14 DIAGNOSIS — Z79899 Other long term (current) drug therapy: Secondary | ICD-10-CM | POA: Diagnosis not present

## 2023-12-18 DIAGNOSIS — L603 Nail dystrophy: Secondary | ICD-10-CM | POA: Diagnosis not present

## 2023-12-18 DIAGNOSIS — Z794 Long term (current) use of insulin: Secondary | ICD-10-CM | POA: Diagnosis not present

## 2023-12-18 DIAGNOSIS — L602 Onychogryphosis: Secondary | ICD-10-CM | POA: Diagnosis not present

## 2023-12-18 DIAGNOSIS — E1151 Type 2 diabetes mellitus with diabetic peripheral angiopathy without gangrene: Secondary | ICD-10-CM | POA: Diagnosis not present

## 2023-12-21 DIAGNOSIS — F32A Depression, unspecified: Secondary | ICD-10-CM | POA: Diagnosis not present

## 2023-12-21 DIAGNOSIS — F03A2 Unspecified dementia, mild, with psychotic disturbance: Secondary | ICD-10-CM | POA: Diagnosis not present

## 2023-12-21 DIAGNOSIS — F411 Generalized anxiety disorder: Secondary | ICD-10-CM | POA: Diagnosis not present

## 2023-12-22 DIAGNOSIS — Z79899 Other long term (current) drug therapy: Secondary | ICD-10-CM | POA: Diagnosis not present

## 2023-12-23 IMAGING — DX DG CHEST 2V
2 series · 2 of 2 positions shown · non-contrast
Comparison: 12/29/2021

CLINICAL DATA: Chest pain

EXAM:
CHEST - 2 VIEW

[chest lat]
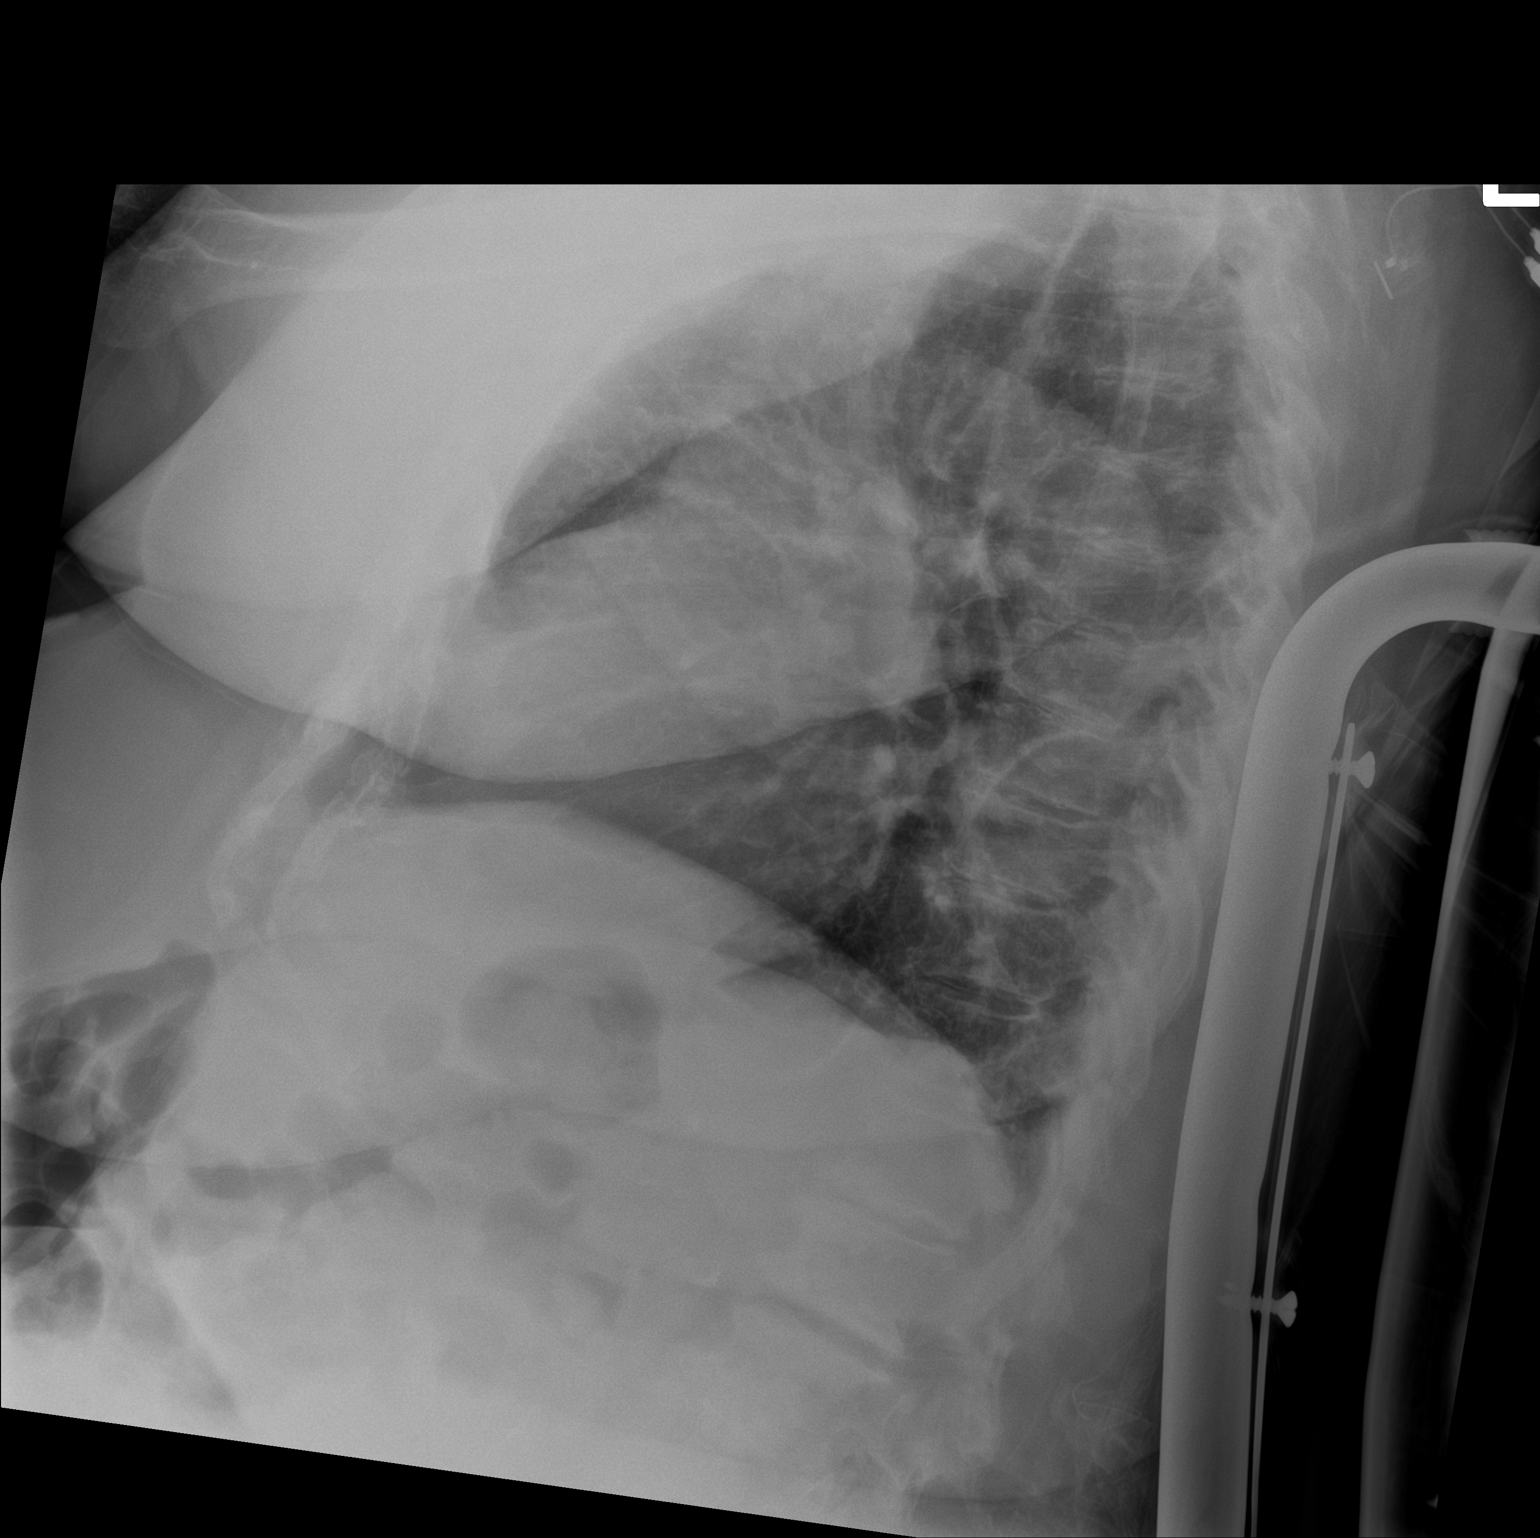

[chest ap]
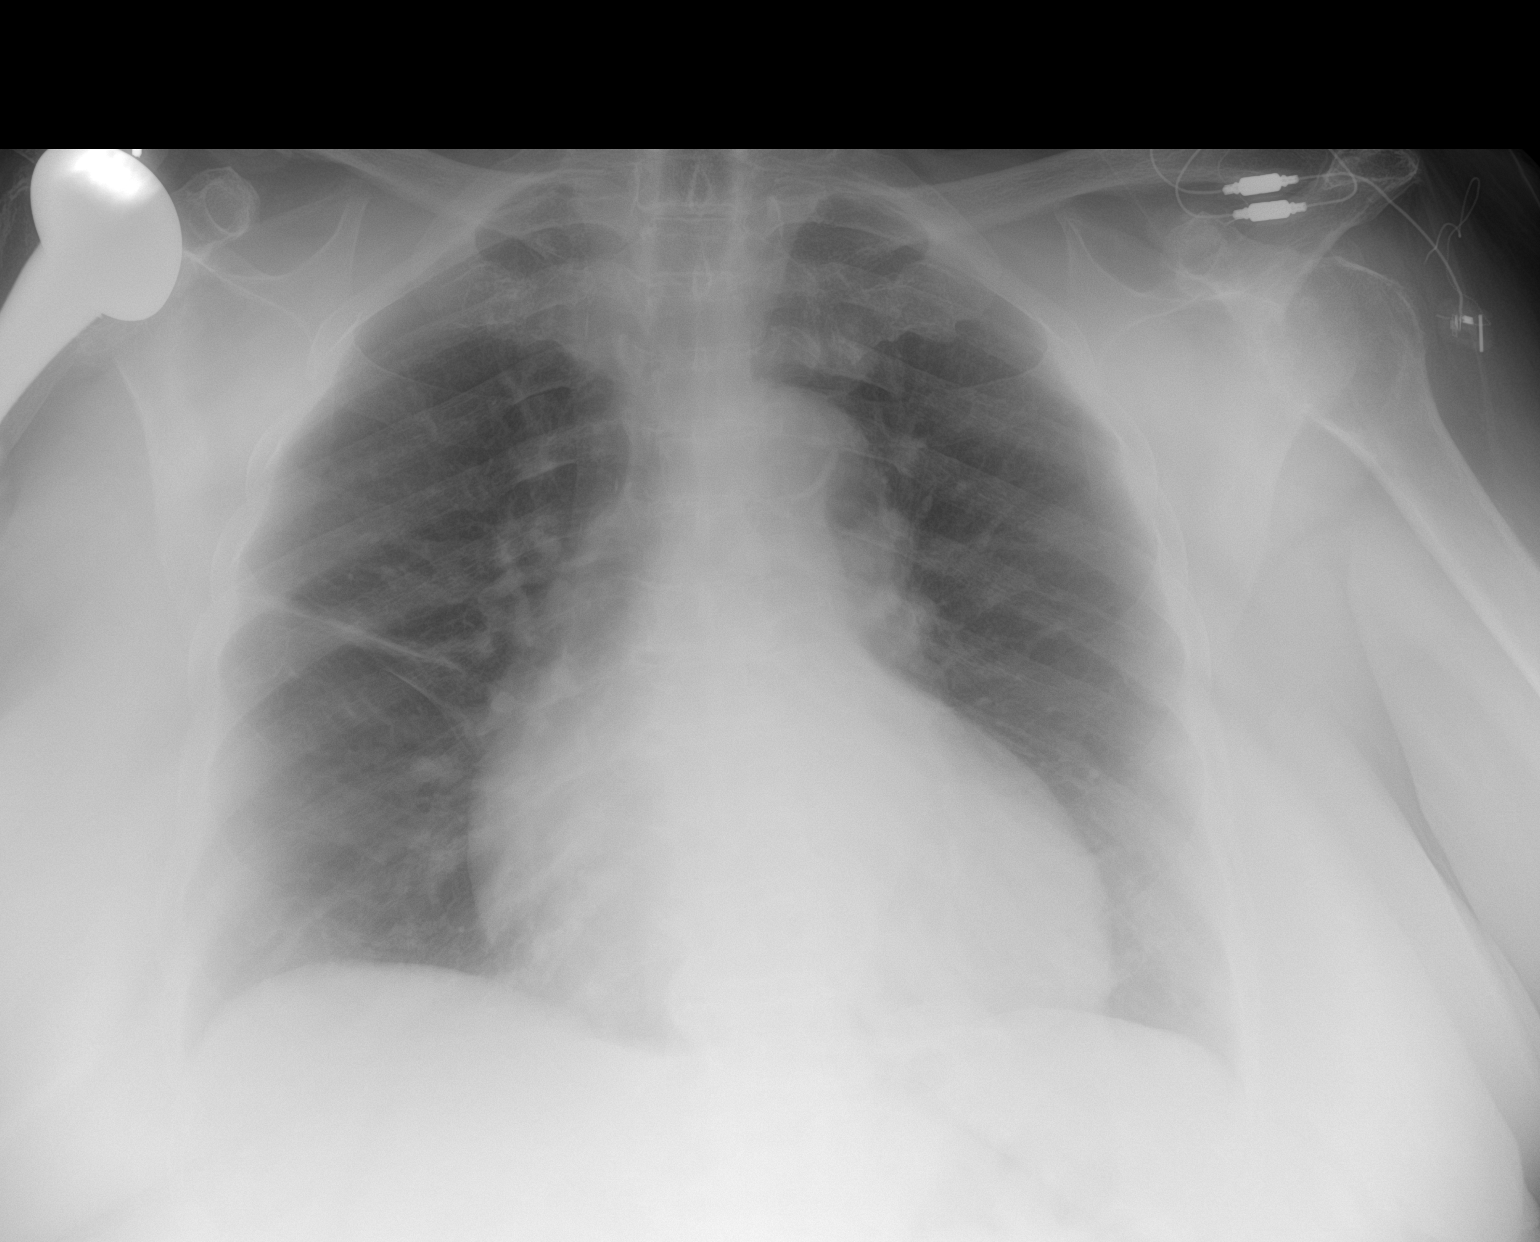

[2 of 2 positions shown; findings below may reference images not displayed]

FINDINGS: Transverse diameter of heart is increased. There are no signs of
pulmonary edema or focal pulmonary consolidation. Transverse linear
density in the right parahilar region has not changed suggesting
scarring or subsegmental atelectasis. There is no pleural effusion
or pneumothorax. There is previous right shoulder arthroplasty.
IMPRESSION: Cardiomegaly. There are no new infiltrates or signs of pulmonary
edema.

## 2023-12-29 DIAGNOSIS — Z79899 Other long term (current) drug therapy: Secondary | ICD-10-CM | POA: Diagnosis not present

## 2024-01-04 DIAGNOSIS — F03A2 Unspecified dementia, mild, with psychotic disturbance: Secondary | ICD-10-CM | POA: Diagnosis not present

## 2024-01-04 DIAGNOSIS — F411 Generalized anxiety disorder: Secondary | ICD-10-CM | POA: Diagnosis not present

## 2024-01-04 DIAGNOSIS — Z79899 Other long term (current) drug therapy: Secondary | ICD-10-CM | POA: Diagnosis not present

## 2024-01-04 DIAGNOSIS — F32A Depression, unspecified: Secondary | ICD-10-CM | POA: Diagnosis not present

## 2024-01-12 DIAGNOSIS — Z79899 Other long term (current) drug therapy: Secondary | ICD-10-CM | POA: Diagnosis not present

## 2024-01-18 DIAGNOSIS — N3020 Other chronic cystitis without hematuria: Secondary | ICD-10-CM | POA: Diagnosis not present

## 2024-01-19 DIAGNOSIS — Z79899 Other long term (current) drug therapy: Secondary | ICD-10-CM | POA: Diagnosis not present

## 2024-01-25 DIAGNOSIS — R202 Paresthesia of skin: Secondary | ICD-10-CM | POA: Diagnosis not present

## 2024-01-25 DIAGNOSIS — E1165 Type 2 diabetes mellitus with hyperglycemia: Secondary | ICD-10-CM | POA: Diagnosis not present

## 2024-01-25 DIAGNOSIS — E039 Hypothyroidism, unspecified: Secondary | ICD-10-CM | POA: Diagnosis not present

## 2024-01-25 DIAGNOSIS — R200 Anesthesia of skin: Secondary | ICD-10-CM | POA: Diagnosis not present

## 2024-01-26 DIAGNOSIS — Z79899 Other long term (current) drug therapy: Secondary | ICD-10-CM | POA: Diagnosis not present

## 2024-01-31 DIAGNOSIS — E039 Hypothyroidism, unspecified: Secondary | ICD-10-CM | POA: Diagnosis not present

## 2024-01-31 DIAGNOSIS — E1140 Type 2 diabetes mellitus with diabetic neuropathy, unspecified: Secondary | ICD-10-CM | POA: Diagnosis not present

## 2024-01-31 DIAGNOSIS — N952 Postmenopausal atrophic vaginitis: Secondary | ICD-10-CM | POA: Diagnosis not present

## 2024-02-02 DIAGNOSIS — Z79899 Other long term (current) drug therapy: Secondary | ICD-10-CM | POA: Diagnosis not present

## 2024-02-08 DIAGNOSIS — R300 Dysuria: Secondary | ICD-10-CM | POA: Diagnosis not present

## 2024-02-08 DIAGNOSIS — N3020 Other chronic cystitis without hematuria: Secondary | ICD-10-CM | POA: Diagnosis not present

## 2024-02-10 DIAGNOSIS — Z79899 Other long term (current) drug therapy: Secondary | ICD-10-CM | POA: Diagnosis not present

## 2024-02-17 DIAGNOSIS — Z79899 Other long term (current) drug therapy: Secondary | ICD-10-CM | POA: Diagnosis not present

## 2024-02-24 DIAGNOSIS — Z79899 Other long term (current) drug therapy: Secondary | ICD-10-CM | POA: Diagnosis not present

## 2024-02-29 DIAGNOSIS — F32A Depression, unspecified: Secondary | ICD-10-CM | POA: Diagnosis not present

## 2024-02-29 DIAGNOSIS — F411 Generalized anxiety disorder: Secondary | ICD-10-CM | POA: Diagnosis not present

## 2024-02-29 DIAGNOSIS — F03A2 Unspecified dementia, mild, with psychotic disturbance: Secondary | ICD-10-CM | POA: Diagnosis not present

## 2024-03-01 DIAGNOSIS — Z79899 Other long term (current) drug therapy: Secondary | ICD-10-CM | POA: Diagnosis not present

## 2024-03-02 DIAGNOSIS — Z79899 Other long term (current) drug therapy: Secondary | ICD-10-CM | POA: Diagnosis not present

## 2024-03-04 DIAGNOSIS — M6281 Muscle weakness (generalized): Secondary | ICD-10-CM | POA: Diagnosis not present

## 2024-03-04 DIAGNOSIS — M79641 Pain in right hand: Secondary | ICD-10-CM | POA: Diagnosis not present

## 2024-03-04 DIAGNOSIS — R278 Other lack of coordination: Secondary | ICD-10-CM | POA: Diagnosis not present

## 2024-03-04 DIAGNOSIS — M48062 Spinal stenosis, lumbar region with neurogenic claudication: Secondary | ICD-10-CM | POA: Diagnosis not present

## 2024-03-07 DIAGNOSIS — M79641 Pain in right hand: Secondary | ICD-10-CM | POA: Diagnosis not present

## 2024-03-07 DIAGNOSIS — R278 Other lack of coordination: Secondary | ICD-10-CM | POA: Diagnosis not present

## 2024-03-07 DIAGNOSIS — M6281 Muscle weakness (generalized): Secondary | ICD-10-CM | POA: Diagnosis not present

## 2024-03-07 DIAGNOSIS — M48062 Spinal stenosis, lumbar region with neurogenic claudication: Secondary | ICD-10-CM | POA: Diagnosis not present

## 2024-03-08 ENCOUNTER — Other Ambulatory Visit (HOSPITAL_COMMUNITY): Payer: Self-pay | Admitting: Pediatric Surgery

## 2024-03-08 DIAGNOSIS — R278 Other lack of coordination: Secondary | ICD-10-CM | POA: Diagnosis not present

## 2024-03-08 DIAGNOSIS — H401134 Primary open-angle glaucoma, bilateral, indeterminate stage: Secondary | ICD-10-CM | POA: Diagnosis not present

## 2024-03-08 DIAGNOSIS — M6281 Muscle weakness (generalized): Secondary | ICD-10-CM | POA: Diagnosis not present

## 2024-03-08 DIAGNOSIS — M48062 Spinal stenosis, lumbar region with neurogenic claudication: Secondary | ICD-10-CM | POA: Diagnosis not present

## 2024-03-08 DIAGNOSIS — M79641 Pain in right hand: Secondary | ICD-10-CM | POA: Diagnosis not present

## 2024-03-08 DIAGNOSIS — R29898 Other symptoms and signs involving the musculoskeletal system: Secondary | ICD-10-CM

## 2024-03-08 DIAGNOSIS — Z7951 Long term (current) use of inhaled steroids: Secondary | ICD-10-CM | POA: Diagnosis not present

## 2024-03-08 DIAGNOSIS — M5411 Radiculopathy, occipito-atlanto-axial region: Secondary | ICD-10-CM

## 2024-03-08 DIAGNOSIS — E119 Type 2 diabetes mellitus without complications: Secondary | ICD-10-CM | POA: Diagnosis not present

## 2024-03-08 DIAGNOSIS — Z7952 Long term (current) use of systemic steroids: Secondary | ICD-10-CM | POA: Diagnosis not present

## 2024-03-09 DIAGNOSIS — M6281 Muscle weakness (generalized): Secondary | ICD-10-CM | POA: Diagnosis not present

## 2024-03-09 DIAGNOSIS — M48062 Spinal stenosis, lumbar region with neurogenic claudication: Secondary | ICD-10-CM | POA: Diagnosis not present

## 2024-03-09 DIAGNOSIS — M79641 Pain in right hand: Secondary | ICD-10-CM | POA: Diagnosis not present

## 2024-03-09 DIAGNOSIS — R278 Other lack of coordination: Secondary | ICD-10-CM | POA: Diagnosis not present

## 2024-03-10 DIAGNOSIS — R278 Other lack of coordination: Secondary | ICD-10-CM | POA: Diagnosis not present

## 2024-03-10 DIAGNOSIS — M48062 Spinal stenosis, lumbar region with neurogenic claudication: Secondary | ICD-10-CM | POA: Diagnosis not present

## 2024-03-10 DIAGNOSIS — M6281 Muscle weakness (generalized): Secondary | ICD-10-CM | POA: Diagnosis not present

## 2024-03-10 DIAGNOSIS — M79641 Pain in right hand: Secondary | ICD-10-CM | POA: Diagnosis not present

## 2024-03-13 DIAGNOSIS — N952 Postmenopausal atrophic vaginitis: Secondary | ICD-10-CM | POA: Diagnosis not present

## 2024-03-13 DIAGNOSIS — I420 Dilated cardiomyopathy: Secondary | ICD-10-CM | POA: Diagnosis not present

## 2024-03-13 DIAGNOSIS — E039 Hypothyroidism, unspecified: Secondary | ICD-10-CM | POA: Diagnosis not present

## 2024-03-13 DIAGNOSIS — E1140 Type 2 diabetes mellitus with diabetic neuropathy, unspecified: Secondary | ICD-10-CM | POA: Diagnosis not present

## 2024-03-13 DIAGNOSIS — F0390 Unspecified dementia without behavioral disturbance: Secondary | ICD-10-CM | POA: Diagnosis not present

## 2024-03-13 DIAGNOSIS — I5030 Unspecified diastolic (congestive) heart failure: Secondary | ICD-10-CM | POA: Diagnosis not present

## 2024-03-13 DIAGNOSIS — J449 Chronic obstructive pulmonary disease, unspecified: Secondary | ICD-10-CM | POA: Diagnosis not present

## 2024-03-15 DIAGNOSIS — M6281 Muscle weakness (generalized): Secondary | ICD-10-CM | POA: Diagnosis not present

## 2024-03-15 DIAGNOSIS — M79641 Pain in right hand: Secondary | ICD-10-CM | POA: Diagnosis not present

## 2024-03-15 DIAGNOSIS — R278 Other lack of coordination: Secondary | ICD-10-CM | POA: Diagnosis not present

## 2024-03-15 DIAGNOSIS — M48062 Spinal stenosis, lumbar region with neurogenic claudication: Secondary | ICD-10-CM | POA: Diagnosis not present

## 2024-03-17 ENCOUNTER — Encounter (HOSPITAL_COMMUNITY): Payer: Self-pay | Admitting: Pediatric Surgery

## 2024-03-17 ENCOUNTER — Other Ambulatory Visit: Payer: Self-pay | Admitting: Internal Medicine

## 2024-03-17 ENCOUNTER — Encounter: Payer: Self-pay | Admitting: Pediatric Surgery

## 2024-03-17 DIAGNOSIS — R29898 Other symptoms and signs involving the musculoskeletal system: Secondary | ICD-10-CM

## 2024-03-17 DIAGNOSIS — M5411 Radiculopathy, occipito-atlanto-axial region: Secondary | ICD-10-CM

## 2024-03-17 DIAGNOSIS — M48062 Spinal stenosis, lumbar region with neurogenic claudication: Secondary | ICD-10-CM | POA: Diagnosis not present

## 2024-03-17 DIAGNOSIS — M6281 Muscle weakness (generalized): Secondary | ICD-10-CM | POA: Diagnosis not present

## 2024-03-17 DIAGNOSIS — M79641 Pain in right hand: Secondary | ICD-10-CM | POA: Diagnosis not present

## 2024-03-17 DIAGNOSIS — R278 Other lack of coordination: Secondary | ICD-10-CM | POA: Diagnosis not present

## 2024-03-17 DIAGNOSIS — Z79899 Other long term (current) drug therapy: Secondary | ICD-10-CM | POA: Diagnosis not present

## 2024-03-18 ENCOUNTER — Ambulatory Visit (HOSPITAL_COMMUNITY)

## 2024-03-22 DIAGNOSIS — Z79899 Other long term (current) drug therapy: Secondary | ICD-10-CM | POA: Diagnosis not present

## 2024-03-23 DIAGNOSIS — R278 Other lack of coordination: Secondary | ICD-10-CM | POA: Diagnosis not present

## 2024-03-23 DIAGNOSIS — L24A9 Irritant contact dermatitis due friction or contact with other specified body fluids: Secondary | ICD-10-CM | POA: Diagnosis not present

## 2024-03-23 DIAGNOSIS — M48062 Spinal stenosis, lumbar region with neurogenic claudication: Secondary | ICD-10-CM | POA: Diagnosis not present

## 2024-03-23 DIAGNOSIS — M6281 Muscle weakness (generalized): Secondary | ICD-10-CM | POA: Diagnosis not present

## 2024-03-23 DIAGNOSIS — M79641 Pain in right hand: Secondary | ICD-10-CM | POA: Diagnosis not present

## 2024-03-25 ENCOUNTER — Encounter (HOSPITAL_COMMUNITY): Payer: Self-pay

## 2024-03-25 ENCOUNTER — Ambulatory Visit (HOSPITAL_COMMUNITY)

## 2024-03-27 DIAGNOSIS — E1140 Type 2 diabetes mellitus with diabetic neuropathy, unspecified: Secondary | ICD-10-CM | POA: Diagnosis not present

## 2024-03-28 DIAGNOSIS — M48062 Spinal stenosis, lumbar region with neurogenic claudication: Secondary | ICD-10-CM | POA: Diagnosis not present

## 2024-03-28 DIAGNOSIS — R278 Other lack of coordination: Secondary | ICD-10-CM | POA: Diagnosis not present

## 2024-03-28 DIAGNOSIS — M79641 Pain in right hand: Secondary | ICD-10-CM | POA: Diagnosis not present

## 2024-03-28 DIAGNOSIS — M6281 Muscle weakness (generalized): Secondary | ICD-10-CM | POA: Diagnosis not present

## 2024-03-29 DIAGNOSIS — Z79899 Other long term (current) drug therapy: Secondary | ICD-10-CM | POA: Diagnosis not present

## 2024-03-30 DIAGNOSIS — L24A9 Irritant contact dermatitis due friction or contact with other specified body fluids: Secondary | ICD-10-CM | POA: Diagnosis not present

## 2024-04-01 DIAGNOSIS — M25512 Pain in left shoulder: Secondary | ICD-10-CM | POA: Diagnosis not present

## 2024-04-01 DIAGNOSIS — M25511 Pain in right shoulder: Secondary | ICD-10-CM | POA: Diagnosis not present

## 2024-04-01 DIAGNOSIS — M79642 Pain in left hand: Secondary | ICD-10-CM | POA: Diagnosis not present

## 2024-04-01 DIAGNOSIS — G894 Chronic pain syndrome: Secondary | ICD-10-CM | POA: Diagnosis not present

## 2024-04-01 DIAGNOSIS — G5642 Causalgia of left upper limb: Secondary | ICD-10-CM | POA: Diagnosis not present

## 2024-04-01 DIAGNOSIS — M79641 Pain in right hand: Secondary | ICD-10-CM | POA: Diagnosis not present

## 2024-04-05 DIAGNOSIS — Z79899 Other long term (current) drug therapy: Secondary | ICD-10-CM | POA: Diagnosis not present

## 2024-04-06 ENCOUNTER — Ambulatory Visit (HOSPITAL_COMMUNITY): Admission: RE | Admit: 2024-04-06 | Source: Ambulatory Visit

## 2024-04-06 ENCOUNTER — Ambulatory Visit (HOSPITAL_COMMUNITY)
Admission: RE | Admit: 2024-04-06 | Discharge: 2024-04-06 | Disposition: A | Discharge status: Home / Self Care | Source: Ambulatory Visit | Attending: Internal Medicine | Admitting: Internal Medicine

## 2024-04-06 DIAGNOSIS — R29898 Other symptoms and signs involving the musculoskeletal system: Secondary | ICD-10-CM | POA: Insufficient documentation

## 2024-04-06 DIAGNOSIS — M4802 Spinal stenosis, cervical region: Secondary | ICD-10-CM | POA: Diagnosis not present

## 2024-04-06 DIAGNOSIS — M47812 Spondylosis without myelopathy or radiculopathy, cervical region: Secondary | ICD-10-CM | POA: Diagnosis not present

## 2024-04-06 DIAGNOSIS — M50223 Other cervical disc displacement at C6-C7 level: Secondary | ICD-10-CM | POA: Diagnosis not present

## 2024-04-06 DIAGNOSIS — M5021 Other cervical disc displacement,  high cervical region: Secondary | ICD-10-CM | POA: Diagnosis not present

## 2024-04-06 DIAGNOSIS — M5411 Radiculopathy, occipito-atlanto-axial region: Secondary | ICD-10-CM | POA: Insufficient documentation

## 2024-04-11 DIAGNOSIS — Z79899 Other long term (current) drug therapy: Secondary | ICD-10-CM | POA: Diagnosis not present

## 2024-04-12 DIAGNOSIS — H2000 Unspecified acute and subacute iridocyclitis: Secondary | ICD-10-CM | POA: Diagnosis not present

## 2024-04-12 DIAGNOSIS — H5010 Unspecified exotropia: Secondary | ICD-10-CM | POA: Diagnosis not present

## 2024-04-12 DIAGNOSIS — Z79899 Other long term (current) drug therapy: Secondary | ICD-10-CM | POA: Diagnosis not present

## 2024-04-12 DIAGNOSIS — H02403 Unspecified ptosis of bilateral eyelids: Secondary | ICD-10-CM | POA: Diagnosis not present

## 2024-04-12 DIAGNOSIS — Z961 Presence of intraocular lens: Secondary | ICD-10-CM | POA: Diagnosis not present

## 2024-04-12 DIAGNOSIS — H109 Unspecified conjunctivitis: Secondary | ICD-10-CM | POA: Diagnosis not present

## 2024-04-12 DIAGNOSIS — H401131 Primary open-angle glaucoma, bilateral, mild stage: Secondary | ICD-10-CM | POA: Diagnosis not present

## 2024-04-15 DIAGNOSIS — N390 Urinary tract infection, site not specified: Secondary | ICD-10-CM | POA: Diagnosis not present

## 2024-04-18 DIAGNOSIS — Z79899 Other long term (current) drug therapy: Secondary | ICD-10-CM | POA: Diagnosis not present

## 2024-04-23 DIAGNOSIS — N390 Urinary tract infection, site not specified: Secondary | ICD-10-CM | POA: Diagnosis not present

## 2024-04-24 DIAGNOSIS — S60522A Blister (nonthermal) of left hand, initial encounter: Secondary | ICD-10-CM | POA: Diagnosis not present

## 2024-04-25 DIAGNOSIS — Z79899 Other long term (current) drug therapy: Secondary | ICD-10-CM | POA: Diagnosis not present

## 2024-04-25 DIAGNOSIS — F32A Depression, unspecified: Secondary | ICD-10-CM | POA: Diagnosis not present

## 2024-04-25 DIAGNOSIS — F411 Generalized anxiety disorder: Secondary | ICD-10-CM | POA: Diagnosis not present

## 2024-04-25 DIAGNOSIS — F03A2 Unspecified dementia, mild, with psychotic disturbance: Secondary | ICD-10-CM | POA: Diagnosis not present

## 2024-04-27 DIAGNOSIS — L602 Onychogryphosis: Secondary | ICD-10-CM | POA: Diagnosis not present

## 2024-04-27 DIAGNOSIS — E1151 Type 2 diabetes mellitus with diabetic peripheral angiopathy without gangrene: Secondary | ICD-10-CM | POA: Diagnosis not present

## 2024-04-27 DIAGNOSIS — Z794 Long term (current) use of insulin: Secondary | ICD-10-CM | POA: Diagnosis not present

## 2024-04-28 DIAGNOSIS — S61205A Unspecified open wound of left ring finger without damage to nail, initial encounter: Secondary | ICD-10-CM | POA: Diagnosis not present

## 2024-04-28 DIAGNOSIS — E039 Hypothyroidism, unspecified: Secondary | ICD-10-CM | POA: Diagnosis not present

## 2024-04-28 DIAGNOSIS — S61002A Unspecified open wound of left thumb without damage to nail, initial encounter: Secondary | ICD-10-CM | POA: Diagnosis not present

## 2024-04-28 DIAGNOSIS — E1165 Type 2 diabetes mellitus with hyperglycemia: Secondary | ICD-10-CM | POA: Diagnosis not present

## 2024-04-30 DIAGNOSIS — S60522A Blister (nonthermal) of left hand, initial encounter: Secondary | ICD-10-CM | POA: Diagnosis not present

## 2024-05-02 DIAGNOSIS — Z79899 Other long term (current) drug therapy: Secondary | ICD-10-CM | POA: Diagnosis not present

## 2024-05-04 DIAGNOSIS — S61205A Unspecified open wound of left ring finger without damage to nail, initial encounter: Secondary | ICD-10-CM | POA: Diagnosis not present

## 2024-05-04 DIAGNOSIS — S61002A Unspecified open wound of left thumb without damage to nail, initial encounter: Secondary | ICD-10-CM | POA: Diagnosis not present

## 2024-05-09 DIAGNOSIS — M25511 Pain in right shoulder: Secondary | ICD-10-CM | POA: Diagnosis not present

## 2024-05-09 DIAGNOSIS — G8929 Other chronic pain: Secondary | ICD-10-CM | POA: Diagnosis not present

## 2024-05-09 DIAGNOSIS — M79641 Pain in right hand: Secondary | ICD-10-CM | POA: Diagnosis not present

## 2024-05-09 DIAGNOSIS — M79642 Pain in left hand: Secondary | ICD-10-CM | POA: Diagnosis not present

## 2024-05-09 DIAGNOSIS — M25512 Pain in left shoulder: Secondary | ICD-10-CM | POA: Diagnosis not present

## 2024-05-10 DIAGNOSIS — Z79899 Other long term (current) drug therapy: Secondary | ICD-10-CM | POA: Diagnosis not present

## 2024-05-11 DIAGNOSIS — Z9981 Dependence on supplemental oxygen: Secondary | ICD-10-CM | POA: Diagnosis not present

## 2024-05-11 DIAGNOSIS — R918 Other nonspecific abnormal finding of lung field: Secondary | ICD-10-CM | POA: Diagnosis not present

## 2024-05-11 DIAGNOSIS — J4540 Moderate persistent asthma, uncomplicated: Secondary | ICD-10-CM | POA: Diagnosis not present

## 2024-05-11 DIAGNOSIS — G4733 Obstructive sleep apnea (adult) (pediatric): Secondary | ICD-10-CM | POA: Diagnosis not present

## 2024-05-11 DIAGNOSIS — R0902 Hypoxemia: Secondary | ICD-10-CM | POA: Diagnosis not present

## 2024-05-11 DIAGNOSIS — R0602 Shortness of breath: Secondary | ICD-10-CM | POA: Diagnosis not present

## 2024-05-11 DIAGNOSIS — I272 Pulmonary hypertension, unspecified: Secondary | ICD-10-CM | POA: Diagnosis not present

## 2024-05-11 DIAGNOSIS — Z86711 Personal history of pulmonary embolism: Secondary | ICD-10-CM | POA: Diagnosis not present

## 2024-05-17 DIAGNOSIS — Z79899 Other long term (current) drug therapy: Secondary | ICD-10-CM | POA: Diagnosis not present

## 2024-05-18 DIAGNOSIS — S61205A Unspecified open wound of left ring finger without damage to nail, initial encounter: Secondary | ICD-10-CM | POA: Diagnosis not present

## 2024-05-18 DIAGNOSIS — S61002A Unspecified open wound of left thumb without damage to nail, initial encounter: Secondary | ICD-10-CM | POA: Diagnosis not present

## 2024-05-20 DIAGNOSIS — D638 Anemia in other chronic diseases classified elsewhere: Secondary | ICD-10-CM | POA: Diagnosis not present

## 2024-05-20 DIAGNOSIS — E039 Hypothyroidism, unspecified: Secondary | ICD-10-CM | POA: Diagnosis not present

## 2024-05-20 DIAGNOSIS — J9611 Chronic respiratory failure with hypoxia: Secondary | ICD-10-CM | POA: Diagnosis not present

## 2024-05-20 DIAGNOSIS — J449 Chronic obstructive pulmonary disease, unspecified: Secondary | ICD-10-CM | POA: Diagnosis not present

## 2024-05-20 DIAGNOSIS — F322 Major depressive disorder, single episode, severe without psychotic features: Secondary | ICD-10-CM | POA: Diagnosis not present

## 2024-05-20 DIAGNOSIS — E1140 Type 2 diabetes mellitus with diabetic neuropathy, unspecified: Secondary | ICD-10-CM | POA: Diagnosis not present

## 2024-05-20 DIAGNOSIS — F0390 Unspecified dementia without behavioral disturbance: Secondary | ICD-10-CM | POA: Diagnosis not present

## 2024-05-20 DIAGNOSIS — I5022 Chronic systolic (congestive) heart failure: Secondary | ICD-10-CM | POA: Diagnosis not present

## 2024-05-24 DIAGNOSIS — Z79899 Other long term (current) drug therapy: Secondary | ICD-10-CM | POA: Diagnosis not present

## 2024-05-31 DIAGNOSIS — Z79899 Other long term (current) drug therapy: Secondary | ICD-10-CM | POA: Diagnosis not present

## 2024-06-02 DIAGNOSIS — G8929 Other chronic pain: Secondary | ICD-10-CM | POA: Diagnosis not present

## 2024-06-02 DIAGNOSIS — M79642 Pain in left hand: Secondary | ICD-10-CM | POA: Diagnosis not present

## 2024-06-02 DIAGNOSIS — M25511 Pain in right shoulder: Secondary | ICD-10-CM | POA: Diagnosis not present

## 2024-06-02 DIAGNOSIS — M25512 Pain in left shoulder: Secondary | ICD-10-CM | POA: Diagnosis not present

## 2024-06-02 DIAGNOSIS — M79641 Pain in right hand: Secondary | ICD-10-CM | POA: Diagnosis not present

## 2024-06-07 DIAGNOSIS — Z79899 Other long term (current) drug therapy: Secondary | ICD-10-CM | POA: Diagnosis not present

## 2024-06-08 DIAGNOSIS — L24A2 Irritant contact dermatitis due to fecal, urinary or dual incontinence: Secondary | ICD-10-CM | POA: Diagnosis not present

## 2024-06-14 DIAGNOSIS — Z79899 Other long term (current) drug therapy: Secondary | ICD-10-CM | POA: Diagnosis not present

## 2024-06-15 DIAGNOSIS — L24A2 Irritant contact dermatitis due to fecal, urinary or dual incontinence: Secondary | ICD-10-CM | POA: Diagnosis not present

## 2024-06-16 DIAGNOSIS — N1832 Chronic kidney disease, stage 3b: Secondary | ICD-10-CM | POA: Diagnosis not present

## 2024-06-16 DIAGNOSIS — N390 Urinary tract infection, site not specified: Secondary | ICD-10-CM | POA: Diagnosis not present

## 2024-06-20 DIAGNOSIS — M25511 Pain in right shoulder: Secondary | ICD-10-CM | POA: Diagnosis not present

## 2024-06-20 DIAGNOSIS — M25512 Pain in left shoulder: Secondary | ICD-10-CM | POA: Diagnosis not present

## 2024-06-20 DIAGNOSIS — G8929 Other chronic pain: Secondary | ICD-10-CM | POA: Diagnosis not present

## 2024-06-20 DIAGNOSIS — M79641 Pain in right hand: Secondary | ICD-10-CM | POA: Diagnosis not present

## 2024-06-20 DIAGNOSIS — M79642 Pain in left hand: Secondary | ICD-10-CM | POA: Diagnosis not present

## 2024-06-21 DIAGNOSIS — Z79899 Other long term (current) drug therapy: Secondary | ICD-10-CM | POA: Diagnosis not present

## 2024-06-25 DIAGNOSIS — F322 Major depressive disorder, single episode, severe without psychotic features: Secondary | ICD-10-CM | POA: Diagnosis not present

## 2024-06-25 DIAGNOSIS — J449 Chronic obstructive pulmonary disease, unspecified: Secondary | ICD-10-CM | POA: Diagnosis not present

## 2024-06-25 DIAGNOSIS — F0390 Unspecified dementia without behavioral disturbance: Secondary | ICD-10-CM | POA: Diagnosis not present

## 2024-06-25 DIAGNOSIS — E1140 Type 2 diabetes mellitus with diabetic neuropathy, unspecified: Secondary | ICD-10-CM | POA: Diagnosis not present

## 2024-06-27 DIAGNOSIS — F411 Generalized anxiety disorder: Secondary | ICD-10-CM | POA: Diagnosis not present

## 2024-06-27 DIAGNOSIS — F32A Depression, unspecified: Secondary | ICD-10-CM | POA: Diagnosis not present

## 2024-06-27 DIAGNOSIS — F03A2 Unspecified dementia, mild, with psychotic disturbance: Secondary | ICD-10-CM | POA: Diagnosis not present

## 2024-06-28 DIAGNOSIS — Z79899 Other long term (current) drug therapy: Secondary | ICD-10-CM | POA: Diagnosis not present

## 2024-06-29 DIAGNOSIS — E1151 Type 2 diabetes mellitus with diabetic peripheral angiopathy without gangrene: Secondary | ICD-10-CM | POA: Diagnosis not present

## 2024-06-29 DIAGNOSIS — Z794 Long term (current) use of insulin: Secondary | ICD-10-CM | POA: Diagnosis not present

## 2024-06-29 DIAGNOSIS — L603 Nail dystrophy: Secondary | ICD-10-CM | POA: Diagnosis not present

## 2024-06-29 DIAGNOSIS — L84 Corns and callosities: Secondary | ICD-10-CM | POA: Diagnosis not present

## 2024-06-29 DIAGNOSIS — L602 Onychogryphosis: Secondary | ICD-10-CM | POA: Diagnosis not present

## 2024-07-05 DIAGNOSIS — Z79899 Other long term (current) drug therapy: Secondary | ICD-10-CM | POA: Diagnosis not present

## 2024-07-06 DIAGNOSIS — S71102A Unspecified open wound, left thigh, initial encounter: Secondary | ICD-10-CM | POA: Diagnosis not present

## 2024-07-06 DIAGNOSIS — S71101A Unspecified open wound, right thigh, initial encounter: Secondary | ICD-10-CM | POA: Diagnosis not present

## 2024-07-08 DIAGNOSIS — Z79899 Other long term (current) drug therapy: Secondary | ICD-10-CM | POA: Diagnosis not present

## 2024-07-12 DIAGNOSIS — Z79899 Other long term (current) drug therapy: Secondary | ICD-10-CM | POA: Diagnosis not present

## 2024-07-13 DIAGNOSIS — S71102A Unspecified open wound, left thigh, initial encounter: Secondary | ICD-10-CM | POA: Diagnosis not present

## 2024-07-13 DIAGNOSIS — S71101A Unspecified open wound, right thigh, initial encounter: Secondary | ICD-10-CM | POA: Diagnosis not present

## 2024-07-13 DIAGNOSIS — M25511 Pain in right shoulder: Secondary | ICD-10-CM | POA: Diagnosis not present

## 2024-07-13 DIAGNOSIS — M79642 Pain in left hand: Secondary | ICD-10-CM | POA: Diagnosis not present

## 2024-07-13 DIAGNOSIS — M79641 Pain in right hand: Secondary | ICD-10-CM | POA: Diagnosis not present

## 2024-07-13 DIAGNOSIS — M25512 Pain in left shoulder: Secondary | ICD-10-CM | POA: Diagnosis not present

## 2024-07-13 DIAGNOSIS — G8929 Other chronic pain: Secondary | ICD-10-CM | POA: Diagnosis not present

## 2024-07-19 DIAGNOSIS — H903 Sensorineural hearing loss, bilateral: Secondary | ICD-10-CM | POA: Diagnosis not present

## 2024-07-19 DIAGNOSIS — H9313 Tinnitus, bilateral: Secondary | ICD-10-CM | POA: Diagnosis not present

## 2024-07-19 DIAGNOSIS — Z79899 Other long term (current) drug therapy: Secondary | ICD-10-CM | POA: Diagnosis not present

## 2024-07-20 DIAGNOSIS — M79641 Pain in right hand: Secondary | ICD-10-CM | POA: Diagnosis not present

## 2024-07-20 DIAGNOSIS — G8929 Other chronic pain: Secondary | ICD-10-CM | POA: Diagnosis not present

## 2024-07-20 DIAGNOSIS — M79642 Pain in left hand: Secondary | ICD-10-CM | POA: Diagnosis not present

## 2024-07-20 DIAGNOSIS — M25512 Pain in left shoulder: Secondary | ICD-10-CM | POA: Diagnosis not present

## 2024-07-20 DIAGNOSIS — M25511 Pain in right shoulder: Secondary | ICD-10-CM | POA: Diagnosis not present

## 2024-07-26 DIAGNOSIS — Z79899 Other long term (current) drug therapy: Secondary | ICD-10-CM | POA: Diagnosis not present

## 2024-07-27 DIAGNOSIS — S71101A Unspecified open wound, right thigh, initial encounter: Secondary | ICD-10-CM | POA: Diagnosis not present

## 2024-07-28 DIAGNOSIS — E1165 Type 2 diabetes mellitus with hyperglycemia: Secondary | ICD-10-CM | POA: Diagnosis not present

## 2024-07-28 DIAGNOSIS — E039 Hypothyroidism, unspecified: Secondary | ICD-10-CM | POA: Diagnosis not present

## 2024-07-29 DIAGNOSIS — F0390 Unspecified dementia without behavioral disturbance: Secondary | ICD-10-CM | POA: Diagnosis not present

## 2024-07-29 DIAGNOSIS — J449 Chronic obstructive pulmonary disease, unspecified: Secondary | ICD-10-CM | POA: Diagnosis not present

## 2024-07-29 DIAGNOSIS — F322 Major depressive disorder, single episode, severe without psychotic features: Secondary | ICD-10-CM | POA: Diagnosis not present

## 2024-07-29 DIAGNOSIS — E1140 Type 2 diabetes mellitus with diabetic neuropathy, unspecified: Secondary | ICD-10-CM | POA: Diagnosis not present

## 2024-08-02 DIAGNOSIS — I10 Essential (primary) hypertension: Secondary | ICD-10-CM | POA: Diagnosis not present

## 2024-08-05 DIAGNOSIS — J449 Chronic obstructive pulmonary disease, unspecified: Secondary | ICD-10-CM | POA: Diagnosis not present

## 2024-08-05 DIAGNOSIS — F0390 Unspecified dementia without behavioral disturbance: Secondary | ICD-10-CM | POA: Diagnosis not present

## 2024-08-05 DIAGNOSIS — G5640 Causalgia of unspecified upper limb: Secondary | ICD-10-CM | POA: Diagnosis not present

## 2024-08-08 DIAGNOSIS — R4189 Other symptoms and signs involving cognitive functions and awareness: Secondary | ICD-10-CM | POA: Diagnosis not present

## 2024-08-08 DIAGNOSIS — G8929 Other chronic pain: Secondary | ICD-10-CM | POA: Diagnosis not present

## 2024-08-08 DIAGNOSIS — M6281 Muscle weakness (generalized): Secondary | ICD-10-CM | POA: Diagnosis not present

## 2024-08-09 DIAGNOSIS — R4189 Other symptoms and signs involving cognitive functions and awareness: Secondary | ICD-10-CM | POA: Diagnosis not present

## 2024-08-09 DIAGNOSIS — G8929 Other chronic pain: Secondary | ICD-10-CM | POA: Diagnosis not present

## 2024-08-09 DIAGNOSIS — M6281 Muscle weakness (generalized): Secondary | ICD-10-CM | POA: Diagnosis not present

## 2024-08-10 DIAGNOSIS — Z79899 Other long term (current) drug therapy: Secondary | ICD-10-CM | POA: Diagnosis not present

## 2024-08-10 DIAGNOSIS — M6281 Muscle weakness (generalized): Secondary | ICD-10-CM | POA: Diagnosis not present

## 2024-08-10 DIAGNOSIS — G8929 Other chronic pain: Secondary | ICD-10-CM | POA: Diagnosis not present

## 2024-08-10 DIAGNOSIS — R4189 Other symptoms and signs involving cognitive functions and awareness: Secondary | ICD-10-CM | POA: Diagnosis not present

## 2024-08-11 DIAGNOSIS — M6281 Muscle weakness (generalized): Secondary | ICD-10-CM | POA: Diagnosis not present

## 2024-08-11 DIAGNOSIS — G8929 Other chronic pain: Secondary | ICD-10-CM | POA: Diagnosis not present

## 2024-08-11 DIAGNOSIS — R4189 Other symptoms and signs involving cognitive functions and awareness: Secondary | ICD-10-CM | POA: Diagnosis not present

## 2024-08-12 DIAGNOSIS — M6281 Muscle weakness (generalized): Secondary | ICD-10-CM | POA: Diagnosis not present

## 2024-08-12 DIAGNOSIS — R4189 Other symptoms and signs involving cognitive functions and awareness: Secondary | ICD-10-CM | POA: Diagnosis not present

## 2024-08-12 DIAGNOSIS — G8929 Other chronic pain: Secondary | ICD-10-CM | POA: Diagnosis not present

## 2024-08-15 DIAGNOSIS — R4189 Other symptoms and signs involving cognitive functions and awareness: Secondary | ICD-10-CM | POA: Diagnosis not present

## 2024-08-15 DIAGNOSIS — G8929 Other chronic pain: Secondary | ICD-10-CM | POA: Diagnosis not present

## 2024-08-15 DIAGNOSIS — M6281 Muscle weakness (generalized): Secondary | ICD-10-CM | POA: Diagnosis not present

## 2024-08-16 DIAGNOSIS — E119 Type 2 diabetes mellitus without complications: Secondary | ICD-10-CM | POA: Diagnosis not present

## 2024-08-16 DIAGNOSIS — I5032 Chronic diastolic (congestive) heart failure: Secondary | ICD-10-CM | POA: Diagnosis not present

## 2024-08-16 DIAGNOSIS — I501 Left ventricular failure: Secondary | ICD-10-CM | POA: Diagnosis not present

## 2024-08-16 DIAGNOSIS — Z79899 Other long term (current) drug therapy: Secondary | ICD-10-CM | POA: Diagnosis not present

## 2024-08-16 DIAGNOSIS — I10 Essential (primary) hypertension: Secondary | ICD-10-CM | POA: Diagnosis not present

## 2024-08-17 DIAGNOSIS — M6281 Muscle weakness (generalized): Secondary | ICD-10-CM | POA: Diagnosis not present

## 2024-08-17 DIAGNOSIS — R4189 Other symptoms and signs involving cognitive functions and awareness: Secondary | ICD-10-CM | POA: Diagnosis not present

## 2024-08-17 DIAGNOSIS — G8929 Other chronic pain: Secondary | ICD-10-CM | POA: Diagnosis not present

## 2024-08-18 DIAGNOSIS — R4189 Other symptoms and signs involving cognitive functions and awareness: Secondary | ICD-10-CM | POA: Diagnosis not present

## 2024-08-18 DIAGNOSIS — G8929 Other chronic pain: Secondary | ICD-10-CM | POA: Diagnosis not present

## 2024-08-18 DIAGNOSIS — M6281 Muscle weakness (generalized): Secondary | ICD-10-CM | POA: Diagnosis not present

## 2024-08-22 DIAGNOSIS — G8929 Other chronic pain: Secondary | ICD-10-CM | POA: Diagnosis not present

## 2024-08-22 DIAGNOSIS — R4189 Other symptoms and signs involving cognitive functions and awareness: Secondary | ICD-10-CM | POA: Diagnosis not present

## 2024-08-22 DIAGNOSIS — M6281 Muscle weakness (generalized): Secondary | ICD-10-CM | POA: Diagnosis not present

## 2024-08-23 DIAGNOSIS — Z79899 Other long term (current) drug therapy: Secondary | ICD-10-CM | POA: Diagnosis not present

## 2024-08-25 DIAGNOSIS — M6281 Muscle weakness (generalized): Secondary | ICD-10-CM | POA: Diagnosis not present

## 2024-08-25 DIAGNOSIS — R4189 Other symptoms and signs involving cognitive functions and awareness: Secondary | ICD-10-CM | POA: Diagnosis not present

## 2024-08-25 DIAGNOSIS — G8929 Other chronic pain: Secondary | ICD-10-CM | POA: Diagnosis not present

## 2024-08-26 DIAGNOSIS — G8929 Other chronic pain: Secondary | ICD-10-CM | POA: Diagnosis not present

## 2024-08-26 DIAGNOSIS — M6281 Muscle weakness (generalized): Secondary | ICD-10-CM | POA: Diagnosis not present

## 2024-08-26 DIAGNOSIS — R4189 Other symptoms and signs involving cognitive functions and awareness: Secondary | ICD-10-CM | POA: Diagnosis not present

## 2024-08-29 DIAGNOSIS — F03A2 Unspecified dementia, mild, with psychotic disturbance: Secondary | ICD-10-CM | POA: Diagnosis not present

## 2024-08-29 DIAGNOSIS — F411 Generalized anxiety disorder: Secondary | ICD-10-CM | POA: Diagnosis not present

## 2024-08-29 DIAGNOSIS — F338 Other recurrent depressive disorders: Secondary | ICD-10-CM | POA: Diagnosis not present

## 2024-08-30 DIAGNOSIS — G6289 Other specified polyneuropathies: Secondary | ICD-10-CM | POA: Diagnosis not present

## 2024-08-30 DIAGNOSIS — L602 Onychogryphosis: Secondary | ICD-10-CM | POA: Diagnosis not present

## 2024-08-30 DIAGNOSIS — Z79899 Other long term (current) drug therapy: Secondary | ICD-10-CM | POA: Diagnosis not present

## 2024-08-30 DIAGNOSIS — L603 Nail dystrophy: Secondary | ICD-10-CM | POA: Diagnosis not present

## 2024-08-30 DIAGNOSIS — E1151 Type 2 diabetes mellitus with diabetic peripheral angiopathy without gangrene: Secondary | ICD-10-CM | POA: Diagnosis not present

## 2024-08-30 DIAGNOSIS — Z794 Long term (current) use of insulin: Secondary | ICD-10-CM | POA: Diagnosis not present

## 2024-09-01 DIAGNOSIS — Z79899 Other long term (current) drug therapy: Secondary | ICD-10-CM | POA: Diagnosis not present

## 2024-09-01 DIAGNOSIS — E039 Hypothyroidism, unspecified: Secondary | ICD-10-CM | POA: Diagnosis not present

## 2024-09-01 DIAGNOSIS — R8290 Unspecified abnormal findings in urine: Secondary | ICD-10-CM | POA: Diagnosis not present

## 2024-09-02 DIAGNOSIS — R4189 Other symptoms and signs involving cognitive functions and awareness: Secondary | ICD-10-CM | POA: Diagnosis not present

## 2024-09-02 DIAGNOSIS — M6281 Muscle weakness (generalized): Secondary | ICD-10-CM | POA: Diagnosis not present

## 2024-09-02 DIAGNOSIS — G8929 Other chronic pain: Secondary | ICD-10-CM | POA: Diagnosis not present

## 2024-09-02 DIAGNOSIS — R8281 Pyuria: Secondary | ICD-10-CM | POA: Diagnosis not present

## 2024-09-02 DIAGNOSIS — N3001 Acute cystitis with hematuria: Secondary | ICD-10-CM | POA: Diagnosis not present

## 2024-09-05 DIAGNOSIS — R4189 Other symptoms and signs involving cognitive functions and awareness: Secondary | ICD-10-CM | POA: Diagnosis not present

## 2024-09-05 DIAGNOSIS — G8929 Other chronic pain: Secondary | ICD-10-CM | POA: Diagnosis not present

## 2024-09-05 DIAGNOSIS — M6281 Muscle weakness (generalized): Secondary | ICD-10-CM | POA: Diagnosis not present

## 2024-09-06 DIAGNOSIS — Z79899 Other long term (current) drug therapy: Secondary | ICD-10-CM | POA: Diagnosis not present

## 2024-09-13 DIAGNOSIS — Z79899 Other long term (current) drug therapy: Secondary | ICD-10-CM | POA: Diagnosis not present

## 2024-09-16 DIAGNOSIS — J449 Chronic obstructive pulmonary disease, unspecified: Secondary | ICD-10-CM | POA: Diagnosis not present

## 2024-09-16 DIAGNOSIS — J9611 Chronic respiratory failure with hypoxia: Secondary | ICD-10-CM | POA: Diagnosis not present

## 2024-09-16 DIAGNOSIS — I5022 Chronic systolic (congestive) heart failure: Secondary | ICD-10-CM | POA: Diagnosis not present

## 2024-09-16 DIAGNOSIS — I5030 Unspecified diastolic (congestive) heart failure: Secondary | ICD-10-CM | POA: Diagnosis not present

## 2024-09-19 DIAGNOSIS — Z79899 Other long term (current) drug therapy: Secondary | ICD-10-CM | POA: Diagnosis not present

## 2024-10-04 DIAGNOSIS — Z79899 Other long term (current) drug therapy: Secondary | ICD-10-CM | POA: Diagnosis not present

## 2024-10-05 DIAGNOSIS — Z86711 Personal history of pulmonary embolism: Secondary | ICD-10-CM | POA: Diagnosis not present

## 2024-10-05 DIAGNOSIS — R0902 Hypoxemia: Secondary | ICD-10-CM | POA: Diagnosis not present

## 2024-10-05 DIAGNOSIS — J4540 Moderate persistent asthma, uncomplicated: Secondary | ICD-10-CM | POA: Diagnosis not present

## 2024-10-05 DIAGNOSIS — Z9981 Dependence on supplemental oxygen: Secondary | ICD-10-CM | POA: Diagnosis not present

## 2024-10-05 DIAGNOSIS — G4733 Obstructive sleep apnea (adult) (pediatric): Secondary | ICD-10-CM | POA: Diagnosis not present

## 2024-10-05 DIAGNOSIS — I272 Pulmonary hypertension, unspecified: Secondary | ICD-10-CM | POA: Diagnosis not present

## 2024-10-10 DIAGNOSIS — M6281 Muscle weakness (generalized): Secondary | ICD-10-CM | POA: Diagnosis not present

## 2024-10-10 DIAGNOSIS — F338 Other recurrent depressive disorders: Secondary | ICD-10-CM | POA: Diagnosis not present

## 2024-10-10 DIAGNOSIS — F03A2 Unspecified dementia, mild, with psychotic disturbance: Secondary | ICD-10-CM | POA: Diagnosis not present

## 2024-10-10 DIAGNOSIS — F411 Generalized anxiety disorder: Secondary | ICD-10-CM | POA: Diagnosis not present

## 2024-10-10 DIAGNOSIS — R278 Other lack of coordination: Secondary | ICD-10-CM | POA: Diagnosis not present

## 2024-10-12 DIAGNOSIS — Z79899 Other long term (current) drug therapy: Secondary | ICD-10-CM | POA: Diagnosis not present

## 2024-10-13 DIAGNOSIS — R059 Cough, unspecified: Secondary | ICD-10-CM | POA: Diagnosis not present

## 2024-10-13 DIAGNOSIS — R079 Chest pain, unspecified: Secondary | ICD-10-CM | POA: Diagnosis not present

## 2024-10-13 DIAGNOSIS — R278 Other lack of coordination: Secondary | ICD-10-CM | POA: Diagnosis not present

## 2024-10-17 DIAGNOSIS — R278 Other lack of coordination: Secondary | ICD-10-CM | POA: Diagnosis not present

## 2024-10-17 DIAGNOSIS — M6281 Muscle weakness (generalized): Secondary | ICD-10-CM | POA: Diagnosis not present

## 2024-10-18 DIAGNOSIS — M6281 Muscle weakness (generalized): Secondary | ICD-10-CM | POA: Diagnosis not present

## 2024-10-18 DIAGNOSIS — R278 Other lack of coordination: Secondary | ICD-10-CM | POA: Diagnosis not present

## 2024-10-20 DIAGNOSIS — R278 Other lack of coordination: Secondary | ICD-10-CM | POA: Diagnosis not present

## 2024-10-25 ENCOUNTER — Encounter: Payer: Self-pay | Admitting: Neurology

## 2024-10-25 DIAGNOSIS — M6281 Muscle weakness (generalized): Secondary | ICD-10-CM | POA: Diagnosis not present

## 2024-10-25 DIAGNOSIS — R278 Other lack of coordination: Secondary | ICD-10-CM | POA: Diagnosis not present

## 2024-11-01 DIAGNOSIS — Z79899 Other long term (current) drug therapy: Secondary | ICD-10-CM | POA: Diagnosis not present

## 2024-11-02 DIAGNOSIS — M6281 Muscle weakness (generalized): Secondary | ICD-10-CM | POA: Diagnosis not present

## 2024-11-07 DIAGNOSIS — N390 Urinary tract infection, site not specified: Secondary | ICD-10-CM | POA: Diagnosis not present

## 2024-11-07 DIAGNOSIS — B964 Proteus (mirabilis) (morganii) as the cause of diseases classified elsewhere: Secondary | ICD-10-CM | POA: Diagnosis not present

## 2024-11-08 DIAGNOSIS — E1169 Type 2 diabetes mellitus with other specified complication: Secondary | ICD-10-CM | POA: Diagnosis not present

## 2024-11-08 DIAGNOSIS — E1140 Type 2 diabetes mellitus with diabetic neuropathy, unspecified: Secondary | ICD-10-CM | POA: Diagnosis not present

## 2024-11-10 DIAGNOSIS — E119 Type 2 diabetes mellitus without complications: Secondary | ICD-10-CM | POA: Diagnosis not present

## 2024-11-10 DIAGNOSIS — Z961 Presence of intraocular lens: Secondary | ICD-10-CM | POA: Diagnosis not present

## 2024-11-10 DIAGNOSIS — H401134 Primary open-angle glaucoma, bilateral, indeterminate stage: Secondary | ICD-10-CM | POA: Diagnosis not present

## 2024-11-16 DIAGNOSIS — L602 Onychogryphosis: Secondary | ICD-10-CM | POA: Diagnosis not present

## 2024-11-16 DIAGNOSIS — E1151 Type 2 diabetes mellitus with diabetic peripheral angiopathy without gangrene: Secondary | ICD-10-CM | POA: Diagnosis not present

## 2024-11-16 DIAGNOSIS — Z794 Long term (current) use of insulin: Secondary | ICD-10-CM | POA: Diagnosis not present

## 2024-11-16 DIAGNOSIS — L603 Nail dystrophy: Secondary | ICD-10-CM | POA: Diagnosis not present

## 2025-02-01 ENCOUNTER — Ambulatory Visit: Payer: Self-pay | Admitting: Neurology
